# Patient Record
Sex: Female | Born: 1935 | Race: White | Marital: Married | Smoking: Never smoker
Health system: Southern US, Community
[De-identification: ages and names within clinical notes are randomized; demographics above are authoritative.]

## PROBLEM LIST (undated history)

## (undated) DIAGNOSIS — I509 Heart failure, unspecified: Secondary | ICD-10-CM

## (undated) DIAGNOSIS — F0390 Unspecified dementia without behavioral disturbance: Secondary | ICD-10-CM

## (undated) DIAGNOSIS — I4891 Unspecified atrial fibrillation: Secondary | ICD-10-CM

## (undated) HISTORY — PX: ABDOMINAL HYSTERECTOMY: SHX81

---

## 2004-07-10 ENCOUNTER — Emergency Department: Payer: Self-pay | Admitting: Emergency Medicine

## 2004-09-20 ENCOUNTER — Ambulatory Visit: Payer: Self-pay | Admitting: Internal Medicine

## 2005-10-12 ENCOUNTER — Ambulatory Visit: Payer: Self-pay | Admitting: Internal Medicine

## 2006-08-23 ENCOUNTER — Ambulatory Visit: Payer: Self-pay | Admitting: Ophthalmology

## 2006-10-16 ENCOUNTER — Ambulatory Visit: Payer: Self-pay | Admitting: Internal Medicine

## 2006-12-27 ENCOUNTER — Ambulatory Visit: Payer: Self-pay | Admitting: Ophthalmology

## 2011-08-05 ENCOUNTER — Emergency Department: Payer: Self-pay | Admitting: Emergency Medicine

## 2011-08-05 LAB — CBC
HCT: 39.3 % (ref 35.0–47.0)
MCV: 88 fL (ref 80–100)
Platelet: 140 10*3/uL — ABNORMAL LOW (ref 150–440)
RBC: 4.48 10*6/uL (ref 3.80–5.20)
RDW: 13.2 % (ref 11.5–14.5)

## 2011-08-05 LAB — URINALYSIS, COMPLETE
Bilirubin,UR: NEGATIVE
Nitrite: NEGATIVE
Ph: 5 (ref 4.5–8.0)
Protein: NEGATIVE
RBC,UR: 9 /HPF (ref 0–5)
Specific Gravity: 1.014 (ref 1.003–1.030)
WBC UR: 15 /HPF (ref 0–5)

## 2011-08-05 LAB — COMPREHENSIVE METABOLIC PANEL
Albumin: 3.7 g/dL (ref 3.4–5.0)
Alkaline Phosphatase: 79 U/L (ref 50–136)
Anion Gap: 12 (ref 7–16)
BUN: 13 mg/dL (ref 7–18)
Bilirubin,Total: 0.7 mg/dL (ref 0.2–1.0)
Calcium, Total: 8.5 mg/dL (ref 8.5–10.1)
Chloride: 96 mmol/L — ABNORMAL LOW (ref 98–107)
Co2: 28 mmol/L (ref 21–32)
Creatinine: 0.89 mg/dL (ref 0.60–1.30)
EGFR (African American): >60
EGFR (Non-African Amer.): >60
Osmolality: 278 (ref 275–301)
Potassium: 3.1 mmol/L — ABNORMAL LOW (ref 3.5–5.1)
SGPT (ALT): 35 U/L
Sodium: 136 mmol/L (ref 136–145)
Total Protein: 6.8 g/dL (ref 6.4–8.2)

## 2011-08-05 LAB — MAGNESIUM: Magnesium: 1.3 mg/dL — ABNORMAL LOW

## 2011-08-05 LAB — TSH: Thyroid Stimulating Horm: 1.08 u[IU]/mL

## 2011-08-07 LAB — URINE CULTURE

## 2011-09-28 ENCOUNTER — Ambulatory Visit: Payer: Self-pay | Admitting: Cardiology

## 2011-09-28 DIAGNOSIS — I4891 Unspecified atrial fibrillation: Secondary | ICD-10-CM

## 2011-09-28 LAB — BASIC METABOLIC PANEL
Chloride: 100 mmol/L (ref 98–107)
Creatinine: 0.75 mg/dL (ref 0.60–1.30)
EGFR (Non-African Amer.): >60
Glucose: 91 mg/dL (ref 65–99)
Potassium: 4.1 mmol/L (ref 3.5–5.1)
Sodium: 136 mmol/L (ref 136–145)

## 2011-09-28 LAB — CBC WITH DIFFERENTIAL/PLATELET
Basophil #: 0.1 10*3/uL (ref 0.0–0.1)
Basophil %: 1 %
Eosinophil #: 0.1 10*3/uL (ref 0.0–0.7)
HCT: 36.6 % (ref 35.0–47.0)
HGB: 12.2 g/dL (ref 12.0–16.0)
Lymphocyte #: 1.2 10*3/uL (ref 1.0–3.6)
Lymphocyte %: 22.9 %
MCH: 29 pg (ref 26.0–34.0)
Monocyte #: 0.6 x10 3/mm (ref 0.2–0.9)
Monocyte %: 12 %
WBC: 5.3 10*3/uL (ref 3.6–11.0)

## 2011-09-28 LAB — PROTIME-INR: Prothrombin Time: 15.7 secs — ABNORMAL HIGH (ref 11.5–14.7)

## 2011-10-05 ENCOUNTER — Ambulatory Visit: Payer: Self-pay | Admitting: Cardiology

## 2011-10-05 LAB — PROTIME-INR: INR: 1

## 2013-09-11 ENCOUNTER — Emergency Department: Payer: Self-pay | Admitting: Emergency Medicine

## 2014-03-20 DIAGNOSIS — I10 Essential (primary) hypertension: Secondary | ICD-10-CM | POA: Diagnosis present

## 2014-08-31 NOTE — Op Note (Signed)
PATIENT NAME:  Kristen MaclachlanCOOK, [name] MR#:  [id number] DATE OF BIRTH:  1926/09/05  DATE OF PROCEDURE:  10/05/2011  PRIMARY CARE PHYSICIAN: Dr. Bethann PunchesMark Miller   PREPROCEDURE DIAGNOSES: Sick sinus syndrome, atrial fibrillation.   PROCEDURE: Single-chamber pacemaker implantation.   POSTPROCEDURE DIAGNOSIS: Intermittent ventricular pacing.   SURGEON: Marcina MillardAlexander Janyla Biscoe, MD  INDICATION: Patient is an 81 year old female with chronic atrial fibrillation. Patient recently has been experiencing exertional shortness of breath, fatigue, and weakness. The patient was noted to be intermittently bradycardic with atrial fibrillation alternating with atrial fibrillation with rapid ventricular response. Holter monitor has shown bradycardia with rates in the 40s with long RR intervals and pulses up to 2.5 seconds alternating with atrial fibrillation with a rapid rate. Procedure, risks, benefits, and alternatives of permanent pacemaker implantation were explained to the patient and informed written consent was obtained.   DESCRIPTION OF PROCEDURE: She was brought to the Operating Room in a fasting state. The left pectoral region was prepped and draped in the usual sterile manner. Anesthesia was obtained with 1% Xylocaine locally. A 6 cm incision was performed over the left pectoral region. The pacemaker pocket was generated by electrocautery and blunt dissection. Access was obtained to the left subclavian vein by fine needle aspiration. Ventricular and atrial leads were positioned into the right ventricular apical septum and right atrial appendage. After proper thresholds were obtained, the leads were sutured in place. The pacemaker pocket was irrigated with gentamicin solution. The leads were connected to a dual chamber rate responsive pacemaker generator and positioned into the pocket. The pocket was closed with 2-0 and 4-0 Vicryl, respectively. Steri-Strips and pressure dressing were applied.    ____________________________ Marcina MillardAlexander Bevin Mayall, MD ap:cms D: 10/05/2011 13:13:49 ET T: 10/05/2011 13:26:16 ET  JOB#: [id number] cc: Marcina MillardAlexander Dorse Locy, MD, <Dictator> Marcina MillardALEXANDER Kylah Maresh MD ELECTRONICALLY SIGNED 10/20/2011 17:40

## 2018-04-17 DIAGNOSIS — I4891 Unspecified atrial fibrillation: Secondary | ICD-10-CM | POA: Diagnosis present

## 2018-04-17 DIAGNOSIS — F0280 Dementia in other diseases classified elsewhere without behavioral disturbance: Secondary | ICD-10-CM | POA: Diagnosis present

## 2018-04-17 DIAGNOSIS — G309 Alzheimer's disease, unspecified: Secondary | ICD-10-CM | POA: Diagnosis present

## 2018-04-17 DIAGNOSIS — I4892 Unspecified atrial flutter: Secondary | ICD-10-CM | POA: Diagnosis present

## 2018-04-17 DIAGNOSIS — I482 Chronic atrial fibrillation, unspecified: Secondary | ICD-10-CM | POA: Diagnosis present

## 2018-10-17 DIAGNOSIS — Z950 Presence of cardiac pacemaker: Secondary | ICD-10-CM | POA: Diagnosis present

## 2018-10-17 DIAGNOSIS — I739 Peripheral vascular disease, unspecified: Secondary | ICD-10-CM | POA: Diagnosis present

## 2019-11-01 ENCOUNTER — Emergency Department: Payer: Medicare Other

## 2019-11-01 ENCOUNTER — Other Ambulatory Visit: Payer: Self-pay

## 2019-11-01 ENCOUNTER — Emergency Department
Admission: EM | Admit: 2019-11-01 | Discharge: 2019-11-01 | Disposition: A | Discharge status: Home / Self Care | Payer: Medicare Other | Attending: Student in an Organized Health Care Education/Training Program | Admitting: Student in an Organized Health Care Education/Training Program

## 2019-11-01 DIAGNOSIS — Y999 Unspecified external cause status: Secondary | ICD-10-CM | POA: Insufficient documentation

## 2019-11-01 DIAGNOSIS — S0181XA Laceration without foreign body of other part of head, initial encounter: Secondary | ICD-10-CM | POA: Diagnosis not present

## 2019-11-01 DIAGNOSIS — W2209XA Striking against other stationary object, initial encounter: Secondary | ICD-10-CM | POA: Diagnosis not present

## 2019-11-01 DIAGNOSIS — S51012A Laceration without foreign body of left elbow, initial encounter: Secondary | ICD-10-CM | POA: Diagnosis not present

## 2019-11-01 DIAGNOSIS — Y92009 Unspecified place in unspecified non-institutional (private) residence as the place of occurrence of the external cause: Secondary | ICD-10-CM | POA: Diagnosis not present

## 2019-11-01 DIAGNOSIS — Z23 Encounter for immunization: Secondary | ICD-10-CM | POA: Insufficient documentation

## 2019-11-01 DIAGNOSIS — Y939 Activity, unspecified: Secondary | ICD-10-CM | POA: Diagnosis not present

## 2019-11-01 DIAGNOSIS — S0990XA Unspecified injury of head, initial encounter: Secondary | ICD-10-CM | POA: Diagnosis present

## 2019-11-01 MED ORDER — LIDOCAINE HCL (PF) 1 % IJ SOLN
5.0000 mL | Freq: Once | INTRAMUSCULAR | Status: AC
  Administered 2019-11-01: 5 mL via INTRADERMAL
  Filled 2019-11-01: qty 5

## 2019-11-01 MED ORDER — LIDOCAINE-EPINEPHRINE-TETRACAINE (LET) TOPICAL GEL
3.0000 mL | Freq: Once | TOPICAL | Status: AC
  Administered 2019-11-01: 3 mL via TOPICAL
  Filled 2019-11-01: qty 3

## 2019-11-01 MED ORDER — BACITRACIN-NEOMYCIN-POLYMYXIN 400-5-5000 EX OINT
TOPICAL_OINTMENT | Freq: Once | CUTANEOUS | Status: AC
  Administered 2019-11-01: 2 via TOPICAL
  Filled 2019-11-01: qty 2

## 2019-11-01 MED ORDER — TETANUS-DIPHTH-ACELL PERTUSSIS 5-2.5-18.5 LF-MCG/0.5 IM SUSP
0.5000 mL | Freq: Once | INTRAMUSCULAR | Status: AC
Start: 2019-11-01 — End: 2019-11-01
  Administered 2019-11-01: 0.5 mL via INTRAMUSCULAR
  Filled 2019-11-01: qty 0.5

## 2019-11-01 NOTE — ED Triage Notes (Signed)
Pt states that she was moving too fast going from one room to another and fell hitting her head on the floor, denies loc or blood thinner usage, pt has a lac to the left forehead area. Denies neck pain

## 2019-11-01 NOTE — ED Provider Notes (Signed)
[facility] Emergency Department Provider Note    First MD Initiated Contact with Patient 11/01/19 1918     (approximate)  I have reviewed the triage vital signs and the nursing notes.   HISTORY  Chief Complaint Fall, Head Injury, and Laceration    HPI Kristen Osborn is a 86 y.o. female significant past medical history presents to the ER after mechanical fall states that she was trying to move too fast and lost her footing causing her to fall.  She struck her forehead.  No LOC.  Denies any other pain or discomfort.  No neck pain, numbness or tingling.   Not on blood thinners.   No past medical history on file. No family history on file.  There are no problems to display for this patient.     Prior to Admission medications   Not on File    Allergies Patient has no known allergies.    Social History Social History   Tobacco Use  . Smoking status: Not on file  Substance Use Topics  . Alcohol use: Not on file  . Drug use: Not on file    Review of Systems Patient denies headaches, rhinorrhea, blurry vision, numbness, shortness of breath, chest pain, edema, cough, abdominal pain, nausea, vomiting, diarrhea, dysuria, fevers, rashes or hallucinations unless otherwise stated above in HPI. ____________________________________________   PHYSICAL EXAM:  VITAL SIGNS: Vitals:   11/01/19 1657  BP: (!) 197/82  Pulse: 72  Resp: 17  Temp: 97.9 F (36.6 C)  SpO2: 96%    Constitutional: Alert and oriented.  Eyes: Conjunctivae are normal.  Head: 2cm corner laceration above left eyebrow, no FB,  hemostatic Nose: No congestion/rhinnorhea. Mouth/Throat: Mucous membranes are moist.   Neck: No stridor. Painless ROM.  Cardiovascular: Normal rate, regular rhythm. Grossly normal heart sounds.  Good peripheral circulation. Respiratory: Normal respiratory effort.  No retractions. Lungs CTAB. Gastrointestinal: Soft and nontender. No distention. No  abdominal bruits. No CVA tenderness. Genitourinary:  Musculoskeletal: No lower extremity tenderness nor edema.  No joint effusions. Neurologic:  Normal speech and language. No gross focal neurologic deficits are appreciated. No facial droop Skin:  Skin is warm, dry.  0.5cm laceration of the olecranon of the right elbow it is full-thickness.  On careful inspection does not seem to involve joint capsule Psychiatric: Mood and affect are normal. Speech and behavior are normal.  ____________________________________________   LABS (all labs ordered are listed, but only abnormal results are displayed)  No results found for this or any previous visit (from the past 24 hour(s)). ____________________________________________ ____________________________________________  [email or web address]  I personally reviewed all radiographic images ordered to evaluate for the above acute complaints and reviewed radiology reports and findings.  These findings were personally discussed with the patient.  Please see medical record for radiology report.  ____________________________________________   PROCEDURES  Procedure(s) performed:  Marland KitchenMarland KitchenLaceration Repair  Date/Time: 11/01/2019 9:01 PM Performed by: Merlyn Lot, MD Authorized by: Merlyn Lot, MD   Consent:    Consent obtained:  Verbal   Consent given by:  Patient   Risks discussed:  Infection, pain, retained foreign body, poor cosmetic result and poor wound healing Anesthesia (see MAR for exact dosages):    Anesthesia method:  Local infiltration and topical application   Topical anesthetic:  LET   Local anesthetic:  Lidocaine 1% w/o epi Laceration details:    Location: 2cm corner laceration above left eyebrow, 0.5cm lac to right elbow. Repair type:    Repair type:  Simple Exploration:    Hemostasis achieved with:  Direct pressure   Wound exploration: entire depth of wound probed and visualized     Contaminated: no   Treatment:    Area cleansed  with:  Saline and Betadine   Amount of cleaning:  Extensive   Irrigation solution:  Sterile saline   Visualized foreign bodies/material removed: no   Skin repair:    Repair method:  Sutures   Suture size:  5-0   Number of sutures: right elbow: 1.  left forehead: 4. Approximation:    Approximation:  Close Post-procedure details:    Dressing:  Sterile dressing and antibiotic ointment   Patient tolerance of procedure:  Tolerated well, no immediate complications      Critical Care performed: no ____________________________________________   INITIAL IMPRESSION / ASSESSMENT AND PLAN / ED COURSE  Pertinent labs & imaging results that were available during my care of the patient were reviewed by me and considered in my medical decision making (see chart for details).   DDX: laceration, contusion, concussion, ich, fracture  Kristen Osborn is a 86 y.o. who presents to the ED with injuries as described above.  Extensive wound cleaning performed and laceration repaired as above.  Imaging fortunately is reassuring.  History consistent with mechanical fall.  This was witnessed by family member who is currently at bedside.  Tetanus is updated.  Patient well and nontoxic.  Requesting discharge home at that that is appropriate.     The patient was evaluated in Emergency Department today for the symptoms described in the history of present illness. He/she was evaluated in the context of the global COVID-19 pandemic, which necessitated consideration that the patient might be at risk for infection with the SARS-CoV-2 virus that causes COVID-19. Institutional protocols and algorithms that pertain to the evaluation of patients at risk for COVID-19 are in a state of rapid change based on information released by regulatory bodies including the CDC and federal and state organizations. These policies and algorithms were followed during the patient's care in the ED.  As part of my medical decision making, I  reviewed the following data within the electronic MEDICAL RECORD NUMBER Nursing notes reviewed and incorporated, Labs reviewed, notes from prior ED visits and [place] Controlled Substance Database   ____________________________________________   FINAL CLINICAL IMPRESSION(S) / ED DIAGNOSES  Final diagnoses:  Forehead laceration, initial encounter  Elbow laceration, left, initial encounter      NEW MEDICATIONS STARTED DURING THIS VISIT:  New Prescriptions   No medications on file     Note:  This document was prepared using Dragon voice recognition software and may include unintentional dictation errors.    Willy Eddy, MD 11/01/19 2103

## 2020-06-24 ENCOUNTER — Emergency Department: Payer: Medicare Other

## 2020-06-24 ENCOUNTER — Other Ambulatory Visit: Payer: Self-pay

## 2020-06-24 ENCOUNTER — Inpatient Hospital Stay
Admission: EM | Admit: 2020-06-24 | Discharge: 2020-07-06 | DRG: 521 | Disposition: A | Discharge status: Skilled Nursing Facility | Payer: Medicare Other | Attending: Internal Medicine | Admitting: Internal Medicine

## 2020-06-24 DIAGNOSIS — I4891 Unspecified atrial fibrillation: Secondary | ICD-10-CM | POA: Diagnosis present

## 2020-06-24 DIAGNOSIS — I110 Hypertensive heart disease with heart failure: Secondary | ICD-10-CM | POA: Diagnosis present

## 2020-06-24 DIAGNOSIS — Z79899 Other long term (current) drug therapy: Secondary | ICD-10-CM

## 2020-06-24 DIAGNOSIS — Z950 Presence of cardiac pacemaker: Secondary | ICD-10-CM

## 2020-06-24 DIAGNOSIS — Z7901 Long term (current) use of anticoagulants: Secondary | ICD-10-CM

## 2020-06-24 DIAGNOSIS — G309 Alzheimer's disease, unspecified: Secondary | ICD-10-CM | POA: Diagnosis present

## 2020-06-24 DIAGNOSIS — R339 Retention of urine, unspecified: Secondary | ICD-10-CM | POA: Diagnosis not present

## 2020-06-24 DIAGNOSIS — D638 Anemia in other chronic diseases classified elsewhere: Secondary | ICD-10-CM | POA: Diagnosis present

## 2020-06-24 DIAGNOSIS — T8189XA Other complications of procedures, not elsewhere classified, initial encounter: Secondary | ICD-10-CM | POA: Diagnosis not present

## 2020-06-24 DIAGNOSIS — E222 Syndrome of inappropriate secretion of antidiuretic hormone: Secondary | ICD-10-CM | POA: Diagnosis not present

## 2020-06-24 DIAGNOSIS — I480 Paroxysmal atrial fibrillation: Secondary | ICD-10-CM | POA: Diagnosis present

## 2020-06-24 DIAGNOSIS — S72001A Fracture of unspecified part of neck of right femur, initial encounter for closed fracture: Secondary | ICD-10-CM | POA: Diagnosis not present

## 2020-06-24 DIAGNOSIS — S52501A Unspecified fracture of the lower end of right radius, initial encounter for closed fracture: Secondary | ICD-10-CM | POA: Diagnosis present

## 2020-06-24 DIAGNOSIS — Z888 Allergy status to other drugs, medicaments and biological substances status: Secondary | ICD-10-CM

## 2020-06-24 DIAGNOSIS — F0280 Dementia in other diseases classified elsewhere without behavioral disturbance: Secondary | ICD-10-CM | POA: Diagnosis present

## 2020-06-24 DIAGNOSIS — I739 Peripheral vascular disease, unspecified: Secondary | ICD-10-CM | POA: Diagnosis present

## 2020-06-24 DIAGNOSIS — S72011A Unspecified intracapsular fracture of right femur, initial encounter for closed fracture: Principal | ICD-10-CM | POA: Diagnosis present

## 2020-06-24 DIAGNOSIS — Z681 Body mass index (BMI) 19 or less, adult: Secondary | ICD-10-CM

## 2020-06-24 DIAGNOSIS — W1830XA Fall on same level, unspecified, initial encounter: Secondary | ICD-10-CM | POA: Diagnosis present

## 2020-06-24 DIAGNOSIS — I4892 Unspecified atrial flutter: Secondary | ICD-10-CM | POA: Diagnosis present

## 2020-06-24 DIAGNOSIS — E43 Unspecified severe protein-calorie malnutrition: Secondary | ICD-10-CM | POA: Diagnosis present

## 2020-06-24 DIAGNOSIS — T148XXA Other injury of unspecified body region, initial encounter: Secondary | ICD-10-CM

## 2020-06-24 DIAGNOSIS — Y92009 Unspecified place in unspecified non-institutional (private) residence as the place of occurrence of the external cause: Secondary | ICD-10-CM

## 2020-06-24 DIAGNOSIS — Z20822 Contact with and (suspected) exposure to covid-19: Secondary | ICD-10-CM | POA: Diagnosis present

## 2020-06-24 DIAGNOSIS — R54 Age-related physical debility: Secondary | ICD-10-CM | POA: Diagnosis present

## 2020-06-24 DIAGNOSIS — W19XXXA Unspecified fall, initial encounter: Secondary | ICD-10-CM

## 2020-06-24 DIAGNOSIS — I5032 Chronic diastolic (congestive) heart failure: Secondary | ICD-10-CM | POA: Diagnosis present

## 2020-06-24 DIAGNOSIS — R451 Restlessness and agitation: Secondary | ICD-10-CM | POA: Diagnosis present

## 2020-06-24 DIAGNOSIS — D62 Acute posthemorrhagic anemia: Secondary | ICD-10-CM | POA: Diagnosis not present

## 2020-06-24 DIAGNOSIS — I10 Essential (primary) hypertension: Secondary | ICD-10-CM | POA: Diagnosis present

## 2020-06-24 DIAGNOSIS — Z66 Do not resuscitate: Secondary | ICD-10-CM | POA: Diagnosis present

## 2020-06-24 DIAGNOSIS — Z01818 Encounter for other preprocedural examination: Secondary | ICD-10-CM

## 2020-06-24 DIAGNOSIS — I482 Chronic atrial fibrillation, unspecified: Secondary | ICD-10-CM | POA: Diagnosis present

## 2020-06-24 HISTORY — DX: Heart failure, unspecified: I50.9

## 2020-06-24 HISTORY — DX: Unspecified dementia, unspecified severity, without behavioral disturbance, psychotic disturbance, mood disturbance, and anxiety: F03.90

## 2020-06-24 HISTORY — DX: Unspecified atrial fibrillation: I48.91

## 2020-06-24 MED ORDER — DILTIAZEM HCL 25 MG/5ML IV SOLN
10.0000 mg | Freq: Once | INTRAVENOUS | Status: AC
  Administered 2020-06-25: 10 mg via INTRAVENOUS
  Filled 2020-06-24: qty 5

## 2020-06-24 MED ORDER — FENTANYL CITRATE (PF) 100 MCG/2ML IJ SOLN
50.0000 ug | Freq: Once | INTRAMUSCULAR | Status: AC
  Administered 2020-06-25: 50 ug via INTRAVENOUS
  Filled 2020-06-24: qty 2

## 2020-06-24 NOTE — ED Notes (Signed)
Patient 88-92% on RA. Per family member at bedside, does not wear oxygen at home. Placed on 2L via [place] with improvement to 94%.

## 2020-06-24 NOTE — ED Triage Notes (Signed)
Pt is a 87 y/o female presenting via EMS from home with a hx of Dementia presenting after a fall tonight. Per MEDIC, R wrist and R hip deformity noted. administered and relief noted.

## 2020-06-25 ENCOUNTER — Encounter: Payer: Self-pay | Admitting: Emergency Medicine

## 2020-06-25 ENCOUNTER — Emergency Department: Payer: Medicare Other

## 2020-06-25 DIAGNOSIS — E222 Syndrome of inappropriate secretion of antidiuretic hormone: Secondary | ICD-10-CM | POA: Diagnosis not present

## 2020-06-25 DIAGNOSIS — D62 Acute posthemorrhagic anemia: Secondary | ICD-10-CM | POA: Diagnosis not present

## 2020-06-25 DIAGNOSIS — I110 Hypertensive heart disease with heart failure: Secondary | ICD-10-CM | POA: Diagnosis present

## 2020-06-25 DIAGNOSIS — Z01818 Encounter for other preprocedural examination: Secondary | ICD-10-CM

## 2020-06-25 DIAGNOSIS — I5032 Chronic diastolic (congestive) heart failure: Secondary | ICD-10-CM | POA: Diagnosis present

## 2020-06-25 DIAGNOSIS — D638 Anemia in other chronic diseases classified elsewhere: Secondary | ICD-10-CM | POA: Diagnosis present

## 2020-06-25 DIAGNOSIS — Z950 Presence of cardiac pacemaker: Secondary | ICD-10-CM | POA: Diagnosis not present

## 2020-06-25 DIAGNOSIS — F0280 Dementia in other diseases classified elsewhere without behavioral disturbance: Secondary | ICD-10-CM | POA: Diagnosis present

## 2020-06-25 DIAGNOSIS — Y92009 Unspecified place in unspecified non-institutional (private) residence as the place of occurrence of the external cause: Secondary | ICD-10-CM | POA: Diagnosis not present

## 2020-06-25 DIAGNOSIS — G309 Alzheimer's disease, unspecified: Secondary | ICD-10-CM | POA: Diagnosis present

## 2020-06-25 DIAGNOSIS — Z515 Encounter for palliative care: Secondary | ICD-10-CM | POA: Diagnosis not present

## 2020-06-25 DIAGNOSIS — Z681 Body mass index (BMI) 19 or less, adult: Secondary | ICD-10-CM | POA: Diagnosis not present

## 2020-06-25 DIAGNOSIS — Z20822 Contact with and (suspected) exposure to covid-19: Secondary | ICD-10-CM | POA: Diagnosis present

## 2020-06-25 DIAGNOSIS — S72001A Fracture of unspecified part of neck of right femur, initial encounter for closed fracture: Secondary | ICD-10-CM | POA: Insufficient documentation

## 2020-06-25 DIAGNOSIS — R339 Retention of urine, unspecified: Secondary | ICD-10-CM | POA: Diagnosis not present

## 2020-06-25 DIAGNOSIS — W1830XA Fall on same level, unspecified, initial encounter: Secondary | ICD-10-CM | POA: Diagnosis present

## 2020-06-25 DIAGNOSIS — Z7901 Long term (current) use of anticoagulants: Secondary | ICD-10-CM

## 2020-06-25 DIAGNOSIS — I4892 Unspecified atrial flutter: Secondary | ICD-10-CM | POA: Diagnosis present

## 2020-06-25 DIAGNOSIS — S52501A Unspecified fracture of the lower end of right radius, initial encounter for closed fracture: Secondary | ICD-10-CM

## 2020-06-25 DIAGNOSIS — Z79899 Other long term (current) drug therapy: Secondary | ICD-10-CM | POA: Diagnosis not present

## 2020-06-25 DIAGNOSIS — R451 Restlessness and agitation: Secondary | ICD-10-CM | POA: Diagnosis present

## 2020-06-25 DIAGNOSIS — Z66 Do not resuscitate: Secondary | ICD-10-CM | POA: Diagnosis present

## 2020-06-25 DIAGNOSIS — Z888 Allergy status to other drugs, medicaments and biological substances status: Secondary | ICD-10-CM | POA: Diagnosis not present

## 2020-06-25 DIAGNOSIS — I739 Peripheral vascular disease, unspecified: Secondary | ICD-10-CM | POA: Diagnosis present

## 2020-06-25 DIAGNOSIS — Z7189 Other specified counseling: Secondary | ICD-10-CM | POA: Diagnosis not present

## 2020-06-25 DIAGNOSIS — S72011A Unspecified intracapsular fracture of right femur, initial encounter for closed fracture: Secondary | ICD-10-CM | POA: Diagnosis present

## 2020-06-25 DIAGNOSIS — I480 Paroxysmal atrial fibrillation: Secondary | ICD-10-CM | POA: Diagnosis present

## 2020-06-25 DIAGNOSIS — T8189XA Other complications of procedures, not elsewhere classified, initial encounter: Secondary | ICD-10-CM | POA: Diagnosis not present

## 2020-06-25 DIAGNOSIS — R54 Age-related physical debility: Secondary | ICD-10-CM | POA: Diagnosis present

## 2020-06-25 DIAGNOSIS — E43 Unspecified severe protein-calorie malnutrition: Secondary | ICD-10-CM | POA: Diagnosis present

## 2020-06-25 LAB — CBC WITH DIFFERENTIAL/PLATELET
Abs Immature Granulocytes: 0.01 10*3/uL (ref 0.00–0.07)
Basophils Absolute: 0.1 10*3/uL (ref 0.0–0.1)
Basophils Relative: 1 %
Eosinophils Absolute: 0.2 10*3/uL (ref 0.0–0.5)
Eosinophils Relative: 3 %
HCT: 37.1 % (ref 36.0–46.0)
Hemoglobin: 12.3 g/dL (ref 12.0–15.0)
Immature Granulocytes: 0 %
Lymphocytes Relative: 26 %
Lymphs Abs: 1.3 10*3/uL (ref 0.7–4.0)
MCH: 29.4 pg (ref 26.0–34.0)
MCHC: 33.2 g/dL (ref 30.0–36.0)
MCV: 88.5 fL (ref 80.0–100.0)
Monocytes Absolute: 0.6 10*3/uL (ref 0.1–1.0)
Monocytes Relative: 11 %
Neutro Abs: 3 10*3/uL (ref 1.7–7.7)
Neutrophils Relative %: 59 %
Platelets: 140 10*3/uL — ABNORMAL LOW (ref 150–400)
RBC: 4.19 MIL/uL (ref 3.87–5.11)
RDW: 13.9 % (ref 11.5–15.5)
WBC: 5.1 10*3/uL (ref 4.0–10.5)
nRBC: 0 % (ref 0.0–0.2)

## 2020-06-25 LAB — BASIC METABOLIC PANEL
Anion gap: 10 (ref 5–15)
BUN: 21 mg/dL (ref 8–23)
CO2: 25 mmol/L (ref 22–32)
Calcium: 9.1 mg/dL (ref 8.9–10.3)
Chloride: 100 mmol/L (ref 98–111)
Creatinine, Ser: 0.91 mg/dL (ref 0.44–1.00)
GFR, Estimated: 60 mL/min — ABNORMAL LOW (ref >60)
Glucose, Bld: 129 mg/dL — ABNORMAL HIGH (ref 70–99)
Potassium: 3.7 mmol/L (ref 3.5–5.1)
Sodium: 135 mmol/L (ref 135–145)

## 2020-06-25 LAB — URINALYSIS, ROUTINE W REFLEX MICROSCOPIC
Bacteria, UA: NONE SEEN
Bilirubin Urine: NEGATIVE
Glucose, UA: NEGATIVE mg/dL
Ketones, ur: NEGATIVE mg/dL
Leukocytes,Ua: NEGATIVE
Nitrite: NEGATIVE
Protein, ur: 30 mg/dL — AB
Specific Gravity, Urine: 1.013 (ref 1.005–1.030)
Squamous Epithelial / HPF: NONE SEEN (ref 0–5)
pH: 8 (ref 5.0–8.0)

## 2020-06-25 LAB — RESP PANEL BY RT-PCR (FLU A&B, COVID) ARPGX2
Influenza A by PCR: NEGATIVE
Influenza B by PCR: NEGATIVE
SARS Coronavirus 2 by RT PCR: NEGATIVE

## 2020-06-25 LAB — PROTIME-INR
INR: 1.2 (ref 0.8–1.2)
Prothrombin Time: 14.8 seconds (ref 11.4–15.2)

## 2020-06-25 LAB — TYPE AND SCREEN
ABO/RH(D): O POS
Antibody Screen: NEGATIVE

## 2020-06-25 MED ORDER — BISACODYL 5 MG PO TBEC: 5.0000 mg | DELAYED_RELEASE_TABLET | Freq: Every day | ORAL | Status: DC | PRN

## 2020-06-25 MED ORDER — METOPROLOL TARTRATE 25 MG PO TABS
12.5000 mg | ORAL_TABLET | Freq: Two times a day (BID) | ORAL | Status: DC
  Administered 2020-06-25: 12.5 mg via ORAL
  Filled 2020-06-25: qty 1

## 2020-06-25 MED ORDER — HYDRALAZINE HCL 20 MG/ML IJ SOLN
10.0000 mg | Freq: Four times a day (QID) | INTRAMUSCULAR | Status: DC | PRN
  Administered 2020-06-25 – 2020-06-26 (×3): 10 mg via INTRAVENOUS
  Filled 2020-06-25 (×3): qty 1

## 2020-06-25 MED ORDER — HYDROCODONE-ACETAMINOPHEN 5-325 MG PO TABS
1.0000 | ORAL_TABLET | Freq: Four times a day (QID) | ORAL | Status: DC | PRN
  Administered 2020-06-25: 1 via ORAL
  Administered 2020-06-25: 2 via ORAL
  Administered 2020-06-27 – 2020-06-28 (×4): 1 via ORAL
  Administered 2020-07-01: 2 via ORAL
  Administered 2020-07-02 – 2020-07-06 (×4): 1 via ORAL
  Filled 2020-06-25 (×8): qty 1
  Filled 2020-06-25: qty 2
  Filled 2020-06-25: qty 1
  Filled 2020-06-25: qty 2
  Filled 2020-06-25: qty 1

## 2020-06-25 MED ORDER — SENNOSIDES-DOCUSATE SODIUM 8.6-50 MG PO TABS: 1.0000 | ORAL_TABLET | Freq: Every evening | ORAL | Status: DC | PRN

## 2020-06-25 MED ORDER — LOSARTAN POTASSIUM 25 MG PO TABS
25.0000 mg | ORAL_TABLET | Freq: Every day | ORAL | Status: DC
  Administered 2020-06-25 – 2020-07-06 (×11): 25 mg via ORAL
  Filled 2020-06-25 (×11): qty 1

## 2020-06-25 MED ORDER — QUETIAPINE FUMARATE 25 MG PO TABS
25.0000 mg | ORAL_TABLET | Freq: Every day | ORAL | Status: DC
  Administered 2020-06-25 – 2020-06-30 (×6): 25 mg via ORAL
  Filled 2020-06-25 (×6): qty 1

## 2020-06-25 MED ORDER — METOPROLOL TARTRATE 25 MG PO TABS
25.0000 mg | ORAL_TABLET | Freq: Two times a day (BID) | ORAL | Status: DC
  Administered 2020-06-25 – 2020-06-26 (×3): 25 mg via ORAL
  Filled 2020-06-25 (×3): qty 1

## 2020-06-25 MED ORDER — PANTOPRAZOLE SODIUM 40 MG PO TBEC
40.0000 mg | DELAYED_RELEASE_TABLET | Freq: Every day | ORAL | Status: DC
  Administered 2020-06-25 – 2020-07-06 (×11): 40 mg via ORAL
  Filled 2020-06-25 (×11): qty 1

## 2020-06-25 MED ORDER — ADULT MULTIVITAMIN W/MINERALS CH
1.0000 | ORAL_TABLET | Freq: Every day | ORAL | Status: DC
  Administered 2020-06-26 – 2020-07-06 (×9): 1 via ORAL
  Filled 2020-06-25 (×10): qty 1

## 2020-06-25 MED ORDER — ENSURE ENLIVE PO LIQD
237.0000 mL | Freq: Two times a day (BID) | ORAL | Status: DC
  Administered 2020-06-26 – 2020-07-03 (×15): 237 mL via ORAL

## 2020-06-25 MED ORDER — MORPHINE SULFATE (PF) 2 MG/ML IV SOLN
0.5000 mg | INTRAVENOUS | Status: DC | PRN
  Administered 2020-06-25 – 2020-07-02 (×7): 0.5 mg via INTRAVENOUS
  Filled 2020-06-25 (×7): qty 1

## 2020-06-25 NOTE — ED Notes (Signed)
Lab called at this time and states the covid test did not have enough medium to provide results, will re order and resend

## 2020-06-25 NOTE — ED Provider Notes (Addendum)
[facility] Emergency Department Provider Note ____________________________________________   Event Date/Time   First MD Initiated Contact with Patient 06/24/20 2313     (approximate)  I have reviewed the triage vital signs and the nursing notes.   HISTORY  Chief Complaint Fall    HPI Kristen Osborn is a 87 y.o. female with history of dementia, atrial fibrillation on Eliquis, CHF status post pacemaker who presents to the emergency department with EMS after she fell at home just prior to arrival.  Family reports that she becomes agitated and starts to sundown every night.  They state that she tried to go outside to see her husband who is deceased.  Daughter tried to stop her and in the process patient became agitated, turned around quickly and fell onto her right side.  Unclear if she hit her head.  No loss of consciousness.  Complaining of right wrist pain and right hip pain.  Daughter at bedside reports that patient lives at home that her 3 daughters rotate and someone is with her 24/7.  She normally is able to ambulate without assistive devices.  [facility]         Past Medical History:  Diagnosis Date  . Atrial fibrillation (HCC)   . CHF (congestive heart failure) (HCC)   . Dementia [facility](HCC)     Patient Active Problem List   Diagnosis Date Noted  . Chronic anticoagulation 06/25/2020  . Closed displaced fracture of right femoral neck (HCC) 06/25/2020  . Preoperative clearance 06/25/2020  . Distal radius fracture, right 06/25/2020  . PAD (peripheral artery disease) (HCC) 10/17/2018  . Status post placement of cardiac pacemaker 10/17/2018  . Alzheimer's dementia without behavioral disturbance (HCC) 04/17/2018  . Atrial fibrillation and flutter (HCC) 04/17/2018  . Benign essential HTN 03/20/2014    Past Surgical History:  Procedure Laterality Date  . ABDOMINAL HYSTERECTOMY      Prior to Admission medications   Medication Sig Start  Date End Date Taking? Authorizing Provider  Cyanocobalamin 1000 MCG LOZG Place under the tongue.    [provider]  ELIQUIS 5 MG TABS tablet Take 5 mg by mouth 2 (two) times daily. 05/22/20   [provider]  losartan (COZAAR) 25 MG tablet Take 25 mg by mouth daily. 05/05/20   [provider]  metoprolol tartrate (LOPRESSOR) 25 MG tablet Take 12.5 mg by mouth 2 (two) times daily. 04/29/20   [provider]  omeprazole (PRILOSEC) 20 MG capsule Take 20 mg by mouth daily. 04/17/20   [provider]  potassium chloride (KLOR-CON) 8 MEQ tablet Take 8 mEq by mouth daily. 06/12/20   [provider]  QUEtiapine (SEROQUEL) 25 MG tablet SMARTSIG:1 Tablet(s) By Mouth 6 Times a Week 06/16/20   [provider]    Allergies Lisinopril  History reviewed. No pertinent family history.  Social History    Review of Systems Level 5 caveat secondary to dementia   ____________________________________________   PHYSICAL EXAM:  VITAL SIGNS: ED Triage Vitals  Enc Vitals Group     BP 06/24/20 2316 (!) 197/107     Pulse Rate 06/24/20 2316 (!) 105     Resp 06/24/20 2316 16     Temp 06/24/20 2316 98 F (36.7 C)     Temp Source 06/24/20 2316 Oral     SpO2 06/24/20 2316 93 %     Weight 06/24/20 2319 100 lb (45.4 kg)     Height 06/24/20 2319 5\' 6"  (1.676  m)     Head Circumference --      Peak Flow --      Pain Score 06/24/20 2318 4     Pain Loc --      Pain Edu? --      Excl. in GC? --    CONSTITUTIONAL: Alert and oriented to person only.  Elderly.  Appears uncomfortable. HEAD: Normocephalic; atraumatic EYES: Conjunctivae clear, PERRL, EOMI ENT: normal nose; no rhinorrhea; moist mucous membranes; pharynx without lesions noted; no dental injury; no septal hematoma NECK: Supple, no meningismus, no LAD; no midline spinal tenderness, step-off or deformity; trachea midline CARD: Irregularly irregular and tachycardic; S1 and S2 appreciated; no  murmurs, no clicks, no rubs, no gallops RESP: Normal chest excursion without splinting or tachypnea; breath sounds clear and equal bilaterally; no wheezes, no rhonchi, no rales; intermittently hypoxic to the upper 80s CHEST:  chest wall stable, no crepitus or ecchymosis or deformity, nontender to palpation; no flail chest ABD/GI: Normal bowel sounds; non-distended; soft, non-tender, no rebound, no guarding; no ecchymosis or other lesions noted PELVIS:  stable, nontender to palpation BACK:  The back appears normal and is non-tender to palpation, there is no CVA tenderness; no midline spinal tenderness, step-off or deformity EXT: Tender to palpation over the right hip but no leg length discrepancy.  2+ radial and DP pulses bilaterally.  Tender over the right wrist and right dorsal hand with soft tissue swelling.  Compartments in the extremities are soft.  Extremities are warm and well-perfused. SKIN: Normal color for age and race; warm NEURO: Moves all extremities equally, normal speech, no facial asymmetry PSYCH: The patient's mood and manner are appropriate. Grooming and personal hygiene are appropriate.  ____________________________________________   LABS (all labs ordered are listed, but only abnormal results are displayed)  Labs Reviewed  CBC WITH DIFFERENTIAL/PLATELET - Abnormal; Notable for the following components:      Result Value   Platelets 140 (*)    All other components within normal limits  BASIC METABOLIC PANEL - Abnormal; Notable for the following components:   Glucose, Bld 129 (*)    GFR, Estimated 60 (*)    All other components within normal limits  RESP PANEL BY RT-PCR (FLU A&B, COVID) ARPGX2  PROTIME-INR  URINALYSIS, ROUTINE W REFLEX MICROSCOPIC  CBG MONITORING, ED  TYPE AND SCREEN   ____________________________________________  EKG   EKG Interpretation  Date/Time:  Wednesday June 24 2020 23:16:04 EST Ventricular Rate:  127 PR Interval:    QRS  Duration: 93 QT Interval:  331 QTC Calculation: 497 R Axis:   -43 Text Interpretation: Atrial fibrillation Left ventricular hypertrophy Probable anterior infarct, age indeterminate Lateral leads are also involved Confirmed by Rochele Raring [phone]) on 06/25/2020 12:08:30 AM       ____________________________________________  RADIOLOGY Normajean Baxter Ward, personally viewed and evaluated these images (plain radiographs) as part of my medical decision making, as well as reviewing the written report by the radiologist.  ED MD interpretation: Right distal radius fracture.  Right femoral neck fracture.  Official radiology report(s): DG Wrist Complete Right  Result Date: 06/25/2020 CLINICAL DATA:  Post fall with right wrist deformity. EXAM: RIGHT WRIST - COMPLETE 3+ VIEW COMPARISON:  None. FINDINGS: Impacted displaced distal radial metaphysis fracture. There is apex volar angulation. There is no definite radiocarpal joint involvement. There is no fracture involvement of the distal radioulnar joint, however the joint is offset. Degenerative cystic changes in the ulna styloid. No carpal bone fracture. IMPRESSION: Impacted and displaced  distal radial metaphysis fracture with apex volar angulation. Disruption of the distal radioulnar joint. Electronically Signed   By: Narda Rutherford M.D.   On: 06/25/2020 00:11   CT Head Wo Contrast  Result Date: 06/25/2020 CLINICAL DATA:  87 year old post fall tonight. EXAM: CT HEAD WITHOUT CONTRAST TECHNIQUE: Contiguous axial images were obtained from the base of the skull through the vertex without intravenous contrast. COMPARISON:  Head CT 11/01/2019 FINDINGS: Brain: Despite repeat acquisition, there is motion artifact limitations. Stable degree of generalized atrophy and chronic small vessel ischemia. No evidence of intracranial hemorrhage, mass effect, or midline shift. No hydrocephalus. The basilar cisterns are patent. No evidence of territorial infarct or acute  ischemia. No extra-axial or intracranial fluid collection. Vascular: Atherosclerosis of skullbase vasculature without hyperdense vessel or abnormal calcification. Skull: No fracture or focal lesion. Sinuses/Orbits: No acute findings.  Bilateral cataract resection. Other: None. IMPRESSION: 1. No acute intracranial abnormality. No skull fracture. 2. Stable atrophy and chronic small vessel ischemia. Electronically Signed   By: Narda Rutherford M.D.   On: 06/25/2020 01:10   CT Cervical Spine Wo Contrast  Result Date: 06/25/2020 CLINICAL DATA:  Neck trauma (Age >= 68y) 87 year old post fall. EXAM: CT CERVICAL SPINE WITHOUT CONTRAST TECHNIQUE: Multidetector CT imaging of the cervical spine was performed without intravenous contrast. Multiplanar CT image reconstructions were also generated. COMPARISON:  None. FINDINGS: Alignment: 4 mm anterolisthesis of C2 on C3, 3 mm anterolisthesis C3 on C4, 4 mm anterolisthesis C4 on C5, and 3 mm anterolisthesis C7 on T1. No traumatic subluxation, no jumped or perched facets. Skull base and vertebrae: No acute fracture. The dens and skull base are intact. Soft tissues and spinal canal: No prevertebral fluid or swelling. No visible canal hematoma. Disc levels: Diffuse degenerative disc disease, most prominent at C5-C6 and C4-C5. There is multilevel facet hypertrophy. Upper chest: Apical septal thickening consistent with pulmonary edema. Other: Carotid calcifications. IMPRESSION: 1. Multilevel degenerative disc disease and facet hypertrophy throughout the cervical spine without acute fracture or subluxation. 2. Multilevel anterolisthesis, typically degenerative. 3. Pulmonary edema at the lung apices. Electronically Signed   By: Narda Rutherford M.D.   On: 06/25/2020 01:14   DG Chest Portable 1 View  Result Date: 06/25/2020 CLINICAL DATA:  Fall tonight with right wrist and right hip deformity. EXAM: PORTABLE CHEST 1 VIEW COMPARISON:  Radiograph 10/05/2011 FINDINGS: Single lead  left-sided pacemaker in place. Cardiomegaly is grossly stable. Unchanged mediastinal contours. Dense aortic atherosclerosis. No focal airspace disease, pneumothorax or large pleural effusion. Soft tissue density projecting over the left lung base is unchanged from prior and likely soft tissue attenuation. Mild vascular congestion. No acute osseous abnormalities are seen. IMPRESSION: 1. No acute traumatic process. 2. Mild vascular congestion. Stable cardiomegaly. Aortic Atherosclerosis (ICD10-I70.0). Electronically Signed   By: Narda Rutherford M.D.   On: 06/25/2020 00:14   DG Hand Complete Right  Result Date: 06/25/2020 CLINICAL DATA:  Fall tonight.  Right wrist deformity. EXAM: RIGHT HAND - COMPLETE 3+ VIEW COMPARISON:  None. FINDINGS: Distal radius fracture is better assessed on concurrent wrist exam. There is no additional fracture of the hand. There is ulnar subluxation of the fifth digit at the proximal interphalangeal joint that appears chronic. Multifocal osteoarthritis, primarily throughout the digits. Soft tissue prominence at the second through fourth proximal interphalangeal joints, likely Bouchard's nodes. IMPRESSION: 1. Distal radius fracture better assessed on concurrent wrist exam. No additional fracture of the hand. 2. Multifocal osteoarthritis, primarily throughout the digits. Electronically Signed   By: Shawna Orleans  Sanford M.D.   On: 06/25/2020 00:10   DG Hip Unilat W or Wo Pelvis 2-3 Views Right  Result Date: 06/25/2020 CLINICAL DATA:  Post fall with right hip deformity. EXAM: DG HIP (WITH OR WITHOUT PELVIS) 2-3V RIGHT COMPARISON:  None. FINDINGS: There is a displaced right femoral neck fracture. Proximal migration of the femoral shaft. The femoral head remains seated. Pubic rami are intact. Bones are under mineralized. IMPRESSION: Displaced right femoral neck fracture. Electronically Signed   By: Narda Rutherford M.D.   On: 06/25/2020 00:12     ____________________________________________   PROCEDURES  Procedure(s) performed (including Critical Care):  Procedures   SPLINT APPLICATION Authorized by: Baxter Hire Ward Consent: Verbal consent obtained. Risks and benefits: risks, benefits and alternatives were discussed Consent given by: patient Splint applied by:  technician Location details: right arm Splint type: sugar tong Supplies used: orthoglass Post-procedure: The splinted body part was neurovascularly unchanged following the procedure. Patient tolerance: Patient tolerated the procedure well with no immediate complications.    ____________________________________________   INITIAL IMPRESSION / ASSESSMENT AND PLAN / ED COURSE  As part of my medical decision making, I reviewed the following data within the electronic MEDICAL RECORD NUMBER History obtained from family, Nursing notes reviewed and incorporated, Labs reviewed, EKG interpreted atrial fibrillation, Old EKG reviewed, Old chart reviewed, Discussed with admitting physician Dr. Para March, A consult was requested and obtained from this/these consultant(s) Orthopedics and Notes from prior ED visits         Patient here with what sounds like a mechanical fall.  Complaining of right hip pain and has soft tissue swelling noted to the right wrist and hand.  Unclear if she hit her head and she is on Eliquis.  Will obtain CT of the head and cervical spine, x-rays of the right hip, hand and wrist.  Will also obtain a chest x-ray given she is intermittently hypoxic but this may be secondary to receiving 100 mcg of fentanyl with EMS.  Will obtain labs, urine.  EKG shows that she is in atrial fibrillation with RVR.  May be secondary to pain.  Will give diltiazem bolus and monitor closely.  Daughter at bedside has been updated with plan.  ED PROGRESS  1:20 AM  Pt's CT head and cervical spine showed no acute injury.  X-ray of the right wrist shows displaced, impacted distal right  radius fracture.  X-ray of the hip shows displaced right femoral neck fracture.  Chest x-ray shows vascular congestion without overt edema.  Heart rate currently in the 70s still in A. fib but rate controlled after diltiazem bolus.  Labs reviewed and unremarkable.  Will discuss with orthopedics for further recommendations.  1:25 AM  Spoke with Dr. Odis Luster with orthopedic surgery who will see patient in consultation.  Patient would not be going to surgery today given last dose of Eliquis was morning of 2/16.  Agrees with sugar tong splint for distal radius fracture.  Will discuss with medicine for admission.   1:36 AM Discussed patient's case with hospitalist, Dr. Para March.  I have recommended admission and patient (and family if present) agree with this plan. Admitting physician will place admission orders.   I reviewed all nursing notes, vitals, pertinent previous records and reviewed/interpreted all EKGs, lab and urine results, imaging (as available).     ____________________________________________   FINAL CLINICAL IMPRESSION(S) / ED DIAGNOSES  Final diagnoses:  Fall, initial encounter  Closed fracture of distal end of right radius, unspecified fracture morphology, initial encounter  Closed  displaced fracture of right femoral neck [facility])     ED Discharge Orders    None      *Please note:  Kristen Osborn was evaluated in Emergency Department on 06/25/2020 for the symptoms described in the history of present illness. She was evaluated in the context of the global COVID-19 pandemic, which necessitated consideration that the patient might be at risk for infection with the SARS-CoV-2 virus that causes COVID-19. Institutional protocols and algorithms that pertain to the evaluation of patients at risk for COVID-19 are in a state of rapid change based on information released by regulatory bodies including the CDC and federal and state organizations. These policies and algorithms were followed  during the patient's care in the ED.  Some ED evaluations and interventions may be delayed as a result of limited staffing during and the pandemic.*   Note:  This document was prepared using Dragon voice recognition software and may include unintentional dictation errors.   Ward, Layla Maw, DO 06/25/20 0138    Ward, Layla Maw, DO 06/25/20 [phone]

## 2020-06-25 NOTE — Consult Note (Addendum)
ORTHOPAEDIC CONSULTATION  REQUESTING PHYSICIAN: Lynn Ito, MD  Chief Complaint: right hip and wrist pain  HPI: Kristen Osborn is a 87 y.o. female who complains of hip and wrist pain after fall. Please see H&P and ED notes for details. Denies any numbness, tingling or constitutional symptoms. A full history was unable to be obtained due to patients dementia.  Past Medical History:  Diagnosis Date   Atrial fibrillation (HCC)    CHF (congestive heart failure) (HCC)    Dementia (HCC)    Past Surgical History:  Procedure Laterality Date   ABDOMINAL HYSTERECTOMY     Social History   Socioeconomic History   Marital status: Married    Spouse name: Not on file   Number of children: Not on file   Years of education: Not on file   Highest education level: Not on file  Occupational History   Not on file  Tobacco Use   Smoking status: Not on file   Smokeless tobacco: Not on file  Substance and Sexual Activity   Alcohol use: Not on file   Drug use: Not on file   Sexual activity: Not on file  Other Topics Concern   Not on file  Social History Narrative   Not on file   Social Determinants of Health   Financial Resource Strain: Not on file  Food Insecurity: Not on file  Transportation Needs: Not on file  Physical Activity: Not on file  Stress: Not on file  Social Connections: Not on file   History reviewed. No pertinent family history. Allergies  Allergen Reactions   Lisinopril Other (See Comments)   Prior to Admission medications   Medication Sig Start Date End Date Taking? Authorizing Provider  Cyanocobalamin 1000 MCG LOZG Place 1 lozenge under the tongue daily.   Yes [provider]  ELIQUIS 5 MG TABS tablet Take 5 mg by mouth 2 (two) times daily. 05/22/20  Yes [provider]  losartan (COZAAR) 25 MG tablet Take 25 mg by mouth daily. 05/05/20  Yes [provider]  metoprolol tartrate (LOPRESSOR) 25 MG tablet Take 12.5 mg by mouth 2 (two)  times daily. 04/29/20  Yes [provider]  omeprazole (PRILOSEC) 20 MG capsule Take 20 mg by mouth daily. 04/17/20  Yes [provider]  potassium chloride (KLOR-CON) 8 MEQ tablet Take 8 mEq by mouth daily. 06/12/20  Yes [provider]  QUEtiapine (SEROQUEL) 25 MG tablet Take 25 mg by mouth daily. Takes at 5 pm 06/16/20  Yes [provider]   DG Wrist Complete Right  Result Date: 06/25/2020 CLINICAL DATA:  Post fall with right wrist deformity. EXAM: RIGHT WRIST - COMPLETE 3+ VIEW COMPARISON:  None. FINDINGS: Impacted displaced distal radial metaphysis fracture. There is apex volar angulation. There is no definite radiocarpal joint involvement. There is no fracture involvement of the distal radioulnar joint, however the joint is offset. Degenerative cystic changes in the ulna styloid. No carpal bone fracture. IMPRESSION: Impacted and displaced distal radial metaphysis fracture with apex volar angulation. Disruption of the distal radioulnar joint. Electronically Signed   By: Narda Rutherford M.D.   On: 06/25/2020 00:11   CT Head Wo Contrast  Result Date: 06/25/2020 CLINICAL DATA:  87 year old post fall tonight. EXAM: CT HEAD WITHOUT CONTRAST TECHNIQUE: Contiguous axial images were obtained from the base of the skull through the vertex without intravenous contrast. COMPARISON:  Head CT 11/01/2019 FINDINGS: Brain: Despite repeat acquisition, there is motion artifact limitations. Stable degree of generalized atrophy  and chronic small vessel ischemia. No evidence of intracranial hemorrhage, mass effect, or midline shift. No hydrocephalus. The basilar cisterns are patent. No evidence of territorial infarct or acute ischemia. No extra-axial or intracranial fluid collection. Vascular: Atherosclerosis of skullbase vasculature without hyperdense vessel or abnormal calcification. Skull: No fracture or focal lesion. Sinuses/Orbits: No acute findings.  Bilateral cataract resection.  Other: None. IMPRESSION: 1. No acute intracranial abnormality. No skull fracture. 2. Stable atrophy and chronic small vessel ischemia. Electronically Signed   By: Narda Rutherford M.D.   On: 06/25/2020 01:10   CT Cervical Spine Wo Contrast  Result Date: 06/25/2020 CLINICAL DATA:  Neck trauma (Age >= 36y) 87 year old post fall. EXAM: CT CERVICAL SPINE WITHOUT CONTRAST TECHNIQUE: Multidetector CT imaging of the cervical spine was performed without intravenous contrast. Multiplanar CT image reconstructions were also generated. COMPARISON:  None. FINDINGS: Alignment: 4 mm anterolisthesis of C2 on C3, 3 mm anterolisthesis C3 on C4, 4 mm anterolisthesis C4 on C5, and 3 mm anterolisthesis C7 on T1. No traumatic subluxation, no jumped or perched facets. Skull base and vertebrae: No acute fracture. The dens and skull base are intact. Soft tissues and spinal canal: No prevertebral fluid or swelling. No visible canal hematoma. Disc levels: Diffuse degenerative disc disease, most prominent at C5-C6 and C4-C5. There is multilevel facet hypertrophy. Upper chest: Apical septal thickening consistent with pulmonary edema. Other: Carotid calcifications. IMPRESSION: 1. Multilevel degenerative disc disease and facet hypertrophy throughout the cervical spine without acute fracture or subluxation. 2. Multilevel anterolisthesis, typically degenerative. 3. Pulmonary edema at the lung apices. Electronically Signed   By: Narda Rutherford M.D.   On: 06/25/2020 01:14   DG Chest Portable 1 View  Result Date: 06/25/2020 CLINICAL DATA:  Fall tonight with right wrist and right hip deformity. EXAM: PORTABLE CHEST 1 VIEW COMPARISON:  Radiograph 10/05/2011 FINDINGS: Single lead left-sided pacemaker in place. Cardiomegaly is grossly stable. Unchanged mediastinal contours. Dense aortic atherosclerosis. No focal airspace disease, pneumothorax or large pleural effusion. Soft tissue density projecting over the left lung base is unchanged from  prior and likely soft tissue attenuation. Mild vascular congestion. No acute osseous abnormalities are seen. IMPRESSION: 1. No acute traumatic process. 2. Mild vascular congestion. Stable cardiomegaly. Aortic Atherosclerosis (ICD10-I70.0). Electronically Signed   By: Narda Rutherford M.D.   On: 06/25/2020 00:14   DG Hand Complete Right  Result Date: 06/25/2020 CLINICAL DATA:  Fall tonight.  Right wrist deformity. EXAM: RIGHT HAND - COMPLETE 3+ VIEW COMPARISON:  None. FINDINGS: Distal radius fracture is better assessed on concurrent wrist exam. There is no additional fracture of the hand. There is ulnar subluxation of the fifth digit at the proximal interphalangeal joint that appears chronic. Multifocal osteoarthritis, primarily throughout the digits. Soft tissue prominence at the second through fourth proximal interphalangeal joints, likely Bouchard's nodes. IMPRESSION: 1. Distal radius fracture better assessed on concurrent wrist exam. No additional fracture of the hand. 2. Multifocal osteoarthritis, primarily throughout the digits. Electronically Signed   By: Narda Rutherford M.D.   On: 06/25/2020 00:10   DG Hip Unilat W or Wo Pelvis 2-3 Views Right  Result Date: 06/25/2020 CLINICAL DATA:  Post fall with right hip deformity. EXAM: DG HIP (WITH OR WITHOUT PELVIS) 2-3V RIGHT COMPARISON:  None. FINDINGS: There is a displaced right femoral neck fracture. Proximal migration of the femoral shaft. The femoral head remains seated. Pubic rami are intact. Bones are under mineralized. IMPRESSION: Displaced right femoral neck fracture. Electronically Signed   By: Ivette Loyal.D.  On: 06/25/2020 00:12    Positive ROS: All other systems have been reviewed and were otherwise negative with the exception of those mentioned in the HPI and as above.  Physical Exam: General: Alert, no acute distress Cardiovascular: No pedal edema Respiratory: No cyanosis, no use of accessory musculature GI: No organomegaly,  abdomen is soft and non-tender Skin: No lesions in the area of chief complaint Neurologic: Sensation intact distally Psychiatric: Patient is competent for consent with normal mood and affect Lymphatic: No axillary or cervical lymphadenopathy  MUSCULOSKELETAL: RLE is short, externally rotated RUE with obvious deformity and dorsal swelling. Compartments soft. Good cap refill. Motor and sensory intact distally.  Assessment: Right femoral neck fracture, closed, displaced Right distal radius fracture  Plan: Plan a right hip hemiarthroplasty when cleared for spinal anesthesia, likely on Sunday. Will also perform closed reduction of her right wrist. Apply ice and elevate the hand. Plan of care is discussed with her daughter.  The diagnosis, risks, benefits and alternatives to treatment are all discussed in detail with the patient and family. Risks include but are not limited to bleeding, infection, deep vein thrombosis, pulmonary embolism, nerve or vascular injury, non-union, repeat operation, persistent pain, weakness, stiffness and death. Her daughter understands and is eager to proceed.     Lyndle Herrlich, MD    06/25/2020 6:11 PM

## 2020-06-25 NOTE — H&P (Addendum)
History and Physical    CAIDENCE Osborn LXB:[id number] DOB: [birth year] DOA: 06/24/2020  PCP: Danella Penton, MD   Patient coming from: home  I have personally briefly reviewed patient's old medical records in [facility]  Chief Complaint: Fall  HPI: Kristen Osborn is a 87 y.o. female with medical history significant for atrial fibrillation on Eliquis, diastolic heart failure, dementia, HTN, PAD pacemaker placement, was brought to the emergency room following an accidental fall.  Most of the history is given by her daughter at the bedside due to history of dementia.  Patient lives alone and is cared for by her 3 daughters who rotate staying with her to provide 24/7 care.  According to daughter at bedside, another daughter who was caring for her at the time was attempting to leave her out of the room when her mother pulled away saying she was going to see her husband-she pulled away she fell onto her right side.  She did not hit her head and did not lose consciousness.  Patient was previously in her usual state of health with no recent illnesses.  She had no recent fever or chills, cough, chest pain, shortness of breath, abdominal pain, nausea or vomiting.  No constipation or dysuria.  Appetite and activity was at baseline.  She did complain of pain in her right arm and leg following the fall. ED Course: On arrival, BP 197/107, pulse 105, temp 98, O2 sats 93% on room air.  Blood work unremarkable.  UA pending EKG as reviewed by me :.  Atrial fibrillation at 127 Imaging: X-ray right hip: Displaced right femoral neck fracture X-ray right wrist: Distal radius fracture CT head and C-spine: No acute fracture, or acute abnormality Chest x-ray mild vascular congestion  Patient was treated with a single diltiazem bolus 10 mg in the ER with rate control to A. fib at 70 The ED provider spoke with orthopedist, Dr. Odis Luster who advised to place patient in splint right wrist with plans to do hip repair on  2/18 given last Eliquis dose in the a.m. of 2/16 Hospitalist consulted for admission.  Review of Systems: Unable to obtain due to history of dementia  Past Medical History:  Diagnosis Date  . Atrial fibrillation (HCC)   . CHF (congestive heart failure) (HCC)   . Dementia [facility])     Past Surgical History:  Procedure Laterality Date  . ABDOMINAL HYSTERECTOMY       has no history on file for tobacco use, alcohol use, and drug use.  Allergies  Allergen Reactions  . Lisinopril Other (See Comments)    History reviewed. No pertinent family history.    Prior to Admission medications   Medication Sig Start Date End Date Taking? Authorizing Provider  Cyanocobalamin 1000 MCG LOZG Place 1 lozenge under the tongue daily.   Yes [provider]  ELIQUIS 5 MG TABS tablet Take 5 mg by mouth 2 (two) times daily. 05/22/20  Yes [provider]  losartan (COZAAR) 25 MG tablet Take 25 mg by mouth daily. 05/05/20  Yes [provider]  metoprolol tartrate (LOPRESSOR) 25 MG tablet Take 12.5 mg by mouth 2 (two) times daily. 04/29/20  Yes [provider]  omeprazole (PRILOSEC) 20 MG capsule Take 20 mg by mouth daily. 04/17/20  Yes [provider]  potassium chloride (KLOR-CON) 8 MEQ tablet Take 8 mEq by mouth daily. 06/12/20  Yes [provider]  QUEtiapine (SEROQUEL) 25 MG tablet Take 25 mg by mouth daily.  Takes at 5 pm 06/16/20  Yes [provider]    Physical Exam: Vitals:   06/24/20 2316 06/24/20 2319 06/24/20 2330 06/25/20 0127  BP: (!) 197/107  (!) 179/128 (!) 164/88  Pulse: (!) 105  100 88  Resp: 16  11 18   Temp: 98 F (36.7 C)     TempSrc: Oral     SpO2: 93%  97% 96%  Weight:  45.4 kg    Height:  5\' 6"  (1.676 m)       Vitals:   06/24/20 2316 06/24/20 2319 06/24/20 2330 06/25/20 0127  BP: (!) 197/107  (!) 179/128 (!) 164/88  Pulse: (!) 105  100 88  Resp: 16  11 18   Temp: 98 F (36.7 C)     TempSrc: Oral     SpO2: 93%   97% 96%  Weight:  45.4 kg    Height:  5\' 6"  (1.676 m)        Constitutional:  Drowsy but easily arousable and oriented x 1 . Not in any apparent distress HEENT:      Head: Normocephalic and atraumatic.         Eyes: PERLA, EOMI, Conjunctivae are normal. Sclera is non-icteric.       Mouth/Throat: Mucous membranes are moist.       Neck: Supple with no signs of meningismus. Cardiovascular:  Irregularly irregular. No murmurs, gallops, or rubs. 2+ symmetrical distal pulses are present . No JVD. No  LE edema Respiratory: Respiratory effort normal .Lungs sounds clear  bilaterally. No  wheezes, crackles, or rhonchi.  Gastrointestinal: Soft, non tender, and non distended with positive bowel sounds.  Genitourinary: No CVA tenderness. Musculoskeletal:  Right forearm in splint.  Pain on palpation right hip and right leg shortened and and external rotation.. No cyanosis, or erythema of extremities. Neurologic:  Face is symmetric. Moving all extremities. No gross focal neurologic deficits . Skin: Skin is warm, dry.  No rash or ulcers Psychiatric: Mood and affect are normal    Labs on Admission: I have personally reviewed following labs and imaging studies  CBC: Recent Labs  Lab 06/25/20 0022  WBC 5.1  NEUTROABS 3.0  HGB 12.3  HCT 37.1  MCV 88.5  PLT 140*   Basic Metabolic Panel: Recent Labs  Lab 06/25/20 0022  NA 135  K 3.7  CL 100  CO2 25  GLUCOSE 129*  BUN 21  CREATININE 0.91  CALCIUM 9.1   GFR: Estimated Creatinine Clearance: 28.9 mL/min (by C-G formula based on SCr of 0.91 mg/dL). Liver Function Tests: No results for input(s): AST, ALT, ALKPHOS, BILITOT, PROT, ALBUMIN in the last 168 hours. No results for input(s): LIPASE, AMYLASE in the last 168 hours. No results for input(s): AMMONIA in the last 168 hours. Coagulation Profile: Recent Labs  Lab 06/25/20 0022  INR 1.2   Cardiac Enzymes: No results for input(s): CKTOTAL, CKMB, CKMBINDEX, TROPONINI in the last  168 hours. BNP (last 3 results) No results for input(s): PROBNP in the last 8760 hours. HbA1C: No results for input(s): HGBA1C in the last 72 hours. CBG: No results for input(s): GLUCAP in the last 168 hours. Lipid Profile: No results for input(s): CHOL, HDL, LDLCALC, TRIG, CHOLHDL, LDLDIRECT in the last 72 hours. Thyroid Function Tests: No results for input(s): TSH, T4TOTAL, FREET4, T3FREE, THYROIDAB in the last 72 hours. Anemia Panel: No results for input(s): VITAMINB12, FOLATE, FERRITIN, TIBC, IRON, RETICCTPCT in the last 72 hours. Urine analysis:    Component Value Date/Time  COLORURINE Yellow 08/05/2011 1551   APPEARANCEUR Hazy 08/05/2011 1551   LABSPEC 1.014 08/05/2011 1551   PHURINE 5.0 08/05/2011 1551   GLUCOSEU Negative 08/05/2011 1551   HGBUR 2+ 08/05/2011 1551   BILIRUBINUR Negative 08/05/2011 1551   KETONESUR Negative 08/05/2011 1551   PROTEINUR Negative 08/05/2011 1551   NITRITE Negative 08/05/2011 1551   LEUKOCYTESUR 3+ 08/05/2011 1551    Radiological Exams on Admission: DG Wrist Complete Right  Result Date: 06/25/2020 CLINICAL DATA:  Post fall with right wrist deformity. EXAM: RIGHT WRIST - COMPLETE 3+ VIEW COMPARISON:  None. FINDINGS: Impacted displaced distal radial metaphysis fracture. There is apex volar angulation. There is no definite radiocarpal joint involvement. There is no fracture involvement of the distal radioulnar joint, however the joint is offset. Degenerative cystic changes in the ulna styloid. No carpal bone fracture. IMPRESSION: Impacted and displaced distal radial metaphysis fracture with apex volar angulation. Disruption of the distal radioulnar joint. Electronically Signed   By: Narda Rutherford M.D.   On: 06/25/2020 00:11   CT Head Wo Contrast  Result Date: 06/25/2020 CLINICAL DATA:  87 year old post fall tonight. EXAM: CT HEAD WITHOUT CONTRAST TECHNIQUE: Contiguous axial images were obtained from the base of the skull through the vertex  without intravenous contrast. COMPARISON:  Head CT 11/01/2019 FINDINGS: Brain: Despite repeat acquisition, there is motion artifact limitations. Stable degree of generalized atrophy and chronic small vessel ischemia. No evidence of intracranial hemorrhage, mass effect, or midline shift. No hydrocephalus. The basilar cisterns are patent. No evidence of territorial infarct or acute ischemia. No extra-axial or intracranial fluid collection. Vascular: Atherosclerosis of skullbase vasculature without hyperdense vessel or abnormal calcification. Skull: No fracture or focal lesion. Sinuses/Orbits: No acute findings.  Bilateral cataract resection. Other: None. IMPRESSION: 1. No acute intracranial abnormality. No skull fracture. 2. Stable atrophy and chronic small vessel ischemia. Electronically Signed   By: Narda Rutherford M.D.   On: 06/25/2020 01:10   CT Cervical Spine Wo Contrast  Result Date: 06/25/2020 CLINICAL DATA:  Neck trauma (Age >= 92y) 87 year old post fall. EXAM: CT CERVICAL SPINE WITHOUT CONTRAST TECHNIQUE: Multidetector CT imaging of the cervical spine was performed without intravenous contrast. Multiplanar CT image reconstructions were also generated. COMPARISON:  None. FINDINGS: Alignment: 4 mm anterolisthesis of C2 on C3, 3 mm anterolisthesis C3 on C4, 4 mm anterolisthesis C4 on C5, and 3 mm anterolisthesis C7 on T1. No traumatic subluxation, no jumped or perched facets. Skull base and vertebrae: No acute fracture. The dens and skull base are intact. Soft tissues and spinal canal: No prevertebral fluid or swelling. No visible canal hematoma. Disc levels: Diffuse degenerative disc disease, most prominent at C5-C6 and C4-C5. There is multilevel facet hypertrophy. Upper chest: Apical septal thickening consistent with pulmonary edema. Other: Carotid calcifications. IMPRESSION: 1. Multilevel degenerative disc disease and facet hypertrophy throughout the cervical spine without acute fracture or subluxation.  2. Multilevel anterolisthesis, typically degenerative. 3. Pulmonary edema at the lung apices. Electronically Signed   By: Narda Rutherford M.D.   On: 06/25/2020 01:14   DG Chest Portable 1 View  Result Date: 06/25/2020 CLINICAL DATA:  Fall tonight with right wrist and right hip deformity. EXAM: PORTABLE CHEST 1 VIEW COMPARISON:  Radiograph 10/05/2011 FINDINGS: Single lead left-sided pacemaker in place. Cardiomegaly is grossly stable. Unchanged mediastinal contours. Dense aortic atherosclerosis. No focal airspace disease, pneumothorax or large pleural effusion. Soft tissue density projecting over the left lung base is unchanged from prior and likely soft tissue attenuation. Mild vascular congestion. No acute  osseous abnormalities are seen. IMPRESSION: 1. No acute traumatic process. 2. Mild vascular congestion. Stable cardiomegaly. Aortic Atherosclerosis (ICD10-I70.0). Electronically Signed   By: Narda RutherfordMelanie  Sanford M.D.   On: 06/25/2020 00:14   DG Hand Complete Right  Result Date: 06/25/2020 CLINICAL DATA:  Fall tonight.  Right wrist deformity. EXAM: RIGHT HAND - COMPLETE 3+ VIEW COMPARISON:  None. FINDINGS: Distal radius fracture is better assessed on concurrent wrist exam. There is no additional fracture of the hand. There is ulnar subluxation of the fifth digit at the proximal interphalangeal joint that appears chronic. Multifocal osteoarthritis, primarily throughout the digits. Soft tissue prominence at the second through fourth proximal interphalangeal joints, likely Bouchard's nodes. IMPRESSION: 1. Distal radius fracture better assessed on concurrent wrist exam. No additional fracture of the hand. 2. Multifocal osteoarthritis, primarily throughout the digits. Electronically Signed   By: Narda RutherfordMelanie  Sanford M.D.   On: 06/25/2020 00:10   DG Hip Unilat W or Wo Pelvis 2-3 Views Right  Result Date: 06/25/2020 CLINICAL DATA:  Post fall with right hip deformity. EXAM: DG HIP (WITH OR WITHOUT PELVIS) 2-3V RIGHT  COMPARISON:  None. FINDINGS: There is a displaced right femoral neck fracture. Proximal migration of the femoral shaft. The femoral head remains seated. Pubic rami are intact. Bones are under mineralized. IMPRESSION: Displaced right femoral neck fracture. Electronically Signed   By: Narda RutherfordMelanie  Sanford M.D.   On: 06/25/2020 00:12     Assessment/Plan 87 year old female with history of atrial fibrillation on Eliquis, diastolic heart failure, dementia, HTN, PAD pacemaker placement, presenting following a witnessed accidental fall sustaining right hip fracture and right distal radius fracture.     Closed displaced fracture of right femoral neck (HCC)   Witnessed accidental fall in patient on chronic anticoagulation   Preoperative clearance -Accidental fall witnessed by daughter and caregiver -Patient low to moderate risk of perioperative cardiopulmonary events -Orthopedic repair in 2 to 3 days by Dr. Odis LusterBowers on secure chat -Hold Eliquis  -Pain control -Consult orthopedics for further orders    Distal radius fracture, right -Placed in sugar tong splint in ER -Further recommendations per Ortho    Atrial fibrillation and flutter (with RVR in ER, resolved prior to admission)   Chronic anticoagulation -Patient was found to be in rapid A. fib in the ER achieving rate control with single dose of IV diltiazem 10 mg -Continuous cardiac monitoring for now -Resume home metoprolol -Holding Eliquis in anticipation of surgery on 2/18    Alzheimer's dementia without behavioral disturbance (HCC) -Haldol as needed behavioral disturbance -Continue quetiapine.    Essential HTN -BP was elevated on arrival to 179/128, likely related to pain -Continue home antihypertensives, losartan and metoprolol    Status post placement of cardiac pacemaker -No acute disease suspected    DVT prophylaxis:SCD Code Status: full code.  Discussed with sister Family Communication: Sister at bedside Disposition Plan: Back  to previous home environment Consults called: Orthopedics Status:At the time of admission, it appears that the appropriate admission status for this patient is INPATIENT. This is judged to be reasonable and necessary in order to provide the required intensity of service to ensure the patient's safety given the presenting symptoms, physical exam findings, and initial radiographic and laboratory data in the context of their  Comorbid conditions.   Patient requires inpatient status due to high intensity of service, high risk for further deterioration and high frequency of surveillance required.   I certify that at the point of admission it is my clinical judgment that the patient  will require inpatient hospital care spanning beyond 2 midnights     Andris Baumann MD [facility]     06/25/2020, 2:46 AM

## 2020-06-25 NOTE — Progress Notes (Addendum)
Foley cath inserted, urinalysis collected and sent to lab per MD order  [phone]- Foley cath removed due to patient's agitation and attempt at pulling it out. Patient has removed right arm splint. Refuses to allow nurse to place back on. Patient is moving wrist around. Bruising and swelling noted to her wrist. She doesn't c/o pain to her wrist but says that its a little stiff.   Bed linen changed due to incontinence.

## 2020-06-25 NOTE — Progress Notes (Signed)
   06/25/20 0756  Assess: MEWS Score  Temp 97.9 F (36.6 C)  BP (!) 206/107  Pulse Rate 89  Resp 16  SpO2 95 %  O2 Device Room Air  Assess: MEWS Score  MEWS Temp 0  MEWS Systolic 2  MEWS Pulse 0  MEWS RR 0  MEWS LOC 0  MEWS Score 2  MEWS Score Color Yellow  Assess: if the MEWS score is Yellow or Red  Were vital signs taken at a resting state? Yes  Focused Assessment Change from prior assessment (see assessment flowsheet)  Early Detection of Sepsis Score *See Row Information* Low  MEWS guidelines implemented *See Row Information* Yes  Treat  MEWS Interventions Administered scheduled meds/treatments  Pain Scale Faces  Faces Pain Scale 4  Pain Type Acute pain  Pain Location Hip  Pain Orientation Right  Pain Descriptors / Indicators Grimacing  Take Vital Signs  Increase Vital Sign Frequency  Yellow: Q 2hr X 2 then Q 4hr X 2, if remains yellow, continue Q 4hrs  Escalate  MEWS: Escalate Yellow: discuss with charge nurse/RN and consider discussing with provider and RRT  Notify: Charge Nurse/RN  Name of Charge Nurse/RN Notified Charlann Boxer, RN  Date Charge Nurse/RN Notified 06/25/20  Time Charge Nurse/RN Notified 0809  Notify: Provider  Provider Name/Title Dr. Marylu Lund  Date Provider Notified 06/25/20  Time Provider Notified [phone]  Notification Type Page (secure chaty)  Notification Reason Change in status  Response See new orders  Date of Provider Response 06/25/20  Time of Provider Response [phone]

## 2020-06-25 NOTE — Progress Notes (Signed)
Initial Nutrition Assessment  DOCUMENTATION CODES:   Severe malnutrition in context of chronic illness,Underweight  INTERVENTION:  Recommend liberalizing diet to regular.  Provide Ensure Enlive po BID, each supplement provides 350 kcal and 20 grams of protein. Patient prefers vanilla or chocolate.  Provide Magic cup TID with meals, each supplement provides 290 kcal and 9 grams of protein.  Provide MVI po daily.  NUTRITION DIAGNOSIS:   Severe Malnutrition related to chronic illness (dementia, CHF) as evidenced by severe fat depletion,severe muscle depletion.  GOAL:   Patient will meet greater than or equal to 90% of their needs  MONITOR:   PO intake,Supplement acceptance,Labs,Weight trends,I & O's  REASON FOR ASSESSMENT:   Consult Assessment of nutrition requirement/status  ASSESSMENT:   87 year old female with PMHx of CHF, dementia, A-fib, HTN, pacemaker placement who is admitted after a fall with displaced right femoral neck fracture.   2/17 SLP evaluation recommended continuing regular texture diet and thin liquids  Met with patient and her daughter at bedside. Daughter provided most of the history. She reports patient lives alone but all 3 daughters are nearby and take turns staying with her. She typically eats 3 meals per day but she eats very small amounts at meals now. For breakfast today she had bites of eggs. At lunch she had bites of mashed potatoes. Discussed increased nutrient needs for post-operative healing. Patient would benefit from liberalized diet. Daughter reports patient would probably try Ensure Enlive and [employer].   Patient's UBW at most was only 120 lbs and that was when she was much younger. Patient currently 44.6 kg (98.33 lbs). There is a weight documented of 72.6 kg on 11/01/2019 but suspect this is not accurate.  Medications reviewed and include: Protonix.  Labs reviewed.  NUTRITION - FOCUSED PHYSICAL EXAM:  Flowsheet Row Most Recent Value   Orbital Region Severe depletion  Upper Arm Region Severe depletion  Thoracic and Lumbar Region Severe depletion  Buccal Region Severe depletion  Temple Region Severe depletion  Clavicle Bone Region Severe depletion  Clavicle and Acromion Bone Region Severe depletion  Scapular Bone Region Severe depletion  Dorsal Hand Severe depletion  Patellar Region Severe depletion  Anterior Thigh Region Severe depletion  Posterior Calf Region Severe depletion  Edema (RD Assessment) None  Hair Reviewed  Eyes Reviewed  Mouth Reviewed  Skin Reviewed  Nails Reviewed     Diet Order:   Diet Order            Diet Heart Room service appropriate? Yes; Fluid consistency: Thin  Diet effective now                EDUCATION NEEDS:   No education needs have been identified at this time  Skin:  Skin Assessment: Reviewed RN Assessment  Last BM:  Unknown  Height:   Ht Readings from Last 1 Encounters:  06/24/20 '5\' 6"'  (1.676 m)   Weight:   Wt Readings from Last 1 Encounters:  06/25/20 44.6 kg   BMI:  Body mass index is 15.87 kg/m.  Estimated Nutritional Needs:   Kcal:  1300-1500  Protein:  65-75 grams  Fluid:  1.2-1.5 L/day  Jacklynn Barnacle, MS, RD, LDN Pager number available on Amion

## 2020-06-25 NOTE — ED Notes (Signed)
Dr Para March now at bedside to eval patient - daughter at bedside

## 2020-06-25 NOTE — Progress Notes (Signed)
Patient ID: Kristen Osborn, female   DOB: 08-12-1926, 87 y.o.   MRN: [id number] No charge note as patient was admitted this AM.  Seen and examined H&P reviewed. Kristen Osborn is a 87 y.o. female with medical history significant for atrial fibrillation on Eliquis, diastolic heart failure, dementia, HTN, PAD pacemaker placement, was brought to the emergency room following an accidental fall.: X-ray right hip: Displaced right femoral neck fracture.  Patient is confused as she has dementia.  Laying in bed.  No complaints.  bp elevated,hr tachy cta no w/r/r Irreg, s1/s2 Soft benign +bs No edema  A/P Rt hip fx-  eliquis on hold Plan for surgery this week-Sunday  afib rvr- increase beta blk to 25mg  bid Holding a/c

## 2020-06-25 NOTE — Progress Notes (Signed)
Clinical/Bedside Swallow Evaluation Patient Details  Name: Kristen Osborn MRN: [id number] Date of Birth: 1926/08/05  Today's Date: 06/25/2020 Time: SLP Start Time (ACUTE ONLY): (P) 1315 SLP Stop Time (ACUTE ONLY): (P) 1340 SLP Time Calculation (min) (ACUTE ONLY): (P) 25 min  Past Medical History:  Past Medical History:  Diagnosis Date   Atrial fibrillation (HCC)    CHF (congestive heart failure) (HCC)    Dementia (HCC)    Past Surgical History:  Past Surgical History:  Procedure Laterality Date   ABDOMINAL HYSTERECTOMY     HPI:  Kristen Osborn is a 87 y.o. female with medical history significant for atrial fibrillation, diastolic heart failure, dementia, HTN, PAD pacemaker placement, was brought to the emergency room following an accidental fall, found to have right femoral neck fx.   Assessment / Plan / Recommendation Clinical Impression  Patient presents with overall mild aspiration risk due to cognitive deficits, dementia. Patient is alert and pleasantly confused. Oriented to self only. Daughter present at bedside and reports no history of swallowing difficulties. Patient able to follow simple commands, but has difficulty with following commands for more complex movements such as tongue lateralization, volitional swallow. She is easily distracted. Patient was able to self-feed sips of thin liquid via straw, puree by teaspoon, and graham cracker. Oral containment, manipulation, transfer, and timing of swallow initiation appear functional. There are no overt signs of aspiration. Patient required cues due to losing focus between bites/sips, however once taking boluses swallowing process remains automatic. I recommend she continue regular solids, thin liquids, give meds whole as tolerated (if pt has difficulty with liquid, attempt whole or crushed in puree). As mental status may fluctuate, advise supervision and assistance with meals if pt is not self-feeding. SLP to follow up briefly for  diet tolerance after surgery to ensure no functional decline in swallowing abilities should mental status change post-procedure.    SLP Visit Diagnosis: Dysphagia, oropharyngeal phase (R13.12)    Aspiration Risk  Mild aspiration risk    Diet Recommendation Thin liquid   Liquid Administration via: Cup;Straw Medication Administration: Whole meds with liquid (can give in puree if pt has difficulty) Supervision: Patient able to self feed;Comment (Assist as needed; may need assistance if mental status fluctuates) Compensations: Slow rate;Small sips/bites;Minimize environmental distractions Postural Changes: Seated upright at 90 degrees    Other  Recommendations Oral Care Recommendations: Oral care BID   Follow up Recommendations  (tbd)      Frequency and Duration min 1 x/week  1 week       Prognosis Prognosis for Safe Diet Advancement: Good Barriers to Reach Goals: Cognitive deficits      Swallow Study   General Date of Onset: 06/24/20 HPI: Kristen Osborn is a 87 y.o. female with medical history significant for atrial fibrillation, diastolic heart failure, dementia, HTN, PAD pacemaker placement, was brought to the emergency room following an accidental fall, found to have right femoral neck fx. Type of Study: Bedside Swallow Evaluation Previous Swallow Assessment: none on file Diet Prior to this Study: Regular;Thin liquids Temperature Spikes Noted: No Respiratory Status: Room air History of Recent Intubation: No Behavior/Cognition: Alert;Confused;Pleasant mood Oral Cavity Assessment: Within Functional Limits Oral Care Completed by SLP: No Oral Cavity - Dentition: Other (Comment) (partial) Vision: Functional for self-feeding Self-Feeding Abilities: Able to feed self;Other (Comment) (needs some cues due to distraction but is capable) Patient Positioning: Upright in bed Baseline Vocal Quality: Normal Volitional Cough: Cognitively unable to elicit Volitional Swallow: Unable  to elicit  Oral/Motor/Sensory Function Overall Oral Motor/Sensory Function: Within functional limits   Ice Chips Ice chips: Within functional limits Presentation: Spoon   Thin Liquid Thin Liquid: Within functional limits Presentation: Straw    Nectar Thick Nectar Thick Liquid: Not tested   Honey Thick Honey Thick Liquid: Not tested   Puree Puree: Within functional limits Presentation: Self Fed;Spoon Other Comments: spilled during retrival due to distraction   Solid    Rondel Baton, MS, CCC-SLP Speech-Language Pathologist  Solid: Within functional limits Presentation: Self Fed      Arlana Lindau 06/25/2020,2:55 PM

## 2020-06-26 DIAGNOSIS — E43 Unspecified severe protein-calorie malnutrition: Secondary | ICD-10-CM | POA: Insufficient documentation

## 2020-06-26 DIAGNOSIS — S72001A Fracture of unspecified part of neck of right femur, initial encounter for closed fracture: Secondary | ICD-10-CM | POA: Diagnosis not present

## 2020-06-26 DIAGNOSIS — R339 Retention of urine, unspecified: Secondary | ICD-10-CM

## 2020-06-26 LAB — GLUCOSE, CAPILLARY: Glucose-Capillary: 144 mg/dL — ABNORMAL HIGH (ref 70–99)

## 2020-06-26 MED ORDER — ENOXAPARIN SODIUM 40 MG/0.4ML ~~LOC~~ SOLN: 40.0000 mg | SUBCUTANEOUS | Status: DC

## 2020-06-26 MED ORDER — ENOXAPARIN SODIUM 30 MG/0.3ML ~~LOC~~ SOLN
30.0000 mg | SUBCUTANEOUS | Status: DC
  Administered 2020-06-26: 30 mg via SUBCUTANEOUS
  Filled 2020-06-26: qty 0.3

## 2020-06-26 MED ORDER — METOPROLOL TARTRATE 25 MG PO TABS
25.0000 mg | ORAL_TABLET | Freq: Four times a day (QID) | ORAL | Status: DC
  Administered 2020-06-26 – 2020-07-06 (×37): 25 mg via ORAL
  Filled 2020-06-26 (×39): qty 1

## 2020-06-26 MED ORDER — CHLORHEXIDINE GLUCONATE CLOTH 2 % EX PADS
6.0000 | MEDICATED_PAD | Freq: Every day | CUTANEOUS | Status: DC
  Administered 2020-06-26 – 2020-07-06 (×10): 6 via TOPICAL

## 2020-06-26 MED ORDER — LIDOCAINE 5 % EX PTCH
1.0000 | MEDICATED_PATCH | CUTANEOUS | Status: DC
  Administered 2020-06-26 – 2020-07-05 (×7): 1 via TRANSDERMAL
  Filled 2020-06-26 (×11): qty 1

## 2020-06-26 NOTE — Progress Notes (Signed)
Subjective:  Patient reports pain as mild.  Daughter at bediside.  Objective:   VITALS:   Vitals:   06/26/20 0511 06/26/20 0603 06/26/20 0816 06/26/20 1211  BP: (!) 189/95 (!) 156/68 (!) 169/86 136/71  Pulse: (!) 103 (!) 103 (!) 110 99  Resp: 18  17 16   Temp: 99.3 F (37.4 C)  (!) 97.5 F (36.4 C) 99.1 F (37.3 C)  TempSrc:   Oral Oral  SpO2: 94%  93% 94%  Weight:      Height:        PHYSICAL EXAM:  Neurologically intact ABD soft Neurovascular intact Sensation intact distally Intact pulses distally Dorsiflexion/Plantar flexion intact No cellulitis present Compartment soft  LABS  Results for orders placed or performed during the hospital encounter of 06/24/20 (from the past 24 hour(s))  Glucose, capillary     Status: Abnormal   Collection Time: 06/26/20  8:15 AM  Result Value Ref Range   Glucose-Capillary 144 (H) 70 - 99 mg/dL   Comment 1 Notify RN     DG Wrist Complete Right  Result Date: 06/25/2020 CLINICAL DATA:  Post fall with right wrist deformity. EXAM: RIGHT WRIST - COMPLETE 3+ VIEW COMPARISON:  None. FINDINGS: Impacted displaced distal radial metaphysis fracture. There is apex volar angulation. There is no definite radiocarpal joint involvement. There is no fracture involvement of the distal radioulnar joint, however the joint is offset. Degenerative cystic changes in the ulna styloid. No carpal bone fracture. IMPRESSION: Impacted and displaced distal radial metaphysis fracture with apex volar angulation. Disruption of the distal radioulnar joint. Electronically Signed   By: 06/27/2020 M.D.   On: 06/25/2020 00:11   CT Head Wo Contrast  Result Date: 06/25/2020 CLINICAL DATA:  87 year old post fall tonight. EXAM: CT HEAD WITHOUT CONTRAST TECHNIQUE: Contiguous axial images were obtained from the base of the skull through the vertex without intravenous contrast. COMPARISON:  Head CT 11/01/2019 FINDINGS: Brain: Despite repeat acquisition, there is motion  artifact limitations. Stable degree of generalized atrophy and chronic small vessel ischemia. No evidence of intracranial hemorrhage, mass effect, or midline shift. No hydrocephalus. The basilar cisterns are patent. No evidence of territorial infarct or acute ischemia. No extra-axial or intracranial fluid collection. Vascular: Atherosclerosis of skullbase vasculature without hyperdense vessel or abnormal calcification. Skull: No fracture or focal lesion. Sinuses/Orbits: No acute findings.  Bilateral cataract resection. Other: None. IMPRESSION: 1. No acute intracranial abnormality. No skull fracture. 2. Stable atrophy and chronic small vessel ischemia. Electronically Signed   By: 11/03/2019 M.D.   On: 06/25/2020 01:10   CT Cervical Spine Wo Contrast  Result Date: 06/25/2020 CLINICAL DATA:  Neck trauma (Age >= 38y) 87 year old post fall. EXAM: CT CERVICAL SPINE WITHOUT CONTRAST TECHNIQUE: Multidetector CT imaging of the cervical spine was performed without intravenous contrast. Multiplanar CT image reconstructions were also generated. COMPARISON:  None. FINDINGS: Alignment: 4 mm anterolisthesis of C2 on C3, 3 mm anterolisthesis C3 on C4, 4 mm anterolisthesis C4 on C5, and 3 mm anterolisthesis C7 on T1. No traumatic subluxation, no jumped or perched facets. Skull base and vertebrae: No acute fracture. The dens and skull base are intact. Soft tissues and spinal canal: No prevertebral fluid or swelling. No visible canal hematoma. Disc levels: Diffuse degenerative disc disease, most prominent at C5-C6 and C4-C5. There is multilevel facet hypertrophy. Upper chest: Apical septal thickening consistent with pulmonary edema. Other: Carotid calcifications. IMPRESSION: 1. Multilevel degenerative disc disease and facet hypertrophy throughout the cervical spine without acute fracture or  subluxation. 2. Multilevel anterolisthesis, typically degenerative. 3. Pulmonary edema at the lung apices. Electronically Signed   By:  Narda Rutherford M.D.   On: 06/25/2020 01:14   DG Chest Portable 1 View  Result Date: 06/25/2020 CLINICAL DATA:  Fall tonight with right wrist and right hip deformity. EXAM: PORTABLE CHEST 1 VIEW COMPARISON:  Radiograph 10/05/2011 FINDINGS: Single lead left-sided pacemaker in place. Cardiomegaly is grossly stable. Unchanged mediastinal contours. Dense aortic atherosclerosis. No focal airspace disease, pneumothorax or large pleural effusion. Soft tissue density projecting over the left lung base is unchanged from prior and likely soft tissue attenuation. Mild vascular congestion. No acute osseous abnormalities are seen. IMPRESSION: 1. No acute traumatic process. 2. Mild vascular congestion. Stable cardiomegaly. Aortic Atherosclerosis (ICD10-I70.0). Electronically Signed   By: Narda Rutherford M.D.   On: 06/25/2020 00:14   DG Hand Complete Right  Result Date: 06/25/2020 CLINICAL DATA:  Fall tonight.  Right wrist deformity. EXAM: RIGHT HAND - COMPLETE 3+ VIEW COMPARISON:  None. FINDINGS: Distal radius fracture is better assessed on concurrent wrist exam. There is no additional fracture of the hand. There is ulnar subluxation of the fifth digit at the proximal interphalangeal joint that appears chronic. Multifocal osteoarthritis, primarily throughout the digits. Soft tissue prominence at the second through fourth proximal interphalangeal joints, likely Bouchard's nodes. IMPRESSION: 1. Distal radius fracture better assessed on concurrent wrist exam. No additional fracture of the hand. 2. Multifocal osteoarthritis, primarily throughout the digits. Electronically Signed   By: Narda Rutherford M.D.   On: 06/25/2020 00:10   DG Hip Unilat W or Wo Pelvis 2-3 Views Right  Result Date: 06/25/2020 CLINICAL DATA:  Post fall with right hip deformity. EXAM: DG HIP (WITH OR WITHOUT PELVIS) 2-3V RIGHT COMPARISON:  None. FINDINGS: There is a displaced right femoral neck fracture. Proximal migration of the femoral shaft. The  femoral head remains seated. Pubic rami are intact. Bones are under mineralized. IMPRESSION: Displaced right femoral neck fracture. Electronically Signed   By: Narda Rutherford M.D.   On: 06/25/2020 00:12    Assessment/Plan:     Principal Problem:   Closed displaced fracture of right femoral neck (HCC) Active Problems:   Alzheimer's dementia without behavioral disturbance (HCC)   Atrial fibrillation and flutter (HCC)   Benign essential HTN   PAD (peripheral artery disease) (HCC)   Status post placement of cardiac pacemaker   Chronic anticoagulation   Preoperative clearance   Distal radius fracture, right   Protein-calorie malnutrition, severe   Plan a right hip hemiarthroplasty when cleared for spinal anesthesia, likely on Sunday. Will also perform closed reduction of her right wrist. Apply ice and elevate the hand. Plan of care is discussed with her daughter.  The diagnosis, risks, benefits and alternatives to treatment are all discussed in detail with the patient and family. Risks include but are not limited to bleeding, infection, deep vein thrombosis, pulmonary embolism, nerve or vascular injury, non-union, repeat operation, persistent pain, weakness, stiffness and death. Her daughter understands and is eager to proceed.   Altamese Cabal , PA-C 06/26/2020, 2:23 PM

## 2020-06-26 NOTE — Progress Notes (Signed)
Attempted to place indwelling foley 3x with charge nurse. Catheter would go into urethra give a small amount of urine return, and with advancement would emerge out of the vagina appearing to be a possible fistula. After several attempts with foley catheter kits we tried an in and out catheter (more rigid) and after some manipulation it was successful and 1100cc of urine was drained. House supervisor called and got [place] a coude and coude nurse from [facility] was called to assist with placement. It appears to be in the bladder and a small amount of urine is draining into the tubing. Will continue monitoring for drainage and bladder scanning for any signs of retention.

## 2020-06-26 NOTE — Consult Note (Signed)
Urology Consult  I have been asked to see the patient by Dr. Marylu Lund, for evaluation and management of urinary retention/difficult Foley catheter.  Chief Complaint: retention  History of Present Illness: Kristen Osborn is a 87 y.o. year old with advanced dementia sustained a fall resulting in a right distal radial fracture along with right femoral neck fracture awaiting surgical intervention.  During his hospital admission, she developed urinary retention and inability to void.  Overnight, there was significant difficulty as outlined by nursing notes placing a Foley catheter.  There were multiple attempts made unsuccessfully.  On one occasion, is felt that possibly the catheter was coming back out of the vagina and possible fistulous tract was brought into question.  Ultimately, they were able to place the catheter with a coud and has been draining since.  Urine output was 1.1 L at the time of placement.  Patient is nonverbal today and I was unable to glean any history from her.  I did speak all 3 of her daughters today including Kristen Osborn and Kristen Osborn by telephone and the third sister who is at her bedside.  Each of them recall that in the very remote past she was admitted to [facility] here in [place] when they had a difficult time placing a catheter.  She had to have her urethra stretched to the best recollection of one of the sisters.  She cannot remember the details of this but does not think that it was any sort of major surgery.  Another sister also recalls something about her urethral opening being located more inside of her vagina than on the outside.  Per Kristen Osborn's report, her mother is mostly continent.  She had occasional what sounds like urge incontinence with large volume incontinence but had no history of constant dribbling.  She had no issues with recurrent infections.  She did get up multiple times in the night and void often.   Past Medical History:  Diagnosis Date  . Atrial  fibrillation (HCC)   . CHF (congestive heart failure) (HCC)   . Dementia [facility])     Past Surgical History:  Procedure Laterality Date  . ABDOMINAL HYSTERECTOMY      Home Medications:  Current Meds  Medication Sig  . Cyanocobalamin 1000 MCG LOZG Place 1 lozenge under the tongue daily.  Marland Kitchen ELIQUIS 5 MG TABS tablet Take 5 mg by mouth 2 (two) times daily.  Marland Kitchen losartan (COZAAR) 25 MG tablet Take 25 mg by mouth daily.  . metoprolol tartrate (LOPRESSOR) 25 MG tablet Take 12.5 mg by mouth 2 (two) times daily.  Marland Kitchen omeprazole (PRILOSEC) 20 MG capsule Take 20 mg by mouth daily.  . potassium chloride (KLOR-CON) 8 MEQ tablet Take 8 mEq by mouth daily.  . QUEtiapine (SEROQUEL) 25 MG tablet Take 25 mg by mouth daily. Takes at 5 pm    Allergies:  Allergies  Allergen Reactions  . Lisinopril Other (See Comments)    History reviewed. No pertinent family history.  Social History:  has no history on file for tobacco use, alcohol use, and drug use.  ROS: Unable to obtain due to mental status  Physical Exam:  Vital signs in last 24 hours: Temp:  [97.5 F (36.4 C)-99.3 F (37.4 C)] 99.1 F (37.3 C) (02/18 1211) Pulse Rate:  [67-110] 99 (02/18 1211) Resp:  [14-18] 16 (02/18 1211) BP: (136-189)/(68-112) 136/71 (02/18 1211) SpO2:  [87 %-94 %] 94 % (02/18 1211) Constitutional:  Alert , nonverbal HEENT: [place] AT, moist  mucus membranes.  Trachea midline, no masses Cardiovascular: Regular rate and rhythm, no clubbing, cyanosis, or edema. Respiratory: Normal respiratory effort, lungs clear bilaterally GI: Abdomen is soft, nontender, nondistended, no abdominal masses GU: Draining clear yellow urine   Laboratory Data:  Recent Labs    06/25/20 0022  WBC 5.1  HGB 12.3  HCT 37.1   Recent Labs    06/25/20 0022  NA 135  K 3.7  CL 100  CO2 25  GLUCOSE 129*  BUN 21  CREATININE 0.91  CALCIUM 9.1   Recent Labs    06/25/20 0022  INR 1.2    Impression/plan:  1.  Difficult Foley/urinary  retention-given the difficulty with catheter insertion requiring multiple attempts and what sounds like difficult urethral anatomy, I would hesitate to remove this catheter until the patient has recovered from surgery and is more ambulatory as the probability of her voiding spontaneously is fairly low at this current time in the setting of medical illness, immobility and pharmacotherapy.  I recommended leaving the Foley catheter for at least a week with consideration of voiding trial at that point in time.  If the catheter needs to be replaced, please use a coud catheter again.  We also discussed the risk and benefits of maintaining a Foley catheter for an entire week.  We discussed the risk of her going back into retention and needing multiple attempts at replacement and the risk of infection inherent to that as well as risk of maintaining a Foley catheter for more prolonged period of time.  Kristen Osborn and her daughters agree that keeping the catheter in place makes the most sense in terms of patient comfort as well as risk of infection.  2.  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  I elected not to do an exam today which all the daughters agreed with as understanding her anatomy at this point is unlikely to change any sort of management.  She is not a surgical candidate for fistula repair and she currently has a Foley catheter in place which is draining well and functioning.  Her daughters all agree that putting her through a vaginal exam would be of no benefit at this time.  In addition to the above, it sounds based on her history, that she may have some urethral atrophy versus kinking versus stenosis or perhaps that urethra is somewhat ectopic as a result in that the catheter may have just simply coiled in the vagina rather than the possibility of a  vesicovaginal fistula.  Either way, we will not plan for any further work-up of this at this time.  No need for further imaging.  06/26/2020, 1:07 PM  Vanna Scotland,  MD    I spent 80 total minutes on the day of the encounter including pre-visit review of the medical record, face-to-face time with the patient, and post visit ordering of labs/imaging/tests.  Much of this time was spent on the phone discussing her GU history with her sisters as well as goals of care and various treatment options with her healthcare proxy Kristen Osborn.  Please call urology if she fails a voiding trial and there is difficulty replacing her coud catheter.

## 2020-06-26 NOTE — Progress Notes (Signed)
PHARMACIST - PHYSICIAN COMMUNICATION  CONCERNING:  Enoxaparin (Lovenox) for DVT Prophylaxis   RECOMMENDATION: Patient was prescribed enoxaprin 40mg  q24 hours for VTE prophylaxis.   Filed Weights   06/24/20 2319 06/25/20 0411  Weight: 45.4 kg (100 lb) 44.6 kg (98 lb 5.2 oz)    Body mass index is 15.87 kg/m.  Estimated Creatinine Clearance: 28.4 mL/min (by C-G formula based on SCr of 0.91 mg/dL).   Patient is candidate for enoxaparin 30mg  every 24 hours based on CrCl <95ml/min or Weight <45kg  DESCRIPTION: Pharmacy has adjusted enoxaparin dose per [facility] policy.  Patient is now receiving enoxaparin 30 mg every 24 hours   31m, PharmD, BCPS Clinical Pharmacist 06/26/2020 12:38 PM

## 2020-06-26 NOTE — Progress Notes (Addendum)
PROGRESS NOTE    Kristen Osborn  ZOX:[id number] DOB: 1927-04-20 DOA: 06/24/2020 PCP: Kristen Osborn, Kristen F, MD    Brief Narrative:  Kristen HaverVirginia W Cookis a 87 y.o.femalewith medical history significant foratrial fibrillation on Eliquis, diastolic heart failure, dementia, HTN, PAD pacemaker placement, was brought to the emergency room following an accidental fall.:X-ray right hip: Displaced right femoral neck fracture.  Patient is confused as she has dementia.  Laying in bed.  No complaints  2/18-indwelling Foley was attempted earlier this morning and catheter went into urethra and then during advancement it emerged out of vagina and there was concern for fistula.  Foley in place now.  After manipulation 1100 cc was drained.  Per daughter at bedside patient had issues with Foley placement in the past.  Consultants:   Orthopedics  Procedures:   Antimicrobials:       Subjective: Patient is calm confused at baseline sitting cath daughter at bedside  Objective: Vitals:   06/26/20 0001 06/26/20 0511 06/26/20 0603 06/26/20 0816  BP: (!) 177/76 (!) 189/95 (!) 156/68 (!) 169/86  Pulse: 67 (!) 103 (!) 103 (!) 110  Resp: 18 18  17   Temp: 98.4 F (36.9 C) 99.3 F (37.4 C)  (!) 97.5 F (36.4 C)  TempSrc: Oral   Oral  SpO2: 93% 94%  93%  Weight:      Height:        Intake/Output Summary (Last 24 hours) at 06/26/2020 0825 Last data filed at 06/26/2020 0300 Gross per 24 hour  Intake 480 ml  Output 350 ml  Net 130 ml   Filed Weights   06/24/20 2319 06/25/20 0411  Weight: 45.4 kg 44.6 kg    Examination:  Calm, comfortable CTA, no wheeze rales rhonchi's Irregular S1-S2 Soft benign positive bowel sounds No edema Awake confused at baseline Mood and affect appropriate in current setting    Data Reviewed: I have personally reviewed following labs and imaging studies  CBC: Recent Labs  Lab 06/25/20 0022  WBC 5.1  NEUTROABS 3.0  HGB 12.3  HCT 37.1  MCV 88.5  PLT 140*    Basic Metabolic Panel: Recent Labs  Lab 06/25/20 0022  NA 135  K 3.7  CL 100  CO2 25  GLUCOSE 129*  BUN 21  CREATININE 0.91  CALCIUM 9.1   GFR: Estimated Creatinine Clearance: 28.4 mL/min (by C-G formula based on SCr of 0.91 mg/dL). Liver Function Tests: No results for input(s): AST, ALT, ALKPHOS, BILITOT, PROT, ALBUMIN in the last 168 hours. No results for input(s): LIPASE, AMYLASE in the last 168 hours. No results for input(s): AMMONIA in the last 168 hours. Coagulation Profile: Recent Labs  Lab 06/25/20 0022  INR 1.2   Cardiac Enzymes: No results for input(s): CKTOTAL, CKMB, CKMBINDEX, TROPONINI in the last 168 hours. BNP (last 3 results) No results for input(s): PROBNP in the last 8760 hours. HbA1C: No results for input(s): HGBA1C in the last 72 hours. CBG: Recent Labs  Lab 06/26/20 0815  GLUCAP 144*   Lipid Profile: No results for input(s): CHOL, HDL, LDLCALC, TRIG, CHOLHDL, LDLDIRECT in the last 72 hours. Thyroid Function Tests: No results for input(s): TSH, T4TOTAL, FREET4, T3FREE, THYROIDAB in the last 72 hours. Anemia Panel: No results for input(s): VITAMINB12, FOLATE, FERRITIN, TIBC, IRON, RETICCTPCT in the last 72 hours. Sepsis Labs: No results for input(s): PROCALCITON, LATICACIDVEN in the last 168 hours.  Recent Results (from the past 240 hour(s))  Resp Panel by RT-PCR (Flu A&B, Covid)  Status: None   Collection Time: 06/25/20  2:28 AM  Result Value Ref Range Status   SARS Coronavirus 2 by RT PCR NEGATIVE NEGATIVE Final    Comment: (NOTE) SARS-CoV-2 target nucleic acids are NOT DETECTED.  The SARS-CoV-2 RNA is generally detectable in upper respiratory specimens during the acute phase of infection. The lowest concentration of SARS-CoV-2 viral copies this assay can detect is 138 copies/mL. A negative result does not preclude SARS-Cov-2 infection and should not be used as the sole basis for treatment or other patient management decisions. A  negative result may occur with  improper specimen collection/handling, submission of specimen other than nasopharyngeal swab, presence of viral mutation(s) within the areas targeted by this assay, and inadequate number of viral copies(<138 copies/mL). A negative result must be combined with clinical observations, patient history, and epidemiological information. The expected result is Negative.  Fact Sheet for Patients:  BloggerCourse.com  Fact Sheet for Healthcare Providers:  SeriousBroker.it  This test is no t yet approved or cleared by the [place] FDA and  has been authorized for detection and/or diagnosis of SARS-CoV-2 by FDA under an Emergency Use Authorization (EUA). This EUA will remain  in effect (meaning this test can be used) for the duration of the COVID-19 declaration under Section 564(b)(1) of the Act, 21 U.S.C.section 360bbb-3(b)(1), unless the authorization is terminated  or revoked sooner.       Influenza A by PCR NEGATIVE NEGATIVE Final   Influenza B by PCR NEGATIVE NEGATIVE Final    Comment: (NOTE) The Xpert Xpress SARS-CoV-2/FLU/RSV plus assay is intended as an aid in the diagnosis of influenza from Nasopharyngeal swab specimens and should not be used as a sole basis for treatment. Nasal washings and aspirates are unacceptable for Xpert Xpress SARS-CoV-2/FLU/RSV testing.  Fact Sheet for Patients: BloggerCourse.com  Fact Sheet for Healthcare Providers: SeriousBroker.it  This test is not yet approved or cleared by the [place] FDA and has been authorized for detection and/or diagnosis of SARS-CoV-2 by FDA under an Emergency Use Authorization (EUA). This EUA will remain in effect (meaning this test can be used) for the duration of the COVID-19 declaration under Section 564(b)(1) of the Act, 21 U.S.C. section 360bbb-3(b)(1), unless the authorization  is terminated or revoked.  Performed at [facility], [address], [place], [place] [address]          Radiology Studies: DG Wrist Complete Right  Result Date: 06/25/2020 CLINICAL DATA:  Post fall with right wrist deformity. EXAM: RIGHT WRIST - COMPLETE 3+ VIEW COMPARISON:  None. FINDINGS: Impacted displaced distal radial metaphysis fracture. There is apex volar angulation. There is no definite radiocarpal joint involvement. There is no fracture involvement of the distal radioulnar joint, however the joint is offset. Degenerative cystic changes in the ulna styloid. No carpal bone fracture. IMPRESSION: Impacted and displaced distal radial metaphysis fracture with apex volar angulation. Disruption of the distal radioulnar joint. Electronically Signed   By: Narda Rutherford M.D.   On: 06/25/2020 00:11   CT Head Wo Contrast  Result Date: 06/25/2020 CLINICAL DATA:  87 year old post fall tonight. EXAM: CT HEAD WITHOUT CONTRAST TECHNIQUE: Contiguous axial images were obtained from the base of the skull through the vertex without intravenous contrast. COMPARISON:  Head CT 11/01/2019 FINDINGS: Brain: Despite repeat acquisition, there is motion artifact limitations. Stable degree of generalized atrophy and chronic small vessel ischemia. No evidence of intracranial hemorrhage, mass effect, or midline shift. No hydrocephalus. The basilar cisterns are patent. No evidence of  territorial infarct or acute ischemia. No extra-axial or intracranial fluid collection. Vascular: Atherosclerosis of skullbase vasculature without hyperdense vessel or abnormal calcification. Skull: No fracture or focal lesion. Sinuses/Orbits: No acute findings.  Bilateral cataract resection. Other: None. IMPRESSION: 1. No acute intracranial abnormality. No skull fracture. 2. Stable atrophy and chronic small vessel ischemia. Electronically Signed   By: Narda Rutherford M.D.   On: 06/25/2020 01:10   CT Cervical Spine Wo  Contrast  Result Date: 06/25/2020 CLINICAL DATA:  Neck trauma (Age >= 40y) 87 year old post fall. EXAM: CT CERVICAL SPINE WITHOUT CONTRAST TECHNIQUE: Multidetector CT imaging of the cervical spine was performed without intravenous contrast. Multiplanar CT image reconstructions were also generated. COMPARISON:  None. FINDINGS: Alignment: 4 mm anterolisthesis of C2 on C3, 3 mm anterolisthesis C3 on C4, 4 mm anterolisthesis C4 on C5, and 3 mm anterolisthesis C7 on T1. No traumatic subluxation, no jumped or perched facets. Skull base and vertebrae: No acute fracture. The dens and skull base are intact. Soft tissues and spinal canal: No prevertebral fluid or swelling. No visible canal hematoma. Disc levels: Diffuse degenerative disc disease, most prominent at C5-C6 and C4-C5. There is multilevel facet hypertrophy. Upper chest: Apical septal thickening consistent with pulmonary edema. Other: Carotid calcifications. IMPRESSION: 1. Multilevel degenerative disc disease and facet hypertrophy throughout the cervical spine without acute fracture or subluxation. 2. Multilevel anterolisthesis, typically degenerative. 3. Pulmonary edema at the lung apices. Electronically Signed   By: Narda Rutherford M.D.   On: 06/25/2020 01:14   DG Chest Portable 1 View  Result Date: 06/25/2020 CLINICAL DATA:  Fall tonight with right wrist and right hip deformity. EXAM: PORTABLE CHEST 1 VIEW COMPARISON:  Radiograph 10/05/2011 FINDINGS: Single lead left-sided pacemaker in place. Cardiomegaly is grossly stable. Unchanged mediastinal contours. Dense aortic atherosclerosis. No focal airspace disease, pneumothorax or large pleural effusion. Soft tissue density projecting over the left lung base is unchanged from prior and likely soft tissue attenuation. Mild vascular congestion. No acute osseous abnormalities are seen. IMPRESSION: 1. No acute traumatic process. 2. Mild vascular congestion. Stable cardiomegaly. Aortic Atherosclerosis  (ICD10-I70.0). Electronically Signed   By: Narda Rutherford M.D.   On: 06/25/2020 00:14   DG Hand Complete Right  Result Date: 06/25/2020 CLINICAL DATA:  Fall tonight.  Right wrist deformity. EXAM: RIGHT HAND - COMPLETE 3+ VIEW COMPARISON:  None. FINDINGS: Distal radius fracture is better assessed on concurrent wrist exam. There is no additional fracture of the hand. There is ulnar subluxation of the fifth digit at the proximal interphalangeal joint that appears chronic. Multifocal osteoarthritis, primarily throughout the digits. Soft tissue prominence at the second through fourth proximal interphalangeal joints, likely Bouchard's nodes. IMPRESSION: 1. Distal radius fracture better assessed on concurrent wrist exam. No additional fracture of the hand. 2. Multifocal osteoarthritis, primarily throughout the digits. Electronically Signed   By: Narda Rutherford M.D.   On: 06/25/2020 00:10   DG Hip Unilat W or Wo Pelvis 2-3 Views Right  Result Date: 06/25/2020 CLINICAL DATA:  Post fall with right hip deformity. EXAM: DG HIP (WITH OR WITHOUT PELVIS) 2-3V RIGHT COMPARISON:  None. FINDINGS: There is a displaced right femoral neck fracture. Proximal migration of the femoral shaft. The femoral head remains seated. Pubic rami are intact. Bones are under mineralized. IMPRESSION: Displaced right femoral neck fracture. Electronically Signed   By: Narda Rutherford M.D.   On: 06/25/2020 00:12        Scheduled Meds: . Chlorhexidine Gluconate Cloth  6 each Topical Daily  .  feeding supplement  237 mL Oral BID BM  . losartan  25 mg Oral Daily  . metoprolol tartrate  25 mg Oral BID  . multivitamin with minerals  1 tablet Oral Daily  . pantoprazole  40 mg Oral Daily  . QUEtiapine  25 mg Oral Daily   Continuous Infusions:  Assessment & Plan:   Principal Problem:   Closed displaced fracture of right femoral neck (HCC) Active Problems:   Alzheimer's dementia without behavioral disturbance (HCC)   Atrial  fibrillation and flutter (HCC)   Benign essential HTN   PAD (peripheral artery disease) (HCC)   Status post placement of cardiac pacemaker   Chronic anticoagulation   Preoperative clearance   Distal radius fracture, right   87 year old female with history of atrial fibrillation on Eliquis, diastolic heart failure, dementia, HTN, PAD pacemaker placement, presenting following a witnessed accidental fall sustaining right hip fracture and right distal radius fracture.     Closed displaced fracture of right femoral neck (HCC) Rt distal radius fx   Witnessed accidental fall in patient on chronic anticoagulation   Preoperative clearance -Accidental fall witnessed by daughter and caregiver -Patient low to moderate risk of perioperative cardiopulmonary events -Orthopedics input was appreciated.  Plan for right hip hemiarthroplasty when cleared for spinal anesthesia likely on Sunday.  We will also perform closed reduction of her right wrist.   Apply ice and elevate the hand  Pain control  Add lidocaine patch Continue to hold Eliquis  Was placed in sugar tong splint in ER      Atrial fibrillation and flutter (with RVR in ER, resolved prior to admission)   Chronic anticoagulation -Patient was found to be in rapid A. fib in the ER achieving rate control with single dose of IV diltiazem 10 mg Heart rate more controlled Continue metoprolol, will increase for better bp controll.  Hold Eliquis anticipation of surgery over the weekend   Urinary Retention: Concern for fisutula pre nsg as they advanced foley cath in , emerged out of the vagina. Urology Dr. Apolinar Junes consulted and will see    Alzheimer's dementia without behavioral disturbance (HCC) -Haldol as needed behavioral disturbance Continue quetiapine    Essential HTN Elevated at times Pain control Increase beta blk -Continue home antihypertensives, losartan and metoprolol    Status post placement of cardiac pacemaker -No acute  disease suspected      DVT prophylaxis: Lovenox Code Status: Full Family Communication: Daughter at bedside  Status is: Inpatient  Remains inpatient appropriate because:Inpatient level of care appropriate due to severity of illness   Dispo: The patient is from: Home              Anticipated d/c is to: TBD              Anticipated d/c date is: 3 days              Patient currently is not medically stable to d/c.  Awaiting surgery on Sunday   Difficult to place patient No            LOS: 1 day   Time spent: 35 minutes with more than 50% on COC    Lynn Ito, MD [facility] Pager [phone]  If 7PM-7AM, please contact night-coverage 06/26/2020, 8:25 AM

## 2020-06-26 NOTE — Progress Notes (Signed)
Night coverage  Bladder scan with over . Foley ordered in view of hip fracture. Consider urology input in the am.

## 2020-06-26 NOTE — Progress Notes (Signed)
Called to assist with foley catheter placement, after several failed attempts; Coude catheter used with sterile technique, assisted by P.Arrington, RN; Coude successfully inserted with small amount of urine in tubing; secured to LLE, below bladder level; Patient tolerated well. Lariah Fleer K, RN;06/26/2020

## 2020-06-27 ENCOUNTER — Encounter: Payer: Self-pay | Admitting: Internal Medicine

## 2020-06-27 DIAGNOSIS — S72001A Fracture of unspecified part of neck of right femur, initial encounter for closed fracture: Secondary | ICD-10-CM | POA: Diagnosis not present

## 2020-06-27 MED ORDER — CEFAZOLIN SODIUM-DEXTROSE 2-4 GM/100ML-% IV SOLN
2.0000 g | INTRAVENOUS | Status: DC
  Filled 2020-06-27: qty 100

## 2020-06-27 NOTE — Progress Notes (Signed)
  Subjective:  Patient awaiting right hip hemiarthroplasty closed reduction of the right wrist.  Patient arrived to the ED on Eliquis for atrial fibrillation.  Patient's delayed for 72 hours so she could receive a spinal.  Patient awake but confused.  An adult female is at the bedside.  Objective:   VITALS:   Vitals:   06/27/20 0455 06/27/20 0604 06/27/20 0807 06/27/20 1142  BP: (!) 172/106 (!) 181/103 (!) 156/87 (!) 149/88  Pulse: 80 97 98 96  Resp: 18 19 17 17   Temp: 98 F (36.7 C) 98.1 F (36.7 C) 98.4 F (36.9 C) 98 F (36.7 C)  TempSrc:   Oral Oral  SpO2: 95% 96% 93% 95%  Weight:      Height:        PHYSICAL EXAM: Right lower extremity: Patient demonstrates difficulty following commands today. Neurovascular intact Intact pulses distally No cellulitis present Compartment soft  LABS  No results found for this or any previous visit (from the past 24 hour(s)).  No results found.  Assessment/Plan:     Principal Problem:   Closed displaced fracture of right femoral neck (HCC) Active Problems:   Alzheimer's dementia without behavioral disturbance (HCC)   Atrial fibrillation and flutter (HCC)   Benign essential HTN   PAD (peripheral artery disease) (HCC)   Status post placement of cardiac pacemaker   Chronic anticoagulation   Preoperative clearance   Distal radius fracture, right   Protein-calorie malnutrition, severe  Patient is due to undergo a right hip hemiarthroplasty and reduction of her right wrist tomorrow in the OR by Dr. .  Patient is n.p.o. after midnight.  I have discontinued Lovenox in preparation for surgery tomorrow.  Continue current pain management.    Cassell Smiles , MD 06/27/2020, 12:21 PM

## 2020-06-27 NOTE — Progress Notes (Signed)
PROGRESS NOTE    Kristen Osborn  GEX:[id number] DOB: 1926/12/14 DOA: 06/24/2020 PCP: Danella Penton, MD    Brief Narrative:  Kristen Osborn a 87 y.o.femalewith medical history significant foratrial fibrillation on Eliquis, diastolic heart failure, dementia, HTN, PAD pacemaker placement, was brought to the emergency room following an accidental fall.:X-ray right hip: Displaced right femoral neck fracture.  Patient is confused as she has dementia.  Laying in bed.  No complaints  2/18-indwelling Foley was attempted earlier this morning and catheter went into urethra and then during advancement it emerged out of vagina and there was concern for fistula.  Foley in place now.  After manipulation 1100 cc was drained.  Per daughter at bedside patient had issues with Foley placement in the past.  2/19-daughter at bedside. No overnight issues. Plan for surgery tomorrow.  Consultants:   Orthopedics  Procedures:   Antimicrobials:       Subjective: Calm, in bed, no complaints. sleepy  Objective: Vitals:   06/27/20 0302 06/27/20 0455 06/27/20 0604 06/27/20 0807  BP:  (!) 172/106 (!) 181/103 (!) 156/87  Pulse: 60 80 97 98  Resp:  18 19 17   Temp:  98 F (36.7 C) 98.1 F (36.7 C) 98.4 F (36.9 C)  TempSrc:    Oral  SpO2:  95% 96% 93%  Weight:      Height:        Intake/Output Summary (Last 24 hours) at 06/27/2020 0840 Last data filed at 06/27/2020 0630 Gross per 24 hour  Intake 480 ml  Output 500 ml  Net -20 ml   Filed Weights   06/24/20 2319 06/25/20 0411  Weight: 45.4 kg 44.6 kg    Examination: Calm, NAD CTA: No wheezing rales Irregular, S1-S2 Soft benign positive bowel sounds No edema At baseline confusion Mood and affect appropriate in current setting    Data Reviewed: I have personally reviewed following labs and imaging studies  CBC: Recent Labs  Lab 06/25/20 0022  WBC 5.1  NEUTROABS 3.0  HGB 12.3  HCT 37.1  MCV 88.5  PLT 140*   Basic  Metabolic Panel: Recent Labs  Lab 06/25/20 0022  NA 135  K 3.7  CL 100  CO2 25  GLUCOSE 129*  BUN 21  CREATININE 0.91  CALCIUM 9.1   GFR: Estimated Creatinine Clearance: 28.4 mL/min (by C-G formula based on SCr of 0.91 mg/dL). Liver Function Tests: No results for input(s): AST, ALT, ALKPHOS, BILITOT, PROT, ALBUMIN in the last 168 hours. No results for input(s): LIPASE, AMYLASE in the last 168 hours. No results for input(s): AMMONIA in the last 168 hours. Coagulation Profile: Recent Labs  Lab 06/25/20 0022  INR 1.2   Cardiac Enzymes: No results for input(s): CKTOTAL, CKMB, CKMBINDEX, TROPONINI in the last 168 hours. BNP (last 3 results) No results for input(s): PROBNP in the last 8760 hours. HbA1C: No results for input(s): HGBA1C in the last 72 hours. CBG: Recent Labs  Lab 06/26/20 0815  GLUCAP 144*   Lipid Profile: No results for input(s): CHOL, HDL, LDLCALC, TRIG, CHOLHDL, LDLDIRECT in the last 72 hours. Thyroid Function Tests: No results for input(s): TSH, T4TOTAL, FREET4, T3FREE, THYROIDAB in the last 72 hours. Anemia Panel: No results for input(s): VITAMINB12, FOLATE, FERRITIN, TIBC, IRON, RETICCTPCT in the last 72 hours. Sepsis Labs: No results for input(s): PROCALCITON, LATICACIDVEN in the last 168 hours.  Recent Results (from the past 240 hour(s))  Resp Panel by RT-PCR (Flu A&B, Covid)     Status: None  Collection Time: 06/25/20  2:28 AM  Result Value Ref Range Status   SARS Coronavirus 2 by RT PCR NEGATIVE NEGATIVE Final    Comment: (NOTE) SARS-CoV-2 target nucleic acids are NOT DETECTED.  The SARS-CoV-2 RNA is generally detectable in upper respiratory specimens during the acute phase of infection. The lowest concentration of SARS-CoV-2 viral copies this assay can detect is 138 copies/mL. A negative result does not preclude SARS-Cov-2 infection and should not be used as the sole basis for treatment or other patient management decisions. A negative  result may occur with  improper specimen collection/handling, submission of specimen other than nasopharyngeal swab, presence of viral mutation(s) within the areas targeted by this assay, and inadequate number of viral copies(<138 copies/mL). A negative result must be combined with clinical observations, patient history, and epidemiological information. The expected result is Negative.  Fact Sheet for Patients:  BloggerCourse.com  Fact Sheet for Healthcare Providers:  SeriousBroker.it  This test is no t yet approved or cleared by the [place] FDA and  has been authorized for detection and/or diagnosis of SARS-CoV-2 by FDA under an Emergency Use Authorization (EUA). This EUA will remain  in effect (meaning this test can be used) for the duration of the COVID-19 declaration under Section 564(b)(1) of the Act, 21 U.S.C.section 360bbb-3(b)(1), unless the authorization is terminated  or revoked sooner.       Influenza A by PCR NEGATIVE NEGATIVE Final   Influenza B by PCR NEGATIVE NEGATIVE Final    Comment: (NOTE) The Xpert Xpress SARS-CoV-2/FLU/RSV plus assay is intended as an aid in the diagnosis of influenza from Nasopharyngeal swab specimens and should not be used as a sole basis for treatment. Nasal washings and aspirates are unacceptable for Xpert Xpress SARS-CoV-2/FLU/RSV testing.  Fact Sheet for Patients: BloggerCourse.com  Fact Sheet for Healthcare Providers: SeriousBroker.it  This test is not yet approved or cleared by the [place] FDA and has been authorized for detection and/or diagnosis of SARS-CoV-2 by FDA under an Emergency Use Authorization (EUA). This EUA will remain in effect (meaning this test can be used) for the duration of the COVID-19 declaration under Section 564(b)(1) of the Act, 21 U.S.C. section 360bbb-3(b)(1), unless the authorization is  terminated or revoked.  Performed at [facility], [address], [place], [place] [address]          Radiology Studies: No results found.      Scheduled Meds: . Chlorhexidine Gluconate Cloth  6 each Topical Daily  . enoxaparin (LOVENOX) injection  30 mg Subcutaneous Q24H  . feeding supplement  237 mL Oral BID BM  . lidocaine  1 patch Transdermal Q24H  . losartan  25 mg Oral Daily  . metoprolol tartrate  25 mg Oral Q6H  . multivitamin with minerals  1 tablet Oral Daily  . pantoprazole  40 mg Oral Daily  . QUEtiapine  25 mg Oral Daily   Continuous Infusions:  Assessment & Plan:   Principal Problem:   Closed displaced fracture of right femoral neck (HCC) Active Problems:   Alzheimer's dementia without behavioral disturbance (HCC)   Atrial fibrillation and flutter (HCC)   Benign essential HTN   PAD (peripheral artery disease) (HCC)   Status post placement of cardiac pacemaker   Chronic anticoagulation   Preoperative clearance   Distal radius fracture, right   Protein-calorie malnutrition, severe   87 year old female with history of atrial fibrillation on Eliquis, diastolic heart failure, dementia, HTN, PAD pacemaker placement, presenting following a witnessed accidental fall  sustaining right hip fracture and right distal radius fracture.     Closed displaced fracture of right femoral neck (HCC) Rt distal radius fx   Witnessed accidental fall in patient on chronic anticoagulation   Preoperative clearance -Accidental fall witnessed by daughter and caregiver -Patient low to moderate risk of perioperative cardiopulmonary events Apply ice and elevate the hand  Pain control   lidocaine patch Continue to hold Eliquis  Was placed in sugar tong splint in ER 2/19-plan for right hip hemiarthroplasty and reduction of her right wrist tomorrow in OR by Dr. Odis Luster.  N.p.o. at midnight tonight. Lovenox discontinued in preparation for surgery tomorrow          Atrial fibrillation and flutter (with RVR in ER, resolved prior to admission)   Chronic anticoagulation -Patient was found to be in rapid A. 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 2/19-Urology Dr. Delana Meyer outpatient input was appreciated -Recommended keeping Foley in for at least a week with consideration of voiding trial at that point in time.  Patient will need to be more ambulatory as the probability of her voiding spontaneously is fairly low at this current time in the setting of medical illnesses, immobility and pharmacologic therapy Per urology if there was a fistula patient is not a surgical candidate for fistula repair and she currently has a Foley catheter in place which is draining well and functioning  In addition to the above, it sounds based on her history, that she may have some urethral atrophy versus kinking versus stenosis or perhaps that urethra is somewhat ectopic as a result in that the catheter may have just simply coiled in the vagina rather than the possibility of a vesicovaginal fistula.  Either way, we will not plan for any further work-up of this at this time.  No need for further imaging.     Alzheimer's dementia without behavioral disturbance (HCC) -Haldol as needed behavioral disturbance 2/19 continue quetiapine    Essential HTN Elevated at times Continue pain control Continue losartan Continue beta-blockers Continue IV as needed meds     Status post placement of cardiac pacemaker -No acute disease suspected      DVT prophylaxis: Lovenox Code Status: Full Family Communication: Daughter at bedside  Status is: Inpatient  Remains inpatient  appropriate because:Inpatient level of care appropriate due to severity of illness   Dispo: The patient is from: Home              Anticipated d/c is to: TBD              Anticipated d/c date is: 3 days              Patient currently is not medically stable to d/c.  Awaiting surgery on Sunday   Difficult to place patient No            LOS: 2 days   Time spent: 35 minutes with more than 50% on COC    Lynn Ito, MD [facility] Pager [phone]  If 7PM-7AM, please contact night-coverage 06/27/2020, 8:40 AM

## 2020-06-27 NOTE — Anesthesia Preprocedure Evaluation (Addendum)
Anesthesia Evaluation  Patient identified by MRN, date of birth, ID band Patient awake    Reviewed: Allergy & Precautions, H&P , NPO status , Patient's Chart, lab work & pertinent test results, reviewed documented beta blocker date and time   Airway Mallampati: II   Neck ROM: full    Dental  (+) Poor Dentition   Pulmonary neg pulmonary ROS,    Pulmonary exam normal        Cardiovascular Exercise Tolerance: Poor hypertension, On Medications + Peripheral Vascular Disease and +CHF  negative cardio ROS Normal cardiovascular exam+ dysrhythmias Atrial Fibrillation + pacemaker  Rhythm:regular Rate:Normal     Neuro/Psych PSYCHIATRIC DISORDERS Dementia negative neurological ROS  negative psych ROS   GI/Hepatic negative GI ROS, Neg liver ROS,   Endo/Other  negative endocrine ROS  Renal/GU negative Renal ROS  negative genitourinary   Musculoskeletal   Abdominal   Peds  Hematology negative hematology ROS (+)   Anesthesia Other Findings Past Medical History: No date: Atrial fibrillation (HCC) No date: CHF (congestive heart failure) (HCC) No date: Dementia (HCC)  Past Surgical History: No date: ABDOMINAL HYSTERECTOMY  BMI    Body Mass Index: 15.87 kg/m      Reproductive/Obstetrics negative OB ROS                            Anesthesia Physical Anesthesia Plan  ASA: III and emergent  Anesthesia Plan: Spinal   Post-op Pain Management:    Induction: Intravenous  PONV Risk Score and Plan: Treatment may vary due to age or medical condition and TIVA  Airway Management Planned: Nasal Cannula and Natural Airway  Additional Equipment:   Intra-op Plan:   Post-operative Plan:   Informed Consent: I have reviewed the patients History and Physical, chart, labs and discussed the procedure including the risks, benefits and alternatives for the proposed anesthesia with the patient or authorized  representative who has indicated his/her understanding and acceptance.     Dental Advisory Given  Plan Discussed with: CRNA  Anesthesia Plan Comments: (Off Eliquis x 3 days)       Anesthesia Quick Evaluation

## 2020-06-27 NOTE — Progress Notes (Signed)
SLP Cancellation Note  Patient Details Name: Kristen Osborn MRN: [id number] DOB: [birth year]   Cancelled treatment:       Reason Eval/Treat Not Completed: RN reports pt consumed 50% of lunch, SLP stopped by and spoke with daughter after lunch who reports pt did not want harder food (fries), requested soft foods. Downgrade to Dysphagia 3 (mechanical soft), thin liquids per family request, SLP to follow up early next week for tolerance.  Rondel Baton, [place], CCC-SLP Speech-Language Pathologist   Arlana Lindau 06/27/2020, 1:52 PM

## 2020-06-27 NOTE — Plan of Care (Signed)

## 2020-06-28 ENCOUNTER — Inpatient Hospital Stay: Payer: Medicare Other | Admitting: Anesthesiology

## 2020-06-28 ENCOUNTER — Inpatient Hospital Stay: Payer: Medicare Other

## 2020-06-28 ENCOUNTER — Encounter
Admission: EM | Disposition: A | Discharge status: Skilled Nursing Facility | Payer: Self-pay | Source: Home / Self Care | Attending: Internal Medicine

## 2020-06-28 DIAGNOSIS — S72001A Fracture of unspecified part of neck of right femur, initial encounter for closed fracture: Secondary | ICD-10-CM | POA: Diagnosis not present

## 2020-06-28 HISTORY — PX: CLOSED REDUCTION WRIST FRACTURE: SHX1091

## 2020-06-28 HISTORY — PX: HIP ARTHROPLASTY: SHX981

## 2020-06-28 LAB — BASIC METABOLIC PANEL
Anion gap: 9 (ref 5–15)
BUN: 30 mg/dL — ABNORMAL HIGH (ref 8–23)
CO2: 27 mmol/L (ref 22–32)
Calcium: 8.8 mg/dL — ABNORMAL LOW (ref 8.9–10.3)
Chloride: 89 mmol/L — ABNORMAL LOW (ref 98–111)
Creatinine, Ser: 0.66 mg/dL (ref 0.44–1.00)
GFR, Estimated: >60 mL/min (ref >60)
Glucose, Bld: 130 mg/dL — ABNORMAL HIGH (ref 70–99)
Potassium: 3.5 mmol/L (ref 3.5–5.1)
Sodium: 125 mmol/L — ABNORMAL LOW (ref 135–145)

## 2020-06-28 LAB — CBC
HCT: 34.6 % — ABNORMAL LOW (ref 36.0–46.0)
Hemoglobin: 11.8 g/dL — ABNORMAL LOW (ref 12.0–15.0)
MCH: 29.4 pg (ref 26.0–34.0)
MCHC: 34.1 g/dL (ref 30.0–36.0)
MCV: 86.1 fL (ref 80.0–100.0)
Platelets: 164 K/uL (ref 150–400)
RBC: 4.02 MIL/uL (ref 3.87–5.11)
RDW: 13.8 % (ref 11.5–15.5)
WBC: 11.1 K/uL — ABNORMAL HIGH (ref 4.0–10.5)
nRBC: 0 % (ref 0.0–0.2)

## 2020-06-28 SURGERY — HEMIARTHROPLASTY, HIP, DIRECT ANTERIOR APPROACH, FOR FRACTURE
Anesthesia: Spinal | Site: Wrist | Laterality: Right

## 2020-06-28 MED ORDER — ONDANSETRON HCL 4 MG/2ML IJ SOLN: 4.0000 mg | Freq: Once | INTRAMUSCULAR | Status: DC | PRN

## 2020-06-28 MED ORDER — BUPIVACAINE-EPINEPHRINE (PF) 0.25% -1:200000 IJ SOLN
INTRAMUSCULAR | Status: DC | PRN
  Administered 2020-06-28: 20 mL via PERINEURAL

## 2020-06-28 MED ORDER — METOPROLOL TARTRATE 5 MG/5ML IV SOLN: 2.0000 mg | INTRAVENOUS | Status: DC | PRN

## 2020-06-28 MED ORDER — FENTANYL CITRATE (PF) 100 MCG/2ML IJ SOLN: 25.0000 ug | INTRAMUSCULAR | Status: DC | PRN

## 2020-06-28 MED ORDER — ONDANSETRON HCL 4 MG PO TABS: 4.0000 mg | ORAL_TABLET | Freq: Four times a day (QID) | ORAL | Status: DC | PRN

## 2020-06-28 MED ORDER — METOCLOPRAMIDE HCL 10 MG PO TABS
5.0000 mg | ORAL_TABLET | Freq: Three times a day (TID) | ORAL | Status: DC | PRN
  Filled 2020-06-28: qty 1

## 2020-06-28 MED ORDER — CEFAZOLIN SODIUM-DEXTROSE 1-4 GM/50ML-% IV SOLN
INTRAVENOUS | Status: DC | PRN
  Administered 2020-06-28: 1 g via INTRAVENOUS

## 2020-06-28 MED ORDER — LACTATED RINGERS IV SOLN: INTRAVENOUS | Status: DC

## 2020-06-28 MED ORDER — LACTATED RINGERS IV SOLN: INTRAVENOUS | Status: DC | PRN

## 2020-06-28 MED ORDER — METOPROLOL TARTRATE 5 MG/5ML IV SOLN
INTRAVENOUS | Status: AC
  Administered 2020-06-28: 2 mg via INTRAVENOUS
  Filled 2020-06-28: qty 5

## 2020-06-28 MED ORDER — PROPOFOL 500 MG/50ML IV EMUL
INTRAVENOUS | Status: DC | PRN
  Administered 2020-06-28: 30 ug/kg/min via INTRAVENOUS

## 2020-06-28 MED ORDER — BUPIVACAINE HCL (PF) 0.5 % IJ SOLN
INTRAMUSCULAR | Status: DC | PRN
  Administered 2020-06-28: 2.5 mL

## 2020-06-28 MED ORDER — PROPOFOL 10 MG/ML IV BOLUS
INTRAVENOUS | Status: AC
  Filled 2020-06-28: qty 20

## 2020-06-28 MED ORDER — METOCLOPRAMIDE HCL 5 MG/ML IJ SOLN: 5.0000 mg | Freq: Three times a day (TID) | INTRAMUSCULAR | Status: DC | PRN

## 2020-06-28 MED ORDER — PHENYLEPHRINE HCL (PRESSORS) 10 MG/ML IV SOLN
INTRAVENOUS | Status: DC | PRN
  Administered 2020-06-28: 100 ug via INTRAVENOUS

## 2020-06-28 MED ORDER — DOCUSATE SODIUM 100 MG PO CAPS
100.0000 mg | ORAL_CAPSULE | Freq: Two times a day (BID) | ORAL | Status: DC
  Administered 2020-06-28 – 2020-07-03 (×9): 100 mg via ORAL
  Filled 2020-06-28 (×13): qty 1

## 2020-06-28 MED ORDER — ONDANSETRON HCL 4 MG/2ML IJ SOLN: 4.0000 mg | Freq: Four times a day (QID) | INTRAMUSCULAR | Status: DC | PRN

## 2020-06-28 SURGICAL SUPPLY — 53 items
BLADE SAGITTAL WIDE XTHICK NO (BLADE) ×3 IMPLANT
BRUSH SCRUB EZ  4% CHG (MISCELLANEOUS) ×2
BRUSH SCRUB EZ 4% CHG (MISCELLANEOUS) ×4 IMPLANT
CHLORAPREP W/TINT 26 (MISCELLANEOUS) ×3 IMPLANT
COVER WAND RF STERILE (DRAPES) ×3 IMPLANT
DRAPE 3/4 80X56 (DRAPES) ×3 IMPLANT
DRAPE C-ARM 42X72 X-RAY (DRAPES) ×3 IMPLANT
DRAPE C-ARM XRAY 36X54 (DRAPES) ×1 IMPLANT
DRAPE STERI IOBAN 125X83 (DRAPES) ×1 IMPLANT
DRSG AQUACEL AG ADV 3.5X10 (GAUZE/BANDAGES/DRESSINGS) IMPLANT
DRSG AQUACEL AG ADV 3.5X14 (GAUZE/BANDAGES/DRESSINGS) ×1 IMPLANT
ELECT BLADE 6.5 EXT (BLADE) ×3 IMPLANT
ELECT REM PT RETURN 9FT ADLT (ELECTROSURGICAL) ×3
ELECTRODE REM PT RTRN 9FT ADLT (ELECTROSURGICAL) ×2 IMPLANT
GAUZE SPONGE 4X4 12PLY STRL (GAUZE/BANDAGES/DRESSINGS) ×1 IMPLANT
GAUZE XEROFORM 1X8 LF (GAUZE/BANDAGES/DRESSINGS) ×2 IMPLANT
GLOVE INDICATOR 8.0 STRL GRN (GLOVE) ×3 IMPLANT
GLOVE SURG ORTHO LTX SZ8 (GLOVE) ×6 IMPLANT
GOWN STRL REUS W/ TWL LRG LVL3 (GOWN DISPOSABLE) ×2 IMPLANT
GOWN STRL REUS W/ TWL XL LVL3 (GOWN DISPOSABLE) ×2 IMPLANT
GOWN STRL REUS W/TWL LRG LVL3 (GOWN DISPOSABLE) ×1
GOWN STRL REUS W/TWL XL LVL3 (GOWN DISPOSABLE) ×1
HEAD 28MM 0 (Hips) ×1 IMPLANT
HOOD PEEL AWAY FLYTE STAYCOOL (MISCELLANEOUS) ×9 IMPLANT
IRRIGATION SURGIPHOR STRL (IV SOLUTION) IMPLANT
IV NS 1000ML (IV SOLUTION) ×1
IV NS 1000ML BAXH (IV SOLUTION) ×2 IMPLANT
KIT PATIENT CARE HANA TABLE (KITS) ×3 IMPLANT
KIT TURNOVER CYSTO (KITS) ×3 IMPLANT
LINER BIPOLAR 28X47MM (Liner) ×1 IMPLANT
MANIFOLD NEPTUNE II (INSTRUMENTS) ×3 IMPLANT
MAT ABSORB  FLUID 56X50 GRAY (MISCELLANEOUS) ×1
MAT ABSORB FLUID 56X50 GRAY (MISCELLANEOUS) ×2 IMPLANT
NDL SAFETY ECLIPSE 18X1.5 (NEEDLE) ×4 IMPLANT
NDL SPNL 20GX3.5 QUINCKE YW (NEEDLE) ×2 IMPLANT
NEEDLE HYPO 18GX1.5 SHARP (NEEDLE) ×2
NEEDLE HYPO 22GX1.5 SAFETY (NEEDLE) ×3 IMPLANT
NEEDLE SPNL 20GX3.5 QUINCKE YW (NEEDLE) ×3 IMPLANT
PACK HIP PROSTHESIS (MISCELLANEOUS) ×3 IMPLANT
PAD CAST CTTN 4X4 STRL (SOFTGOODS) IMPLANT
PADDING CAST BLEND 4X4 NS (MISCELLANEOUS) ×6 IMPLANT
PADDING CAST COTTON 4X4 STRL (SOFTGOODS) ×1
PILLOW ABDUCTION MEDIUM (MISCELLANEOUS) ×3 IMPLANT
PULSAVAC PLUS IRRIG FAN TIP (DISPOSABLE) ×3
SPLINT CAST 1 STEP 4X30 (MISCELLANEOUS) ×1 IMPLANT
STAPLER SKIN PROX 35W (STAPLE) ×3 IMPLANT
STEM STD COLLAR SZ7 POLARSTEM (Stem) ×1 IMPLANT
SUT BONE WAX W31G (SUTURE) ×3 IMPLANT
SUT DVC 2 QUILL PDO  T11 36X36 (SUTURE) ×1
SUT DVC 2 QUILL PDO T11 36X36 (SUTURE) ×2 IMPLANT
SUT VIC AB 2-0 CT1 18 (SUTURE) ×3 IMPLANT
SYR 20ML LL LF (SYRINGE) ×3 IMPLANT
TIP FAN IRRIG PULSAVAC PLUS (DISPOSABLE) ×2 IMPLANT

## 2020-06-28 NOTE — Anesthesia Procedure Notes (Signed)
Spinal  Patient location during procedure: OR Start time: 06/28/2020 10:08 AM Staffing Performed: resident/CRNA  Resident/CRNA: Shay Jhaveri, Anne Ng, CRNA Preanesthetic Checklist Completed: patient identified, IV checked, site marked, risks and benefits discussed, surgical consent, monitors and equipment checked, pre-op evaluation and timeout performed Spinal Block Patient position: left lateral decubitus Prep: DuraPrep Patient monitoring: heart rate, cardiac monitor, continuous pulse ox and blood pressure Approach: midline Location: L3-4 Injection technique: single-shot Needle Needle type: Sprotte  Needle gauge: 24 G Needle length: 9 cm Assessment Sensory level: T4 Additional Notes + csf, good flow, clear, - heme, - parathesia, tolerated well

## 2020-06-28 NOTE — Progress Notes (Addendum)
PROGRESS NOTE    VINEY ACOCELLA  RUE:[id number] DOB: [birth year] DOA: 06/24/2020 PCP: Danella Penton, MD    Brief Narrative:  Kristen Haver Cookis a 87 y.o.femalewith medical history significant foratrial fibrillation on Eliquis, diastolic heart failure, dementia, HTN, PAD pacemaker placement, was brought to the emergency room following an accidental fall.:X-ray right hip: Displaced right femoral neck fracture.  Patient is confused as she has dementia.  Laying in bed.  No complaints  2/18-indwelling Foley was attempted earlier this morning and catheter went into urethra and then during advancement it emerged out of vagina and there was concern for fistula.  Foley in place now.  After manipulation 1100 cc was drained.  Per daughter at bedside patient had issues with Foley placement in the past.  2/19-daughter at bedside. No overnight issues. Plan for surgery tomorrow. 2/20-pt went to surgery today with dr. Odis Luster.   Consultants:   Orthopedics  Procedures:   Antimicrobials:       Subjective: Smiling  interactive denies pain.   Objective: Vitals:   06/27/20 2023 06/27/20 2336 06/28/20 0401 06/28/20 0757  BP: (!) 145/89 (!) 161/93 (!) 160/77 (!) 153/106  Pulse: 88 92 94 86  Resp: 17 17 17 16   Temp: 99.7 F (37.6 C) 98.7 F (37.1 C) 98.7 F (37.1 C) 98.6 F (37 C)  TempSrc:      SpO2: 93% 92% 93% 91%  Weight:      Height:        Intake/Output Summary (Last 24 hours) at 06/28/2020 06/30/2020 Last data filed at 06/28/2020 0500 Gross per 24 hour  Intake --  Output 500 ml  Net -500 ml   Filed Weights   06/24/20 2319 06/25/20 0411  Weight: 45.4 kg 44.6 kg    Examination: Calm, nad cta no w/r/r Regular s1/s2, no gallop Soft benign, +bs No edema Mood and affect appropriate in current setting awake, at baseline confusion   Data Reviewed: I have personally reviewed following labs and imaging studies  CBC: Recent Labs  Lab 06/25/20 0022  WBC 5.1  NEUTROABS 3.0   HGB 12.3  HCT 37.1  MCV 88.5  PLT 140*   Basic Metabolic Panel: Recent Labs  Lab 06/25/20 0022 06/28/20 0431  NA 135 125*  K 3.7 3.5  CL 100 89*  CO2 25 27  GLUCOSE 129* 130*  BUN 21 30*  CREATININE 0.91 0.66  CALCIUM 9.1 8.8*   GFR: Estimated Creatinine Clearance: 32.2 mL/min (by C-G formula based on SCr of 0.66 mg/dL). Liver Function Tests: No results for input(s): AST, ALT, ALKPHOS, BILITOT, PROT, ALBUMIN in the last 168 hours. No results for input(s): LIPASE, AMYLASE in the last 168 hours. No results for input(s): AMMONIA in the last 168 hours. Coagulation Profile: Recent Labs  Lab 06/25/20 0022  INR 1.2   Cardiac Enzymes: No results for input(s): CKTOTAL, CKMB, CKMBINDEX, TROPONINI in the last 168 hours. BNP (last 3 results) No results for input(s): PROBNP in the last 8760 hours. HbA1C: No results for input(s): HGBA1C in the last 72 hours. CBG: Recent Labs  Lab 06/26/20 0815  GLUCAP 144*   Lipid Profile: No results for input(s): CHOL, HDL, LDLCALC, TRIG, CHOLHDL, LDLDIRECT in the last 72 hours. Thyroid Function Tests: No results for input(s): TSH, T4TOTAL, FREET4, T3FREE, THYROIDAB in the last 72 hours. Anemia Panel: No results for input(s): VITAMINB12, FOLATE, FERRITIN, TIBC, IRON, RETICCTPCT in the last 72 hours. Sepsis Labs: No results for input(s): PROCALCITON, LATICACIDVEN in the last 168 hours.  Recent Results (from the past 240 hour(s))  Resp Panel by RT-PCR (Flu A&B, Covid)     Status: None   Collection Time: 06/25/20  2:28 AM  Result Value Ref Range Status   SARS Coronavirus 2 by RT PCR NEGATIVE NEGATIVE Final    Comment: (NOTE) SARS-CoV-2 target nucleic acids are NOT DETECTED.  The SARS-CoV-2 RNA is generally detectable in upper respiratory specimens during the acute phase of infection. The lowest concentration of SARS-CoV-2 viral copies this assay can detect is 138 copies/mL. A negative result does not preclude SARS-Cov-2 infection and  should not be used as the sole basis for treatment or other patient management decisions. A negative result may occur with  improper specimen collection/handling, submission of specimen other than nasopharyngeal swab, presence of viral mutation(s) within the areas targeted by this assay, and inadequate number of viral copies(<138 copies/mL). A negative result must be combined with clinical observations, patient history, and epidemiological information. The expected result is Negative.  Fact Sheet for Patients:  BloggerCourse.com  Fact Sheet for Healthcare Providers:  SeriousBroker.it  This test is no t yet approved or cleared by the [place] FDA and  has been authorized for detection and/or diagnosis of SARS-CoV-2 by FDA under an Emergency Use Authorization (EUA). This EUA will remain  in effect (meaning this test can be used) for the duration of the COVID-19 declaration under Section 564(b)(1) of the Act, 21 U.S.C.section 360bbb-3(b)(1), unless the authorization is terminated  or revoked sooner.       Influenza A by PCR NEGATIVE NEGATIVE Final   Influenza B by PCR NEGATIVE NEGATIVE Final    Comment: (NOTE) The Xpert Xpress SARS-CoV-2/FLU/RSV plus assay is intended as an aid in the diagnosis of influenza from Nasopharyngeal swab specimens and should not be used as a sole basis for treatment. Nasal washings and aspirates are unacceptable for Xpert Xpress SARS-CoV-2/FLU/RSV testing.  Fact Sheet for Patients: BloggerCourse.com  Fact Sheet for Healthcare Providers: SeriousBroker.it  This test is not yet approved or cleared by the [place] FDA and has been authorized for detection and/or diagnosis of SARS-CoV-2 by FDA under an Emergency Use Authorization (EUA). This EUA will remain in effect (meaning this test can be used) for the duration of the COVID-19 declaration  under Section 564(b)(1) of the Act, 21 U.S.C. section 360bbb-3(b)(1), unless the authorization is terminated or revoked.  Performed at [facility], [address] [place], [place] [address]          Radiology Studies: No results found.      Scheduled Meds: . Chlorhexidine Gluconate Cloth  6 each Topical Daily  . feeding supplement  237 mL Oral BID BM  . lidocaine  1 patch Transdermal Q24H  . losartan  25 mg Oral Daily  . metoprolol tartrate  25 mg Oral Q6H  . multivitamin with minerals  1 tablet Oral Daily  . pantoprazole  40 mg Oral Daily  . QUEtiapine  25 mg Oral Daily   Continuous Infusions: .  ceFAZolin (ANCEF) IV      Assessment & Plan:   Principal Problem:   Closed displaced fracture of right femoral neck (HCC) Active Problems:   Alzheimer's dementia without behavioral disturbance (HCC)   Atrial fibrillation and flutter (HCC)   Benign essential HTN   PAD (peripheral artery disease) (HCC)   Status post placement of cardiac pacemaker   Chronic anticoagulation   Preoperative clearance   Distal radius fracture, right   Protein-calorie malnutrition, severe   87 year old  female with history of atrial fibrillation on Eliquis, diastolic heart failure, dementia, HTN, PAD pacemaker placement, presenting following a witnessed accidental fall sustaining right hip fracture and right distal radius fracture.     Closed displaced fracture of right femoral neck (HCC) Rt distal radius fx   Witnessed accidental fall in patient on chronic anticoagulation   Preoperative clearance -Accidental fall witnessed by daughter and caregiver -Patient low to moderate risk of perioperative cardiopulmonary events Apply ice and elevate the hand  Pain control   lidocaine patch Continue to hold Eliquis  Was placed in sugar tong splint in ER 2/20--patient is status right hip hemiarthroplasty and reduction of her right wrist today by Dr. Odis LusterBowers  Pain management Bowel  regimen         Atrial fibrillation and flutter (with RVR in ER, resolved prior to admission)   Chronic anticoagulation -Patient was found to be in rapid A. 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 2/19-Urology Dr. Delana MeyerBrandon's outpatient input was appreciated -Recommended keeping Foley in for at least a week with consideration of voiding trial at that point in time.  Patient will need to be more ambulatory as the probability of her voiding spontaneously is fairly low at this current time in the setting of medical illnesses, immobility and pharmacologic therapy Per urology if there was a fistula patient is not a surgical candidate for fistula repair and she currently has a Foley catheter in place which is draining well and functioning  In addition to the above, it sounds based on her history, that she may have some urethral atrophy versus kinking versus stenosis or perhaps that urethra is somewhat ectopic as a result in that the catheter may have just simply coiled in the vagina rather than the possibility of a vesicovaginal fistula.  Either way, we will not plan for any further work-up of this at this time.  No need for further imaging. 2/20-continue foley until pt more out of acute phase of surgery and able to ambulate.      Alzheimer's dementia without behavioral disturbance (HCC) -Haldol as needed behavioral disturbance 2/20-continue quetiapine    Essential HTN Elevated at times Continue pain control Continue losartan Continue beta-blockers Continue IV as needed meds     Status post placement of cardiac pacemaker -No acute disease  suspected      DVT prophylaxis: Lovenox Code Status: Full Family Communication: Daughters at bedside  Status is: Inpatient  Remains inpatient appropriate because:Inpatient level of care appropriate due to severity of illness   Dispo: The patient is from: Home              Anticipated d/c is to: TBD              Anticipated d/c date is: 3 days              Patient currently is not medically stable to d/c.  Needs PT evaluation   difficult to place patient No            LOS: 3 days   Time spent: 35 minutes with more than 50% on COC    Lynn ItoSahar Biance Moncrief, MD [facility] Pager [phone]  If 7PM-7AM, please contact night-coverage 06/28/2020, 8:38 AM

## 2020-06-28 NOTE — Anesthesia Postprocedure Evaluation (Deleted)
Anesthesia Post Note  Patient: Zya W Tarnow  Procedure(s) Performed: ARTHROPLASTY BIPOLAR HIP (HEMIARTHROPLASTY) (Right Hip) CLOSED REDUCTION WRIST (Right Wrist)  Patient location during evaluation: PACU Anesthesia Type: Spinal Level of consciousness: awake and alert Pain management: pain level controlled Vital Signs Assessment: post-procedure vital signs reviewed and stable Respiratory status: spontaneous breathing, nonlabored ventilation, respiratory function stable and patient connected to nasal cannula oxygen Cardiovascular status: blood pressure returned to baseline and stable Postop Assessment: no apparent nausea or vomiting Anesthetic complications: no   No complications documented.   Last Vitals:  Vitals:   06/28/20 1330 06/28/20 1352  BP: (!) 164/90 138/88  Pulse: 96 86  Resp: 16 15  Temp: 36.7 C 36.8 C  SpO2: 94% 93%    Last Pain:  Vitals:   06/28/20 1330  TempSrc: Temporal  PainSc: 0-No pain                 Yevette Edwards

## 2020-06-28 NOTE — Op Note (Signed)
06/28/2020  11:40 AM  PATIENT:  Kristen Osborn   MRN: [id number]  PRE-OPERATIVE DIAGNOSIS:  Displaced Subcapital fracture right hip   POST-OPERATIVE DIAGNOSIS: Same  Procedure: Right Hip Anterior Hip Hemiarthroplasty   Surgeon: Dola Argyle. Odis Luster, MD   Assist: None  Anesthesia: Spinal   EBL: 50 mL   Specimens: None   Drains: None   Components used: A size 7 Polarstem [employer], a 47 mm bipolar head    Description of the procedure in detail: After informed consent was obtained and the appropriate extremity marked in the pre-operative holding area, the patient was taken to the operating room and placed in the supine position on the fracture table. All pressure points were well padded and bilateral lower extremities were place in traction spars. The hip was prepped and draped in standard sterile fashion. A spinal anesthetic had been delivered by the anesthesia team. The skin and subcutaneous tissues were injected with a mixture of Marcaine with epinephrine for post-operative pain. A longitudinal incision approximately 10 cm in length was carried out from the anterior superior iliac spine to the greater trochanter. The tensor fascia was divided and blunt dissection was taken down to the level of the joint capsule. The lateral circumflex vessels were cauterized. Deep retractors were placed and a portion of the anterior capsule was excised. Using fluoroscopy the neck cut was planned and carried out with a sagittal saw. The head was passed from the field with use of a corkscrew and hip skid. Deep retractors were placed along the acetabulum and bony and soft tissue debris was removed.   Attention was then turned to the proximal femur. The leg was placed in extension and external rotation. The canal was opened and sequentially broached to a size 7. The trial components were placed and the hip relocated. The components were found to be in good position using fluoroscopy. The hip was dislocated  and the trial components removed. The final components were impacted in to position and the hip relocated. The final components were again check with fluoroscopy and found to be in good position. Hemostasis was achieved with electrocautery. The deep capsule was injected with Marcaine and epinephrine. The wound was irrigated with bacitracin laced normal saline and the tensor fascia closed with #2 Quill suture. The subcutaneous tissues were closed with 2-0 vicryl and staples for the skin. A sterile dressing was applied and an abduction pillow. Patient tolerated the procedure well and there were no apparent complication. Patient was taken to the recovery room in good condition.    Dola Argyle. Odis Luster, MD  06/28/2020 11:40 AM     06/28/2020  11:40 AM  PATIENT:  [name]    PRE-OPERATIVE DIAGNOSIS:  Right wrist fracture  POST-OPERATIVE DIAGNOSIS:  Same  PROCEDURE:  Closed reduction and splinting of right distal radius  SURGEON:  Lyndle Herrlich, MD  ANESTHESIA:   Sedation  PREOPERATIVE INDICATIONS:  Kristen Osborn is a  87 y.o. female with a diagnosis of Right wrist Fracture and hip fracture who presents for conservative treatment.  The risks benefits and alternatives were discussed with the family preoperatively including but not limited to the risks of infection, bleeding, nerve injury, cardiopulmonary complications, the need for revision surgery, among others, and the patient was willing to proceed.   OPERATIVE FINDINGS: closed, displaced distal radius fracture, right  OPERATIVE PROCEDURE: The right wrist was gently manipulated volar and ulnar to improve the shortening and deformity. A well padded sugar tong splint  was applied. She tolerated the procedure well.  Dola Argyle. Odis Luster, MD

## 2020-06-28 NOTE — Progress Notes (Incomplete)
Pt arrived to PACU  via stretcher from OR  Sleepy responds to verbal stimuli, assessing spinal. Pt became agitated and aggressive, trying to hit staff members.After becoming aggressive B/P elevated and HR elevated. Pt told staff to get out of her house.

## 2020-06-28 NOTE — H&P (Signed)
The patient has been re-examined, and the chart reviewed, and there have been no interval changes to the documented history and physical.  Plan a right hip hemiarthroplasty and right wrist closed reduction today.  Anesthesia is consulted regarding a peripheral nerve block for post-operative pain.  The risks, benefits, and alternatives have been discussed at length, and the patient is willing to proceed.

## 2020-06-28 NOTE — Transfer of Care (Signed)
Immediate Anesthesia Transfer of Care Note  Patient: Kristen Osborn  Procedure(s) Performed: ARTHROPLASTY BIPOLAR HIP (HEMIARTHROPLASTY) (Right Hip) CLOSED REDUCTION WRIST (Right Wrist)  Patient Location: PACU  Anesthesia Type:Spinal  Level of Consciousness: awake  Airway & Oxygen Therapy: Patient Spontanous Breathing and Patient connected to face mask oxygen  Post-op Assessment: Report given to RN and Post -op Vital signs reviewed and stable  Post vital signs: Reviewed and stable  Last Vitals:  Vitals Value Taken Time  BP    Temp    Pulse    Resp    SpO2      Last Pain:  Vitals:   06/28/20 0800  TempSrc:   PainSc: 0-No pain         Complications: No complications documented.

## 2020-06-29 DIAGNOSIS — S72001A Fracture of unspecified part of neck of right femur, initial encounter for closed fracture: Secondary | ICD-10-CM | POA: Diagnosis not present

## 2020-06-29 LAB — BASIC METABOLIC PANEL
Anion gap: 8 (ref 5–15)
BUN: 32 mg/dL — ABNORMAL HIGH (ref 8–23)
CO2: 26 mmol/L (ref 22–32)
Calcium: 8.4 mg/dL — ABNORMAL LOW (ref 8.9–10.3)
Chloride: 90 mmol/L — ABNORMAL LOW (ref 98–111)
Creatinine, Ser: 0.63 mg/dL (ref 0.44–1.00)
GFR, Estimated: >60 mL/min (ref >60)
Glucose, Bld: 155 mg/dL — ABNORMAL HIGH (ref 70–99)
Potassium: 3.9 mmol/L (ref 3.5–5.1)
Sodium: 124 mmol/L — ABNORMAL LOW (ref 135–145)

## 2020-06-29 LAB — CBC
HCT: 31.7 % — ABNORMAL LOW (ref 36.0–46.0)
Hemoglobin: 11 g/dL — ABNORMAL LOW (ref 12.0–15.0)
MCH: 29.5 pg (ref 26.0–34.0)
MCHC: 34.7 g/dL (ref 30.0–36.0)
MCV: 85 fL (ref 80.0–100.0)
Platelets: 177 10*3/uL (ref 150–400)
RBC: 3.73 MIL/uL — ABNORMAL LOW (ref 3.87–5.11)
RDW: 13.9 % (ref 11.5–15.5)
WBC: 12.6 10*3/uL — ABNORMAL HIGH (ref 4.0–10.5)
nRBC: 0 % (ref 0.0–0.2)

## 2020-06-29 MED ORDER — FUROSEMIDE 20 MG PO TABS
10.0000 mg | ORAL_TABLET | Freq: Every day | ORAL | Status: DC
  Administered 2020-06-29 – 2020-06-30 (×2): 10 mg via ORAL
  Filled 2020-06-29 (×2): qty 1

## 2020-06-29 MED ORDER — POLYETHYLENE GLYCOL 3350 17 G PO PACK
17.0000 g | PACK | Freq: Every day | ORAL | Status: DC
  Administered 2020-06-29 – 2020-06-30 (×2): 17 g via ORAL
  Filled 2020-06-29 (×2): qty 1

## 2020-06-29 MED ORDER — SENNOSIDES-DOCUSATE SODIUM 8.6-50 MG PO TABS
2.0000 | ORAL_TABLET | Freq: Two times a day (BID) | ORAL | Status: DC
  Administered 2020-06-29 – 2020-06-30 (×3): 2 via ORAL
  Administered 2020-06-30: 1 via ORAL
  Administered 2020-07-01 – 2020-07-03 (×4): 2 via ORAL
  Filled 2020-06-29 (×8): qty 2

## 2020-06-29 NOTE — Progress Notes (Signed)
Please be advised that the patient [name] DOB- Jan 11, 1927 will require a short-term nursing home stay - anticipated 30 days or less for rehabilitation and strengthening.  The plan is for return home.

## 2020-06-29 NOTE — [place] FL2 (Signed)
[place] MEDICAID FL2 LEVEL OF CARE SCREENING TOOL     IDENTIFICATION  Patient Name: Kristen Osborn Birthdate: May 29, 1926 Sex: female Admission Date (Current Location): 06/24/2020  [place] and [place] Number:  Chiropodist and Address:  [facility], [address], [place], [place] [address]      Provider Number: [id number]  Attending Physician Name and Address:  Lynn Ito, MD  Relative Name and Phone Number:  [name] [phone]    Current Level of Care: Hospital Recommended Level of Care: Skilled Nursing Facility Prior Approval Number:    Date Approved/Denied:   PASRR Number:    Discharge Plan: SNF    Current Diagnoses: Patient Active Problem List   Diagnosis Date Noted  . Protein-calorie malnutrition, severe 06/26/2020  . Chronic anticoagulation 06/25/2020  . Closed displaced fracture of right femoral neck (HCC) 06/25/2020  . Preoperative clearance 06/25/2020  . Distal radius fracture, right 06/25/2020  . Closed right hip fracture (HCC) 06/25/2020  . PAD (peripheral artery disease) (HCC) 10/17/2018  . Status post placement of cardiac pacemaker 10/17/2018  . Alzheimer's dementia without behavioral disturbance (HCC) 04/17/2018  . Atrial fibrillation and flutter (HCC) 04/17/2018  . Benign essential HTN 03/20/2014    Orientation RESPIRATION BLADDER Height & Weight     Self,Place  Normal External catheter Weight: 44.6 kg Height:  5\' 6"  (167.6 cm)  BEHAVIORAL SYMPTOMS/MOOD NEUROLOGICAL BOWEL NUTRITION STATUS      Continent Diet (Regular)  AMBULATORY STATUS COMMUNICATION OF NEEDS Skin   Extensive Assist Verbally Surgical wounds                       Personal Care Assistance Level of Assistance  Bathing,Feeding,Dressing Bathing Assistance: Maximum assistance Feeding assistance: Limited assistance Dressing Assistance: Maximum assistance     Functional Limitations Info  Sight,Hearing,Speech Sight Info:  Adequate Hearing Info: Adequate Speech Info: Adequate    SPECIAL CARE FACTORS FREQUENCY  PT (By licensed PT),OT (By licensed OT)                    Contractures Contractures Info: Not present    Additional Factors Info  Code Status,Allergies Code Status Info: Full Allergies Info: Lisinopril           Current Medications (06/29/2020):  This is the current hospital active medication list Current Facility-Administered Medications  Medication Dose Route Frequency Provider Last Rate Last Admin  . bisacodyl (DULCOLAX) EC tablet 5 mg  5 mg Oral Daily PRN 07/01/2020, MD      . ceFAZolin (ANCEF) IVPB 2g/100 mL premix  2 g Intravenous 30 min Pre-Op Lyndle Herrlich, MD      . Chlorhexidine Gluconate Cloth 2 % PADS 6 each  6 each Topical Daily Lyndle Herrlich, MD   6 each at 06/29/20 [phone]  . docusate sodium (COLACE) capsule 100 mg  100 mg Oral BID 1829, MD   100 mg at 06/29/20 07/01/20  . feeding supplement (ENSURE ENLIVE / ENSURE PLUS) liquid 237 mL  237 mL Oral BID BM 9371, MD   237 mL at 06/29/20 0926  . hydrALAZINE (APRESOLINE) injection 10 mg  10 mg Intravenous Q6H PRN 07/01/20, MD   10 mg at 06/26/20 2052  . HYDROcodone-acetaminophen (NORCO/VICODIN) 5-325 MG per tablet 1-2 tablet  1-2 tablet Oral Q6H PRN 2053, MD   1 tablet at 06/28/20 1358  . lidocaine (LIDODERM) 5 % 1 patch  1 patch Transdermal Q24H Lyndle Herrlich, MD   1 patch at 06/27/20 1436  . losartan (COZAAR) tablet 25 mg  25 mg Oral Daily Lyndle Herrlich, MD   25 mg at 06/29/20 9622  . metoCLOPramide (REGLAN) tablet 5-10 mg  5-10 mg Oral Q8H PRN Lyndle Herrlich, MD       Or  . metoCLOPramide (REGLAN) injection 5-10 mg  5-10 mg Intravenous Q8H PRN Lyndle Herrlich, MD      . metoprolol tartrate (LOPRESSOR) tablet 25 mg  25 mg Oral Q6H Lyndle Herrlich, MD   25 mg at 06/29/20 2979  . morphine 2 MG/ML injection 0.5 mg  0.5 mg Intravenous Q2H PRN Lyndle Herrlich, MD   0.5 mg at 06/28/20  0422  . multivitamin with minerals tablet 1 tablet  1 tablet Oral Daily Lyndle Herrlich, MD   1 tablet at 06/29/20 [phone]  . ondansetron (ZOFRAN) tablet 4 mg  4 mg Oral Q6H PRN Lyndle Herrlich, MD       Or  . ondansetron [facility]) injection 4 mg  4 mg Intravenous Q6H PRN Lyndle Herrlich, MD      . pantoprazole (PROTONIX) EC tablet 40 mg  40 mg Oral Daily Lyndle Herrlich, MD   40 mg at 06/29/20 1941  . polyethylene glycol (MIRALAX / GLYCOLAX) packet 17 g  17 g Oral Daily Lynn Ito, MD      . QUEtiapine (SEROQUEL) tablet 25 mg  25 mg Oral Daily Lyndle Herrlich, MD   25 mg at 06/29/20 7408  . senna-docusate (Senokot-S) tablet 2 tablet  2 tablet Oral BID Lynn Ito, MD         Discharge Medications: Please see discharge summary for a list of discharge medications.  Relevant Imaging Results:  Relevant Lab Results:   Additional Information SS# [id number]  Trenton Founds, RN

## 2020-06-29 NOTE — Anesthesia Postprocedure Evaluation (Signed)
Anesthesia Post Note  Patient: Kristen Osborn  Procedure(s) Performed: ARTHROPLASTY BIPOLAR HIP (HEMIARTHROPLASTY) (Right Hip) CLOSED REDUCTION WRIST (Right Wrist)  Patient location during evaluation: Nursing Unit Anesthesia Type: Spinal Level of consciousness: awake and alert Pain management: pain level controlled Vital Signs Assessment: post-procedure vital signs reviewed and stable Respiratory status: spontaneous breathing, respiratory function stable and patient connected to nasal cannula oxygen Cardiovascular status: blood pressure returned to baseline and stable Postop Assessment: no headache, no backache and no apparent nausea or vomiting Anesthetic complications: no   No complications documented.   Last Vitals:  Vitals:   06/29/20 0410 06/29/20 0755  BP: (!) 166/82 (!) 145/94  Pulse: 84 100  Resp: 17 18  Temp: 37.2 C 37.9 C  SpO2: 92% 91%    Last Pain:  Vitals:   06/29/20 0755  TempSrc: Oral  PainSc:                  Rica Mast

## 2020-06-29 NOTE — TOC Initial Note (Signed)
[facility]) - Initial/Assessment Note    Patient Details  Name: Kristen Osborn MRN: [id number] Date of Birth: 09-21-1926  Transition of Care [facility]) CM/SW Contact:    Shelbie Ammons, RN Phone Number: 06/29/2020, 2:34 PM  Clinical Narrative:   RNCM met with patient and daughter Stanton Kidney "Kristen Osborn" Jerline Pain in the room. Patient is resting quietly with eyes closed and in no acute distress. Daughter Jan reports that patient lives in her own home and and she and her 3 sisters stay with her 24 hours a day. She reports that they are open to the idea of looking into skilled nursing but may ultimately decide to take her home and they report they will plan to take her home after rehab.  RNCM completed PASSR and will fax in additional information when available. FL2 is completed and bed search was initiated.      Expected Discharge Plan: Skilled Nursing Facility Barriers to Discharge: No Barriers Identified   Patient Goals and CMS Choice        Expected Discharge Plan and Services Expected Discharge Plan: [place] arrangements for the past 2 months: [place]                                      Prior Living Arrangements/Services Living arrangements for the past 2 months: [place] Lives with:: Self Patient language and need for interpreter reviewed:: Yes Do you feel safe going back to the place where you live?: Yes      Need for Family Participation in Patient Care: Yes (Comment) Care giver support system in place?: Yes (comment)   Criminal Activity/Legal Involvement Pertinent to Current Situation/Hospitalization: No - Comment as needed  Activities of Daily Living      Permission Sought/Granted                  Emotional Assessment         Alcohol / Substance Use: Not Applicable Psych Involvement: No (comment)  Admission diagnosis:  Closed right hip fracture ([place]) [S72.001A] Fall, initial  encounter [W19.XXXA] Closed displaced fracture of right femoral neck (HCC) [S72.001A] Closed fracture of distal end of right radius, unspecified fracture morphology, initial encounter [S52.501A] Patient Active Problem List   Diagnosis Date Noted  . Protein-calorie malnutrition, severe 06/26/2020  . Chronic anticoagulation 06/25/2020  . Closed displaced fracture of right femoral neck ([place]) 06/25/2020  . Preoperative clearance 06/25/2020  . Distal radius fracture, right 06/25/2020  . Closed right hip fracture ([place]) 06/25/2020  . PAD (peripheral artery disease) ([place]) 10/17/2018  . Status post placement of cardiac pacemaker 10/17/2018  . Alzheimer's dementia without behavioral disturbance ([place]) 04/17/2018  . Atrial fibrillation and flutter ([place]) 04/17/2018  . Benign essential HTN 03/20/2014   PCP:  Rusty Aus, MD Pharmacy:  No Pharmacies Listed    Social Determinants of Health (SDOH) Interventions    Readmission Risk Interventions No flowsheet data found.

## 2020-06-29 NOTE — Evaluation (Signed)
Physical Therapy Evaluation Patient Details Name: Kristen Osborn MRN: [id number] DOB: [birth year] Today's Date: 06/29/2020   History of Present Illness  Pt is a 87 y.o. female presenting to hospital 2/16 s/p fall at home c/o R hip and R hand pain.  Pt admitted with closed displaced fx of R femoral neck, distal radius fx R, a-fib and a-flutter with RVR in ER; pt also noted with urinary retention.  Pt s/p 2/16 R hip anterior hip hemiarthroplasty and closed reduction and splinting of R distal radius.  PMH includes dementia, a-fib on Eliquis, CHF s/p pacemaker, PAD.  Clinical Impression  Prior to hospital admission, pt was independent with ambulation; lives in 1 level home with 2 STE with R railing (can stay main level); currently has 24/7 assist from 3 daughters that alternate assisting pt.  Pt's R thumb/fingers appearing swollen and R thumb bruised--nurse notified and came to assess (nurse reports looks better than yesterday); therapist elevated pt's R UE above heart end of session to promote improvement in swelling.  Pt very drowsy beginning of session and had a lot of difficulty keeping her eyes open but pt appearing alert once engaged with bed mobility (max assist) and sitting edge of bed.  Min assist for sitting balance d/t posterior lean.  Pt reporting feeling like she was going to "pass out" sitting edge of bed so therapist assisted pt to laying back down in bed (BP 147/83) and symptoms resolved with resting in bed (and then pt fell back asleep).  Pt would benefit from skilled PT to address noted impairments and functional limitations (see below for any additional details).  Upon hospital discharge, pt would benefit from SNF (pt's daughter voicing concerns regarding being able to see pt at SNF d/t COVID and discussing possible option of discharge home instead with 24/7 assist from daughters who are all retired): TOC notified.  Currently frequency set at QD but will monitor pt's status and adjust  frequency as deemed appropriate.    Follow Up Recommendations SNF    Equipment Recommendations  Wheelchair (measurements PT);Wheelchair cushion (measurements PT)    Recommendations for Other Services OT consult     Precautions / Restrictions Precautions Precautions: Fall;Anterior Hip Precaution Booklet Issued: Yes (comment) Restrictions Weight Bearing Restrictions: Yes RUE Weight Bearing: Non weight bearing RLE Weight Bearing: Weight bearing as tolerated      Mobility  Bed Mobility Overal bed mobility: Needs Assistance Bed Mobility: Supine to Sit;Sit to Supine     Supine to sit: Max assist;HOB elevated Sit to supine: Max assist;HOB elevated   General bed mobility comments: assist for trunk and B LE's; vc's to assist    Transfers                 General transfer comment: unable to assess d/t pt c/o feeling like she was going to "pass out" sitting edge of bed  Ambulation/Gait             General Gait Details: unable to assess d/t pt c/o feeling like she was going to "pass out" sitting edge of bed  Stairs            Wheelchair Mobility    Modified Rankin (Stroke Patients Only)       Balance Overall balance assessment: Needs assistance Sitting-balance support: Single extremity supported;Feet supported Sitting balance-Leahy Scale: Poor Sitting balance - Comments: min assist to steady d/t posterior lean in sitting Postural control: Posterior lean  Pertinent Vitals/Pain Pain Assessment: Faces Faces Pain Scale: No hurt (6/10 with mobility) Pain Location: R hip/thigh Pain Descriptors / Indicators: Grimacing;Operative site guarding Pain Intervention(s): Limited activity within patient's tolerance;Monitored during session;Repositioned  Vitals (HR and O2 on room air) stable and WFL throughout treatment session.    Home Living Family/patient expects to be discharged to:: Private residence Living  Arrangements: Alone Available Help at Discharge: Family;Available 24 hours/day Type of Home: House Home Access: Stairs to enter Entrance Stairs-Rails: Right Entrance Stairs-Number of Steps: 2 steps with R railing into home (then has 3 steps with R railing to kitchen but can stay on one level) Home Layout: One level Home Equipment: Walker - 2 wheels;Walker - standard;Bedside commode;Shower seat - built in;Grab bars - tub/shower      Prior Function Level of Independence: Needs assistance   Gait / Transfers Assistance Needed: Independent with ambulation; no other recent falls     Comments: 3 daughters take turns providing 24/7 assist     Hand Dominance        Extremity/Trunk Assessment   Upper Extremity Assessment Upper Extremity Assessment: RUE deficits/detail;LUE deficits/detail RUE Deficits / Details: in splint with ace wrap bandage; fingers appearing swollen and thumb bruised (nurse notified and came to assess) RUE: Unable to fully assess due to immobilization LUE Deficits / Details: generalized weakness    Lower Extremity Assessment Lower Extremity Assessment: Difficult to assess due to impaired cognition (pt did not follow commands to assess; appearing at least 3/5 AROM L LE)    Cervical / Trunk Assessment Cervical / Trunk Assessment: Normal  Communication   Communication: No difficulties  Cognition Arousal/Alertness: Lethargic (pt very drowsy during session with difficulty staying awake) Behavior During Therapy: WFL for tasks assessed/performed Overall Cognitive Status: History of cognitive impairments - at baseline                                 General Comments: Oriented to at least person (pt drowsy and did not answer any other question)      General Comments   Nursing cleared pt for participation in physical therapy.  Pt agreeable to PT session.  Pt's daughters present during session (daughter's switched staying with pt in middle of  session).    Exercises  Unable to keep pt awake to attempt ex's   Assessment/Plan    PT Assessment Patient needs continued PT services  PT Problem List Decreased strength;Decreased activity tolerance;Decreased balance;Decreased mobility;Decreased knowledge of use of DME;Decreased knowledge of precautions;Pain;Decreased skin integrity       PT Treatment Interventions DME instruction;Gait training;Stair training;Functional mobility training;Therapeutic activities;Therapeutic exercise;Balance training;Patient/family education    PT Goals (Current goals can be found in the Care Plan section)  Acute Rehab PT Goals Patient Stated Goal: to improve mobility PT Goal Formulation: With patient Time For Goal Achievement: 07/13/20 Potential to Achieve Goals: Fair    Frequency 7X/week   Barriers to discharge Decreased caregiver support Level of physical assist    Co-evaluation               AM-PAC PT "6 Clicks" Mobility  Outcome Measure Help needed turning from your back to your side while in a flat bed without using bedrails?: A Little Help needed moving from lying on your back to sitting on the side of a flat bed without using bedrails?: A Lot Help needed moving to and from a bed to a chair (including a wheelchair)?: A  Lot Help needed standing up from a chair using your arms (e.g., wheelchair or bedside chair)?: A Lot Help needed to walk in hospital room?: Total Help needed climbing 3-5 steps with a railing? : Total 6 Click Score: 11    End of Session   Activity Tolerance: Other (comment) (Limited d/t pt's drowsiness and feeling like she was going to "pass out" in sitting) Patient left: in bed;with call bell/phone within reach;with bed alarm set;with family/visitor present;Other (comment);with SCD's reapplied (B heels floating via pillow support; R UE elevated on 2 pillows (above heart to decrease swelling in R fingers)) Nurse Communication: Mobility status;Precautions;Weight  bearing status;Other (comment) (Pt's c/o feeling like she was going to "pass out" in sitting and pt's BP; pt's R hand/finger swelling and R thumb bruised) PT Visit Diagnosis: Other abnormalities of gait and mobility (R26.89);Muscle weakness (generalized) (M62.81);History of falling (Z91.81);Difficulty in walking, not elsewhere classified (R26.2);Pain Pain - Right/Left: Right Pain - part of body: Hip    Time: 1025-1100 PT Time Calculation (min) (ACUTE ONLY): 35 min   Charges:   PT Evaluation $PT Eval Low Complexity: 1 Low PT Treatments $Therapeutic Activity: 8-22 mins       Hendricks Limes, PT 06/29/20, 1:22 PM

## 2020-06-29 NOTE — Consult Note (Signed)
[address] [place], [place] [address] Phone [phone]. Fax [phone]  Date: 06/29/2020                  Patient Name:  Kristen Osborn  MRN: [id number]  DOB: 1926-09-06  Age / Sex: 87 y.o., female         PCP: Danella Penton, MD                 Service Requesting Consult: IM/ Lynn Ito, MD                 Reason for Consult: Hyponatremia            History of Present Illness: Patient is a 87 y.o. female was admitted to [facility] on 06/24/2020  Patient is s/p right hip anterior hemiarthroplasty Nephrology consult for hyponatremia Patient has dementia and is not able to provide any meaningful information.  All information is obtained from patient's daughter who is at bedside. Patient had an accidental fall and diagnosed with displaced right femoral neck fracture and distal radius fracture. Generally at home, patient is able to recognize her family members.  She eats pretty well and is able to drink fluids without nausea or vomiting   Medications: Outpatient medications: Medications Prior to Admission  Medication Sig Dispense Refill Last Dose  . Cyanocobalamin 1000 MCG LOZG Place 1 lozenge under the tongue daily.   06/24/2020 at 0900  . ELIQUIS 5 MG TABS tablet Take 5 mg by mouth 2 (two) times daily.   06/24/2020 at 0900  . losartan (COZAAR) 25 MG tablet Take 25 mg by mouth daily.   06/24/2020 at 0900  . metoprolol tartrate (LOPRESSOR) 25 MG tablet Take 12.5 mg by mouth 2 (two) times daily.   06/24/2020 at 0900  . omeprazole (PRILOSEC) 20 MG capsule Take 20 mg by mouth daily.   06/24/2020 at 0900  . potassium chloride (KLOR-CON) 8 MEQ tablet Take 8 mEq by mouth daily.   06/24/2020 at 0900  . QUEtiapine (SEROQUEL) 25 MG tablet Take 25 mg by mouth daily. Takes at 5 pm   Past Week at Unknown time    Current medications: Current Facility-Administered Medications  Medication Dose Route Frequency Provider Last Rate Last Admin  . bisacodyl (DULCOLAX) EC tablet 5 mg  5  mg Oral Daily PRN Lyndle Herrlich, MD      . ceFAZolin (ANCEF) IVPB 2g/100 mL premix  2 g Intravenous 30 min Pre-Op Lyndle Herrlich, MD      . Chlorhexidine Gluconate Cloth 2 % PADS 6 each  6 each Topical Daily Lyndle Herrlich, MD   6 each at 06/29/20 [phone]  . docusate sodium (COLACE) capsule 100 mg  100 mg Oral BID Lyndle Herrlich, MD   100 mg at 06/29/20 7412  . feeding supplement (ENSURE ENLIVE / ENSURE PLUS) liquid 237 mL  237 mL Oral BID BM Lyndle Herrlich, MD   237 mL at 06/29/20 0926  . hydrALAZINE (APRESOLINE) injection 10 mg  10 mg Intravenous Q6H PRN Lyndle Herrlich, MD   10 mg at 06/26/20 2052  . HYDROcodone-acetaminophen (NORCO/VICODIN) 5-325 MG per tablet 1-2 tablet  1-2 tablet Oral Q6H PRN Lyndle Herrlich, MD   1 tablet at 06/28/20 1358  . lidocaine (LIDODERM) 5 % 1 patch  1 patch Transdermal Q24H Lyndle Herrlich, MD   1 patch at 06/27/20 1436  . losartan (COZAAR) tablet 25 mg  25 mg Oral Daily Cassell Smiles  R, MD   25 mg at 06/29/20 0923  . metoCLOPramide (REGLAN) tablet 5-10 mg  5-10 mg Oral Q8H PRN Lyndle HerrlichBowers, James R, MD       Or  . metoCLOPramide (REGLAN) injection 5-10 mg  5-10 mg Intravenous Q8H PRN Lyndle HerrlichBowers, James R, MD      . metoprolol tartrate (LOPRESSOR) tablet 25 mg  25 mg Oral Q6H Lyndle HerrlichBowers, James R, MD   25 mg at 06/29/20 [id number]  . morphine 2 MG/ML injection 0.5 mg  0.5 mg Intravenous Q2H PRN Lyndle HerrlichBowers, James R, MD   0.5 mg at 06/28/20 0422  . multivitamin with minerals tablet 1 tablet  1 tablet Oral Daily Lyndle HerrlichBowers, James R, MD   1 tablet at 06/29/20 [phone]  . ondansetron (ZOFRAN) tablet 4 mg  4 mg Oral Q6H PRN Lyndle HerrlichBowers, James R, MD       Or  . ondansetron [facility](ZOFRAN) injection 4 mg  4 mg Intravenous Q6H PRN Lyndle HerrlichBowers, James R, MD      . pantoprazole (PROTONIX) EC tablet 40 mg  40 mg Oral Daily Lyndle HerrlichBowers, James R, MD   40 mg at 06/29/20 [id number]  . QUEtiapine (SEROQUEL) tablet 25 mg  25 mg Oral Daily Lyndle HerrlichBowers, James R, MD   25 mg at 06/29/20 [id number]  . senna-docusate (Senokot-S) tablet 1 tablet  1 tablet  Oral QHS PRN Lyndle HerrlichBowers, James R, MD          Allergies: Allergies  Allergen Reactions  . Lisinopril Other (See Comments)      Past Medical History: Past Medical History:  Diagnosis Date  . Atrial fibrillation (HCC)   . CHF (congestive heart failure) (HCC)   . Dementia [facility](HCC)      Past Surgical History: Past Surgical History:  Procedure Laterality Date  . ABDOMINAL HYSTERECTOMY       Family History: History reviewed. No pertinent family history.   Social History: Social History   Socioeconomic History  . Marital status: Married    Spouse name: Not on file  . Number of children: Not on file  . Years of education: Not on file  . Highest education level: Not on file  Occupational History  . Not on file  Tobacco Use  . Smoking status: Not on file  . Smokeless tobacco: Not on file  Substance and Sexual Activity  . Alcohol use: Not on file  . Drug use: Not on file  . Sexual activity: Not on file  Other Topics Concern  . Not on file  Social History Narrative  . Not on file   Social Determinants of Health   Financial Resource Strain: Not on file  Food Insecurity: Not on file  Transportation Needs: Not on file  Physical Activity: Not on file  Stress: Not on file  Social Connections: Not on file  Intimate Partner Violence: Not on file     Review of Systems: Unreliable.  See HPI Gen:  HEENT:  CV:  Resp:  GI: GU :  MS:  Derm:    Psych: Heme:  Neuro:  Endocrine  Vital Signs: Blood pressure (!) 145/94, pulse 100, temperature 100.3 F (37.9 C), temperature source Oral, resp. rate 18, height 5\' 6"  (1.676 m), weight 44.6 kg, SpO2 91 %.   Intake/Output Summary (Last 24 hours) at 06/29/2020 1216 Last data filed at 06/29/2020 0557 Gross per 24 hour  Intake 1242.03 ml  Output 600 ml  Net 642.03 ml    Weight trends: [employer]   06/24/20 2319 06/25/20 0411  Weight: 45.4 kg 44.6 kg   Physical Exam: General:  No acute distress, laying in the bed,  frail, elderly  HEENT  anicteric, moist oral mucous membrane  Pulm/lungs  normal breathing effort, lungs are clear to auscultation  CVS/Heart  irregular rhythm, no rub or gallop  Abdomen:   Soft, nontender  Extremities:  No peripheral edema  Neurologic:  Alert, oriented, able to follow commands  Skin:  No acute rashes      Lab results: Basic Metabolic Panel: Recent Labs  Lab 06/25/20 0022 06/28/20 0431 06/29/20 0349  NA 135 125* 124*  K 3.7 3.5 3.9  CL 100 89* 90*  CO2 25 27 26   GLUCOSE 129* 130* 155*  BUN 21 30* 32*  CREATININE 0.91 0.66 0.63  CALCIUM 9.1 8.8* 8.4*    Liver Function Tests: No results for input(s): AST, ALT, ALKPHOS, BILITOT, PROT, ALBUMIN in the last 168 hours. No results for input(s): LIPASE, AMYLASE in the last 168 hours. No results for input(s): AMMONIA in the last 168 hours.  CBC: Recent Labs  Lab 06/25/20 0022 06/28/20 0431 06/29/20 0349  WBC 5.1 11.1* 12.6*  NEUTROABS 3.0  --   --   HGB 12.3 11.8* 11.0*  HCT 37.1 34.6* 31.7*  MCV 88.5 86.1 85.0  PLT 140* 164 177    Cardiac Enzymes: No results for input(s): CKTOTAL, TROPONINI in the last 168 hours.  BNP: Invalid input(s): POCBNP  CBG: Recent Labs  Lab 06/26/20 0815  GLUCAP 144*    Microbiology: Recent Results (from the past 720 hour(s))  Resp Panel by RT-PCR (Flu A&B, Covid)     Status: None   Collection Time: 06/25/20  2:28 AM  Result Value Ref Range Status   SARS Coronavirus 2 by RT PCR NEGATIVE NEGATIVE Final    Comment: (NOTE) SARS-CoV-2 target nucleic acids are NOT DETECTED.  The SARS-CoV-2 RNA is generally detectable in upper respiratory specimens during the acute phase of infection. The lowest concentration of SARS-CoV-2 viral copies this assay can detect is 138 copies/mL. A negative result does not preclude SARS-Cov-2 infection and should not be used as the sole basis for treatment or other patient management decisions. A negative result may occur with  improper  specimen collection/handling, submission of specimen other than nasopharyngeal swab, presence of viral mutation(s) within the areas targeted by this assay, and inadequate number of viral copies(<138 copies/mL). A negative result must be combined with clinical observations, patient history, and epidemiological information. The expected result is Negative.  Fact Sheet for Patients:  06/27/20  Fact Sheet for Healthcare Providers:  BloggerCourse.com  This test is no t yet approved or cleared by the [employer] FDA and  has been authorized for detection and/or diagnosis of SARS-CoV-2 by FDA under an Emergency Use Authorization (EUA). This EUA will remain  in effect (meaning this test can be used) for the duration of the COVID-19 declaration under Section 564(b)(1) of the Act, 21 U.S.C.section 360bbb-3(b)(1), unless the authorization is terminated  or revoked sooner.       Influenza A by PCR NEGATIVE NEGATIVE Final   Influenza B by PCR NEGATIVE NEGATIVE Final    Comment: (NOTE) The Xpert Xpress SARS-CoV-2/FLU/RSV plus assay is intended as an aid in the diagnosis of influenza from Nasopharyngeal swab specimens and should not be used as a sole basis for treatment. Nasal washings and aspirates are unacceptable for Xpert Xpress SARS-CoV-2/FLU/RSV testing.  Fact Sheet for Patients: [place]  Fact Sheet for Healthcare Providers: BloggerCourse.com  This test  is not yet approved or cleared by the [place] and has been authorized for detection and/or diagnosis of SARS-CoV-2 by FDA under an Emergency Use Authorization (EUA). This EUA will remain in effect (meaning this test can be used) for the duration of the COVID-19 declaration under Section 564(b)(1) of the Act, 21 U.S.C. section 360bbb-3(b)(1), unless the authorization is terminated or revoked.  Performed at  [facility], [address] [place], [place] [address]      Coagulation Studies: No results for input(s): LABPROT, INR in the last 72 hours.  Urinalysis: No results for input(s): COLORURINE, LABSPEC, PHURINE, GLUCOSEU, HGBUR, BILIRUBINUR, KETONESUR, PROTEINUR, UROBILINOGEN, NITRITE, LEUKOCYTESUR in the last 72 hours.  Invalid input(s): APPERANCEUR      Imaging: DG HIP OPERATIVE UNILAT W OR W/O PELVIS RIGHT  Result Date: 06/28/2020 CLINICAL DATA:  Right hip replacement FLUOROSCOPY TIME:  3.2 seconds. Images: 2 EXAM: OPERATIVE RIGHT HIP (WITH PELVIS IF PERFORMED) 2 VIEWS TECHNIQUE: Fluoroscopic spot image(s) were submitted for interpretation post-operatively. COMPARISON:  June 24, 2020 FINDINGS: The patient is status post right hip replacement. Hardware is in good position. IMPRESSION: Right hip replacement as above. Electronically Signed   By: Gerome Sam III M.D   On: 06/28/2020 12:45      Assessment & Plan: Pt is a 87 y.o.   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  Patient was treated with lactated Ringer during this admission. At present, oral intake is adequate Patient is able to drink Ensure and eat some solid food as per family We will start her on a very low-dose of loop diuretic Monitor sodium closely during hospitalization.     LOS: 4 Tye Vigo 2/21/202212:16 PM    Note: This note was prepared with Dragon dictation. Any transcription errors are unintentional

## 2020-06-29 NOTE — Progress Notes (Signed)
  Speech Language Pathology Treatment: Dysphagia  Patient Details Name: Kristen Osborn MRN: [id number] DOB: Aug 02, 1926 Today's Date: 06/29/2020 Time: 5625-6389 SLP Time Calculation (min) (ACUTE ONLY): 18 min  Assessment / Plan / Recommendation Clinical Impression  Patient had surgery Sunday, was advanced to regular/thin per MD. Daughter present at bedside, reports pt ate "pretty good," ~50% of lunch. Daughter reports pt holds food/liquid in her mouth briefly before swallowing, but no coughing/choking with meals. Family members have been present to assist with all meals. Pt accepted small bites puree, sips of thin liquid (tea, Ensure) via straw with brief oral holding noted 5-10 seconds intermittently, however with appearance of adequate oral containment and timely pharyngeal swallow initiation once oral transfer occurred. No overt signs of aspiration; vital signs remained stable, voice clear. Timing of oral transfer improves when pt holding cup, drinking via straw. Educated daughter on signs of aspiration, general aspiration precautions such as allowing pt to self- feed as much as possible, upright positioning, feeding only when alert, checking for pocketing/oral clearance prior to giving additional bites/sips. At this time pt is tolerating regular diet and thin liquids, no further skilled needs identified; SLP to s/o.     HPI HPI: Kristen Osborn is a 87 y.o. female with medical history significant for atrial fibrillation, diastolic heart failure, dementia, HTN, PAD pacemaker placement, was brought to the emergency room following an accidental fall, found to have right femoral neck fx.      SLP Plan  Discharge SLP treatment due to (comment) (goals  met)       Recommendations  Diet recommendations: Regular;Thin liquid Liquids provided via: Cup;Straw Medication Administration: Whole meds with liquid (can give in puree if pt has difficulty) Supervision: Staff to assist with self feeding  (family assist) Compensations: Slow rate;Small sips/bites;Minimize environmental distractions Postural Changes and/or Swallow Maneuvers: Seated upright 90 degrees                Oral Care Recommendations: Oral care BID Follow up Recommendations: None SLP Visit Diagnosis: Dysphagia, oropharyngeal phase (R13.12) Plan: Discharge SLP treatment due to (comment) (goals  met)       [place], Kristen Osborn, Kristen Osborn Kristen Osborn 06/29/2020, 1:35 PM

## 2020-06-29 NOTE — Progress Notes (Signed)
  Subjective:  Patient reports pain as mild.    Objective:   VITALS:   Vitals:   06/29/20 0249 06/29/20 0410 06/29/20 0755 06/29/20 1225  BP: (!) 167/91 (!) 166/82 (!) 145/94 (!) 157/78  Pulse: 84 84 100 95  Resp:  17 18 17   Temp:  98.9 F (37.2 C) 100.3 F (37.9 C) 98.7 F (37.1 C)  TempSrc:   Oral Oral  SpO2:  92% 91% 91%  Weight:      Height:        PHYSICAL EXAM:  Neurologically intact ABD soft Neurovascular intact Sensation intact distally Intact pulses distally Dorsiflexion/Plantar flexion intact Incision: dressing C/D/I No cellulitis present Compartment soft  LABS  Results for orders placed or performed during the hospital encounter of 06/24/20 (from the past 24 hour(s))  CBC     Status: Abnormal   Collection Time: 06/29/20  3:49 AM  Result Value Ref Range   WBC 12.6 (H) 4.0 - 10.5 K/uL   RBC 3.73 (L) 3.87 - 5.11 MIL/uL   Hemoglobin 11.0 (L) 12.0 - 15.0 g/dL   HCT 07/01/20 (L) 72.5 - 36.6 %   MCV 85.0 80.0 - 100.0 fL   MCH 29.5 26.0 - 34.0 pg   MCHC 34.7 30.0 - 36.0 g/dL   RDW 44.0 34.7 - 42.5 %   Platelets 177 150 - 400 K/uL   nRBC 0.0 0.0 - 0.2 %  Basic metabolic panel     Status: Abnormal   Collection Time: 06/29/20  3:49 AM  Result Value Ref Range   Sodium 124 (L) 135 - 145 mmol/L   Potassium 3.9 3.5 - 5.1 mmol/L   Chloride 90 (L) 98 - 111 mmol/L   CO2 26 22 - 32 mmol/L   Glucose, Bld 155 (H) 70 - 99 mg/dL   BUN 32 (H) 8 - 23 mg/dL   Creatinine, Ser 07/01/20 0.44 - 1.00 mg/dL   Calcium 8.4 (L) 8.9 - 10.3 mg/dL   GFR, Estimated 3.87 >56 mL/min   Anion gap 8 5 - 15    DG HIP OPERATIVE UNILAT W OR W/O PELVIS RIGHT  Result Date: 06/28/2020 CLINICAL DATA:  Right hip replacement FLUOROSCOPY TIME:  3.2 seconds. Images: 2 EXAM: OPERATIVE RIGHT HIP (WITH PELVIS IF PERFORMED) 2 VIEWS TECHNIQUE: Fluoroscopic spot image(s) were submitted for interpretation post-operatively. COMPARISON:  June 24, 2020 FINDINGS: The patient is status post right hip  replacement. Hardware is in good position. IMPRESSION: Right hip replacement as above. Electronically Signed   By: June 26, 2020 III M.D   On: 06/28/2020 12:45    Assessment/Plan: 1 Day Post-Op   Principal Problem:   Closed displaced fracture of right femoral neck (HCC) Active Problems:   Alzheimer's dementia without behavioral disturbance (HCC)   Atrial fibrillation and flutter (HCC)   Benign essential HTN   PAD (peripheral artery disease) (HCC)   Status post placement of cardiac pacemaker   Chronic anticoagulation   Preoperative clearance   Distal radius fracture, right   Protein-calorie malnutrition, severe   Advance diet Up with therapy  WBAT RLE Remain in splint with constant elevation in RUE HGB steady at 11 Continue pain control Continue home Eliquis Follow up March 7 or 8 for staple removal. Call office for appt [phone] Discharge per hospitalist SNF  [phone] , PA-C 06/29/2020, 2:46 PM

## 2020-06-29 NOTE — Progress Notes (Signed)
PROGRESS NOTE    Kristen Osborn  GGY:[id number] DOB: [birth year] DOA: 06/24/2020 PCP: Danella Penton, MD    Brief Narrative:  Kristen Osborn a 87 y.o.femalewith medical history significant foratrial fibrillation on Eliquis, diastolic heart failure, dementia, HTN, PAD pacemaker placement, was brought to the emergency room following an accidental fall.:X-ray right hip: Displaced right femoral neck fracture.  Patient is confused as she has dementia.  Laying in bed.  No complaints  2/18-indwelling Foley was attempted earlier this morning and catheter went into urethra and then during advancement it emerged out of vagina and there was concern for fistula.  Foley in place now.  After manipulation 1100 cc was drained.  Per daughter at bedside patient had issues with Foley placement in the past.  2/19-daughter at bedside. No overnight issues. Plan for surgery tomorrow. 2/20-pt went to surgery today with dr. Odis Luster.  2/21-No BM last 2 days. No issues this am. Na down more to 124  Consultants:   Orthopedics  Procedures:   Antimicrobials:       Subjective: Pt denies pain.  Did not eat much this AM.  Complaint  Objective: Vitals:   06/28/20 2358 06/29/20 0249 06/29/20 0410 06/29/20 0755  BP: (!) 159/87 (!) 167/91 (!) 166/82 (!) 145/94  Pulse: 96 84 84 100  Resp: 20  17 18   Temp: (!) 97.1 F (36.2 C)  98.9 F (37.2 C) 100.3 F (37.9 C)  TempSrc:    Oral  SpO2: 91%  92% 91%  Weight:      Height:        Intake/Output Summary (Last 24 hours) at 06/29/2020 07/01/2020 Last data filed at 06/29/2020 0557 Gross per 24 hour  Intake 1842.03 ml  Output 650 ml  Net 1192.03 ml   Filed Weights   06/24/20 2319 06/25/20 0411  Weight: 45.4 kg 44.6 kg    Examination: Calm, NAD CTA no wheeze rales rhonchi's Regular S1-S2 no gallops Soft benign positive bowel sounds No edema Confused at baseline Mood and affect appropriate in current setting   Data Reviewed: I have personally  reviewed following labs and imaging studies  CBC: Recent Labs  Lab 06/25/20 0022 06/28/20 0431 06/29/20 0349  WBC 5.1 11.1* 12.6*  NEUTROABS 3.0  --   --   HGB 12.3 11.8* 11.0*  HCT 37.1 34.6* 31.7*  MCV 88.5 86.1 85.0  PLT 140* 164 177   Basic Metabolic Panel: Recent Labs  Lab 06/25/20 0022 06/28/20 0431 06/29/20 0349  NA 135 125* 124*  K 3.7 3.5 3.9  CL 100 89* 90*  CO2 25 27 26   GLUCOSE 129* 130* 155*  BUN 21 30* 32*  CREATININE 0.91 0.66 0.63  CALCIUM 9.1 8.8* 8.4*   GFR: Estimated Creatinine Clearance: 32.2 mL/min (by C-G formula based on SCr of 0.63 mg/dL). Liver Function Tests: No results for input(s): AST, ALT, ALKPHOS, BILITOT, PROT, ALBUMIN in the last 168 hours. No results for input(s): LIPASE, AMYLASE in the last 168 hours. No results for input(s): AMMONIA in the last 168 hours. Coagulation Profile: Recent Labs  Lab 06/25/20 0022  INR 1.2   Cardiac Enzymes: No results for input(s): CKTOTAL, CKMB, CKMBINDEX, TROPONINI in the last 168 hours. BNP (last 3 results) No results for input(s): PROBNP in the last 8760 hours. HbA1C: No results for input(s): HGBA1C in the last 72 hours. CBG: Recent Labs  Lab 06/26/20 0815  GLUCAP 144*   Lipid Profile: No results for input(s): CHOL, HDL, LDLCALC, TRIG, CHOLHDL, LDLDIRECT in  the last 72 hours. Thyroid Function Tests: No results for input(s): TSH, T4TOTAL, FREET4, T3FREE, THYROIDAB in the last 72 hours. Anemia Panel: No results for input(s): VITAMINB12, FOLATE, FERRITIN, TIBC, IRON, RETICCTPCT in the last 72 hours. Sepsis Labs: No results for input(s): PROCALCITON, LATICACIDVEN in the last 168 hours.  Recent Results (from the past 240 hour(s))  Resp Panel by RT-PCR (Flu A&B, Covid)     Status: None   Collection Time: 06/25/20  2:28 AM  Result Value Ref Range Status   SARS Coronavirus 2 by RT PCR NEGATIVE NEGATIVE Final    Comment: (NOTE) SARS-CoV-2 target nucleic acids are NOT DETECTED.  The  SARS-CoV-2 RNA is generally detectable in upper respiratory specimens during the acute phase of infection. The lowest concentration of SARS-CoV-2 viral copies this assay can detect is 138 copies/mL. A negative result does not preclude SARS-Cov-2 infection and should not be used as the sole basis for treatment or other patient management decisions. A negative result may occur with  improper specimen collection/handling, submission of specimen other than nasopharyngeal swab, presence of viral mutation(s) within the areas targeted by this assay, and inadequate number of viral copies(<138 copies/mL). A negative result must be combined with clinical observations, patient history, and epidemiological information. The expected result is Negative.  Fact Sheet for Patients:  BloggerCourse.com  Fact Sheet for Healthcare Providers:  SeriousBroker.it  This test is no t yet approved or cleared by the [place] FDA and  has been authorized for detection and/or diagnosis of SARS-CoV-2 by FDA under an Emergency Use Authorization (EUA). This EUA will remain  in effect (meaning this test can be used) for the duration of the COVID-19 declaration under Section 564(b)(1) of the Act, 21 U.S.C.section 360bbb-3(b)(1), unless the authorization is terminated  or revoked sooner.       Influenza A by PCR NEGATIVE NEGATIVE Final   Influenza B by PCR NEGATIVE NEGATIVE Final    Comment: (NOTE) The Xpert Xpress SARS-CoV-2/FLU/RSV plus assay is intended as an aid in the diagnosis of influenza from Nasopharyngeal swab specimens and should not be used as a sole basis for treatment. Nasal washings and aspirates are unacceptable for Xpert Xpress SARS-CoV-2/FLU/RSV testing.  Fact Sheet for Patients: BloggerCourse.com  Fact Sheet for Healthcare Providers: SeriousBroker.it  This test is not yet approved or  cleared by the [place] FDA and has been authorized for detection and/or diagnosis of SARS-CoV-2 by FDA under an Emergency Use Authorization (EUA). This EUA will remain in effect (meaning this test can be used) for the duration of the COVID-19 declaration under Section 564(b)(1) of the Act, 21 U.S.C. section 360bbb-3(b)(1), unless the authorization is terminated or revoked.  Performed at [facility], [address] [place], [place] [address]          Radiology Studies: DG HIP OPERATIVE Lucienne Capers OR W/O PELVIS RIGHT  Result Date: 06/28/2020 CLINICAL DATA:  Right hip replacement FLUOROSCOPY TIME:  3.2 seconds. Images: 2 EXAM: OPERATIVE RIGHT HIP (WITH PELVIS IF PERFORMED) 2 VIEWS TECHNIQUE: Fluoroscopic spot image(s) were submitted for interpretation post-operatively. COMPARISON:  June 24, 2020 FINDINGS: The patient is status post right hip replacement. Hardware is in good position. IMPRESSION: Right hip replacement as above. Electronically Signed   By: Gerome Sam III M.D   On: 06/28/2020 12:45        Scheduled Meds: . Chlorhexidine Gluconate Cloth  6 each Topical Daily  . docusate sodium  100 mg Oral BID  . feeding supplement  237  mL Oral BID BM  . lidocaine  1 patch Transdermal Q24H  . losartan  25 mg Oral Daily  . metoprolol tartrate  25 mg Oral Q6H  . multivitamin with minerals  1 tablet Oral Daily  . pantoprazole  40 mg Oral Daily  . QUEtiapine  25 mg Oral Daily   Continuous Infusions: .  ceFAZolin (ANCEF) IV      Assessment & Plan:   Principal Problem:   Closed displaced fracture of right femoral neck (HCC) Active Problems:   Alzheimer's dementia without behavioral disturbance (HCC)   Atrial fibrillation and flutter (HCC)   Benign essential HTN   PAD (peripheral artery disease) (HCC)   Status post placement of cardiac pacemaker   Chronic anticoagulation   Preoperative clearance   Distal radius fracture, right   Protein-calorie  malnutrition, severe   87 year old female with history of atrial fibrillation on Eliquis, diastolic heart failure, dementia, HTN, PAD pacemaker placement, presenting following a witnessed accidental fall sustaining right hip fracture and right distal radius fracture.     Closed displaced fracture of right femoral neck (HCC) Rt distal radius fx   Witnessed accidental fall in patient on chronic anticoagulation   Preoperative clearance -Accidental fall witnessed by daughter and caregiver -Patient low to moderate risk of perioperative cardiopulmonary events Apply ice and elevate the hand  Pain control   lidocaine patch Continue to hold Eliquis  Was placed in sugar tong splint in ER 2/20--patient is status right hip hemiarthroplasty and reduction of her right wrist today by Dr. 2/21-PT OT Pain control Placed on standing bowel regimen since has not had bowel movement in couple of days Foley to remain for now until more ambulatory and stable         Atrial fibrillation and flutter (with RVR in ER, resolved prior to admission)   Chronic anticoagulation -Patient was found to be in rapid A. 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 2/19-Urology Dr. Delana MeyerBrandon's outpatient input was appreciated -Recommended keeping Foley in for at least a week with consideration of voiding trial at that point in time.  Patient will need to be more ambulatory as the probability of her voiding spontaneously is fairly low at this current time in the setting of medical illnesses, immobility and pharmacologic therapy Per urology if there was a  fistula patient is not a surgical candidate for fistula repair and she currently has a Foley catheter in place which is draining well and functioning  In addition to the above, it sounds based on her history, that she may have some urethral atrophy versus kinking versus stenosis or perhaps that urethra is somewhat ectopic as a result in that the catheter may have just simply coiled in the vagina rather than the possibility of a vesicovaginal fistula.  Either way, we will not plan for any further work-up of this at this time.  No need for further imaging. 2/21 continue Foley until patient more out of acute phase of surgery and able to ambulate and have out bowel movement then will try voiding trial   Hyponatremia-sodium continues to drop today at 124 Etiology  unclear, prerenal v.s. SIADH or combo We will consult nephrology    Alzheimer's dementia without behavioral disturbance (HCC) -Haldol as needed behavioral disturbance continue quetiapine    Essential HTN Elevated at times Continue pain control Continue losartan Continue beta-blockers Continue IV as needed meds     Status post placement of cardiac pacemaker -No acute disease suspected      DVT prophylaxis: scd Code Status: Full Family Communication: Daughters at bedside  Status is: Inpatient  Remains inpatient appropriate because:Inpatient level of care appropriate due to severity of illness   Dispo: The patient is from: Home              Anticipated d/c is to: TBD              Anticipated d/c date is: 3 days              Patient currently is not medically stable to d/c.  Needs PT evaluation.  Sodium level keeps dropping   difficult to place patient No            LOS: 4 days   Time spent: 35 minutes with more than 50% on COC    Lynn Ito, MD [facility] Pager [phone]  If 7PM-7AM, please contact night-coverage 06/29/2020, 8:22 AM

## 2020-06-29 NOTE — Evaluation (Signed)
Occupational Therapy Evaluation Patient Details Name: Kristen Osborn MRN: [id number] DOB: 07/31/1926 Today's Date: 06/29/2020    History of Present Illness Pt is a 87 y.o. female presenting to hospital 2/16 s/p fall at home c/o R hip and R hand pain.  Pt admitted with closed displaced fx of R femoral neck, distal radius fx R, a-fib and a-flutter with RVR in ER; pt also noted with urinary retention.  Pt s/p 2/16 R hip anterior hip hemiarthroplasty and closed reduction and splinting of R distal radius.  PMH includes dementia, a-fib on Eliquis, CHF s/p pacemaker, PAD.   Clinical Impression    Kristen Osborn was seen for OT evaluation this date. Prior to hospital admission, pt was Independent for mobility and ADLs, requiring cues from daughters 2/2 dementia. Pt lives c 3 daughters who provide 24/7 assist. Pt presents to acute OT demonstrating impaired ADL performance and functional mobility 2/2 decreased safety awareness, functional strength/ROM/balance deficits, and poor command following. Pt oriented to self only, daughter at bedside t/o session.  Pt currently requires MAX A don/doff B socks at bed level. MAX A sup<>sit, improves to CGA static sitting balance, MOD A don/doff gown seated EOB - assist for RUE and sequencing. MOD A sit<>stand, tolerates <1 min. Daughter educated on [employer], positioning for edema mgmt, and d/c recommendations. Pt would benefit from skilled OT to address noted impairments and functional limitations (see below for any additional details) in order to maximize safety and independence while minimizing falls risk and caregiver burden. Upon hospital discharge, recommend STR to maximize pt safety and return to PLOF.     Follow Up Recommendations  SNF    Equipment Recommendations  None recommended by OT    Recommendations for Other Services       Precautions / Restrictions Precautions Precautions: Fall;Anterior Hip Precaution Booklet Issued: Yes  (comment) Restrictions Weight Bearing Restrictions: Yes RUE Weight Bearing: Non weight bearing RLE Weight Bearing: Weight bearing as tolerated      Mobility Bed Mobility Overal bed mobility: Needs Assistance Bed Mobility: Supine to Sit;Sit to Supine     Supine to sit: Max assist;HOB elevated Sit to supine: Max assist;HOB elevated   General bed mobility comments: assist for trunk and B LE's; vc's to assist    Transfers Overall transfer level: Needs assistance   Transfers: Sit to/from Stand Sit to Stand: Mod assist         General transfer comment: achieves upright posture, unable to shift weight, tolerates <1 min standing    Balance Overall balance assessment: Needs assistance Sitting-balance support: Single extremity supported;Feet supported Sitting balance-Leahy Scale: Fair Sitting balance - Comments: CGA Postural control: Posterior lean Standing balance support: Single extremity supported Standing balance-Leahy Scale: Poor                             ADL either performed or assessed with clinical judgement   ADL Overall ADL's : Needs assistance/impaired                                       General ADL Comments: MAX A don/doff B socks at bed level. MOD A don/doff gown seated EOB - assist for RUE and sequencing.                  Pertinent Vitals/Pain Pain Assessment: Faces Faces Pain Scale: Hurts a little  bit Pain Location: R hip/thigh Pain Descriptors / Indicators: Grimacing;Operative site guarding Pain Intervention(s): Limited activity within patient's tolerance;Repositioned     Hand Dominance Right   Extremity/Trunk Assessment Upper Extremity Assessment Upper Extremity Assessment: RUE deficits/detail RUE Deficits / Details: in splint with ace wrap bandage; fingers appearing swollen and thumb bruised RUE: Unable to fully assess due to immobilization LUE Deficits / Details: generalized weakness   Lower Extremity  Assessment Lower Extremity Assessment: Difficult to assess due to impaired cognition   Cervical / Trunk Assessment Cervical / Trunk Assessment: Normal   Communication Communication Communication: No difficulties   Cognition Arousal/Alertness: Awake/alert Behavior During Therapy: WFL for tasks assessed/performed Overall Cognitive Status: History of cognitive impairments - at baseline                                 General Comments: A&Ox1 - self only (intermittenly able to recognize daughter at bedside)   General Comments       Exercises Exercises: Other exercises Other Exercises Other Exercises: Pt and family educated re: OT role, DME recs, d/c recs, falls prevention, edema mgmt, WBing pcns Other Exercises: LBD, UBD, sup<>sit, sit<>stand, sitting/standing blaance/tolerance   Shoulder Instructions      Home Living Family/patient expects to be discharged to:: Private residence Living Arrangements: Alone Available Help at Discharge: Family;Available 24 hours/day Type of Home: House Home Access: Stairs to enter [employer] of Steps: 2 steps with R railing into home (then has 3 steps with R railing to kitchen but can stay on one level) Entrance Stairs-Rails: Right Home Layout: One level     Bathroom Shower/Tub: Producer, television/film/video: Standard     Home Equipment: Environmental consultant - 2 wheels;Walker - standard;Bedside commode;Shower seat - built in;Grab bars - tub/shower          Prior Functioning/Environment Level of Independence: Needs assistance  Gait / Transfers Assistance Needed: Independent with ambulation; no other recent falls     Comments: 3 daughters take turns providing 24/7 assist        OT Problem List: Decreased strength;Decreased range of motion;Impaired balance (sitting and/or standing);Decreased activity tolerance;Decreased safety awareness;Impaired UE functional use      OT Treatment/Interventions: Self-care/ADL  training;Therapeutic exercise;Energy conservation;DME and/or AE instruction;Therapeutic activities;Patient/family education;Balance training    OT Goals(Current goals can be found in the care plan section) Acute Rehab OT Goals Patient Stated Goal: to improve mobility OT Goal Formulation: With patient/family Time For Goal Achievement: 07/13/20 Potential to Achieve Goals: Fair ADL Goals Pt Will Perform Eating: with modified independence;bed level Pt Will Perform Upper Body Dressing: sitting;with caregiver independent in assisting;with set-up;with supervision Pt Will Transfer to Toilet: with min assist;stand pivot transfer;bedside commode (c LRAD PRN)  OT Frequency: Min 2X/week    AM-PAC OT "6 Clicks" Daily Activity     Outcome Measure Help from another person eating meals?: A Little Help from another person taking care of personal grooming?: A Little Help from another person toileting, which includes using toliet, bedpan, or urinal?: A Lot Help from another person bathing (including washing, rinsing, drying)?: A Lot Help from another person to put on and taking off regular upper body clothing?: A Lot Help from another person to put on and taking off regular lower body clothing?: A Lot 6 Click Score: 14   End of Session    Activity Tolerance: Patient tolerated treatment well Patient left: in bed;with call bell/phone within reach;with bed  alarm set;with family/visitor present  OT Visit Diagnosis: Unsteadiness on feet (R26.81);Muscle weakness (generalized) (M62.81)                Time: 6834-1962 OT Time Calculation (min): 31 min Charges:  OT General Charges $OT Visit: 1 Visit OT Evaluation $OT Eval Moderate Complexity: 1 Mod OT Treatments $Self Care/Home Management : 23-37 mins  Kathie Dike, M.S. OTR/L  06/29/20, 4:31 PM  ascom [phone]

## 2020-06-30 ENCOUNTER — Encounter: Payer: Self-pay | Admitting: Orthopedic Surgery

## 2020-06-30 DIAGNOSIS — S72001A Fracture of unspecified part of neck of right femur, initial encounter for closed fracture: Secondary | ICD-10-CM | POA: Diagnosis not present

## 2020-06-30 LAB — BASIC METABOLIC PANEL
Anion gap: 6 (ref 5–15)
BUN: 35 mg/dL — ABNORMAL HIGH (ref 8–23)
CO2: 29 mmol/L (ref 22–32)
Calcium: 8.5 mg/dL — ABNORMAL LOW (ref 8.9–10.3)
Chloride: 90 mmol/L — ABNORMAL LOW (ref 98–111)
Creatinine, Ser: 0.81 mg/dL (ref 0.44–1.00)
GFR, Estimated: >60 mL/min (ref >60)
Glucose, Bld: 199 mg/dL — ABNORMAL HIGH (ref 70–99)
Potassium: 3.8 mmol/L (ref 3.5–5.1)
Sodium: 125 mmol/L — ABNORMAL LOW (ref 135–145)

## 2020-06-30 LAB — CBC
HCT: 30.1 % — ABNORMAL LOW (ref 36.0–46.0)
Hemoglobin: 10.3 g/dL — ABNORMAL LOW (ref 12.0–15.0)
MCH: 29.9 pg (ref 26.0–34.0)
MCHC: 34.2 g/dL (ref 30.0–36.0)
MCV: 87.2 fL (ref 80.0–100.0)
Platelets: 186 10*3/uL (ref 150–400)
RBC: 3.45 MIL/uL — ABNORMAL LOW (ref 3.87–5.11)
RDW: 13.8 % (ref 11.5–15.5)
WBC: 13.1 10*3/uL — ABNORMAL HIGH (ref 4.0–10.5)
nRBC: 0 % (ref 0.0–0.2)

## 2020-06-30 LAB — SODIUM: Sodium: 123 mmol/L — ABNORMAL LOW (ref 135–145)

## 2020-06-30 LAB — GLUCOSE, CAPILLARY: Glucose-Capillary: 152 mg/dL — ABNORMAL HIGH (ref 70–99)

## 2020-06-30 LAB — SURGICAL PATHOLOGY

## 2020-06-30 MED ORDER — SODIUM CHLORIDE 3 % IV SOLN
INTRAVENOUS | Status: DC
  Filled 2020-06-30 (×3): qty 500

## 2020-06-30 MED ORDER — APIXABAN 2.5 MG PO TABS
2.5000 mg | ORAL_TABLET | Freq: Two times a day (BID) | ORAL | Status: DC
  Administered 2020-06-30 – 2020-07-06 (×13): 2.5 mg via ORAL
  Filled 2020-06-30 (×13): qty 1

## 2020-06-30 MED ORDER — FLEET ENEMA 7-19 GM/118ML RE ENEM
1.0000 | ENEMA | Freq: Once | RECTAL | Status: AC
  Administered 2020-06-30: 1 via RECTAL

## 2020-06-30 MED ORDER — BISACODYL 5 MG PO TBEC
10.0000 mg | DELAYED_RELEASE_TABLET | Freq: Every day | ORAL | Status: DC
  Administered 2020-06-30 – 2020-07-01 (×2): 10 mg via ORAL
  Filled 2020-06-30 (×4): qty 2

## 2020-06-30 MED ORDER — POLYETHYLENE GLYCOL 3350 17 G PO PACK
17.0000 g | PACK | Freq: Two times a day (BID) | ORAL | Status: DC
  Administered 2020-07-02: 17 g via ORAL
  Filled 2020-06-30 (×5): qty 1

## 2020-06-30 MED ORDER — QUETIAPINE FUMARATE 25 MG PO TABS
25.0000 mg | ORAL_TABLET | Freq: Every day | ORAL | Status: DC
  Administered 2020-06-30 – 2020-07-05 (×6): 25 mg via ORAL
  Filled 2020-06-30 (×6): qty 1

## 2020-06-30 NOTE — Progress Notes (Signed)
MEDICATION RELATED CONSULT NOTE - INITIAL   Pharmacy Consult for Hypertonic saline/Sodium monitoring Indication: hyponatremia  Allergies  Allergen Reactions  . Lisinopril Other (See Comments)    Patient Measurements: Height: 5\' 6"  (167.6 cm) Weight: 44.6 kg (98 lb 5.2 oz) IBW/kg (Calculated) : 59.3 Adjusted Body Weight: ** *  Vital Signs: Temp: 99.1 F (37.3 C) (02/22 1656) Temp Source: Oral (02/22 1656) BP: 121/73 (02/22 1656) Pulse Rate: 78 (02/22 1656) Intake/Output from previous day: 02/21 0701 - 02/22 0700 In: 240 [P.O.:240] Out: 600 [Urine:600] Intake/Output from this shift: Total I/O In: 240 [P.O.:240] Out: 600 [Urine:600]  Labs: Recent Labs    06/28/20 0431 06/29/20 0349 06/30/20 0449  WBC 11.1* 12.6* 13.1*  HGB 11.8* 11.0* 10.3*  HCT 34.6* 31.7* 30.1*  PLT 164 177 186  CREATININE 0.66 0.63 0.81   Estimated Creatinine Clearance: 31.9 mL/min (by C-G formula based on SCr of 0.81 mg/dL).   Microbiology: Recent Results (from the past 720 hour(s))  Resp Panel by RT-PCR (Flu A&B, Covid)     Status: None   Collection Time: 06/25/20  2:28 AM  Result Value Ref Range Status   SARS Coronavirus 2 by RT PCR NEGATIVE NEGATIVE Final    Comment: (NOTE) SARS-CoV-2 target nucleic acids are NOT DETECTED.  The SARS-CoV-2 RNA is generally detectable in upper respiratory specimens during the acute phase of infection. The lowest concentration of SARS-CoV-2 viral copies this assay can detect is 138 copies/mL. A negative result does not preclude SARS-Cov-2 infection and should not be used as the sole basis for treatment or other patient management decisions. A negative result may occur with  improper specimen collection/handling, submission of specimen other than nasopharyngeal swab, presence of viral mutation(s) within the areas targeted by this assay, and inadequate number of viral copies(<138 copies/mL). A negative result must be combined with clinical observations,  patient history, and epidemiological information. The expected result is Negative.  Fact Sheet for Patients:  06/27/20  Fact Sheet for Healthcare Providers:  BloggerCourse.com  This test is no t yet approved or cleared by the [place] FDA and  has been authorized for detection and/or diagnosis of SARS-CoV-2 by FDA under an Emergency Use Authorization (EUA). This EUA will remain  in effect (meaning this test can be used) for the duration of the COVID-19 declaration under Section 564(b)(1) of the Act, 21 U.S.C.section 360bbb-3(b)(1), unless the authorization is terminated  or revoked sooner.       Influenza A by PCR NEGATIVE NEGATIVE Final   Influenza B by PCR NEGATIVE NEGATIVE Final    Comment: (NOTE) The Xpert Xpress SARS-CoV-2/FLU/RSV plus assay is intended as an aid in the diagnosis of influenza from Nasopharyngeal swab specimens and should not be used as a sole basis for treatment. Nasal washings and aspirates are unacceptable for Xpert Xpress SARS-CoV-2/FLU/RSV testing.  Fact Sheet for Patients: [place]  Fact Sheet for Healthcare Providers: BloggerCourse.com  This test is not yet approved or cleared by the [place] FDA and has been authorized for detection and/or diagnosis of SARS-CoV-2 by FDA under an Emergency Use Authorization (EUA). This EUA will remain in effect (meaning this test can be used) for the duration of the COVID-19 declaration under Section 564(b)(1) of the Act, 21 U.S.C. section 360bbb-3(b)(1), unless the authorization is terminated or revoked.  Performed at [facility], [address] [place], [place]     Medical History: Past Medical History:  Diagnosis Date  . Atrial fibrillation (HCC)   . CHF (  congestive heart failure) (HCC)   . Dementia (HCC)     Medications:  Scheduled:  . apixaban  2.5 mg  Oral BID  . bisacodyl  10 mg Oral Daily  . Chlorhexidine Gluconate Cloth  6 each Topical Daily  . docusate sodium  100 mg Oral BID  . feeding supplement  237 mL Oral BID BM  . lidocaine  1 patch Transdermal Q24H  . losartan  25 mg Oral Daily  . metoprolol tartrate  25 mg Oral Q6H  . multivitamin with minerals  1 tablet Oral Daily  . pantoprazole  40 mg Oral Daily  . polyethylene glycol  17 g Oral BID  . QUEtiapine  25 mg Oral Daily  . senna-docusate  2 tablet Oral BID  . sodium phosphate  1 enema Rectal Once   Infusions:  .  ceFAZolin (ANCEF) IV    . sodium chloride (hypertonic)      Assessment: 87 yo F to start Hypertonic 3% saline drip for hyponatremia likely due to postop SIADH.  Furosemide d/ced Nephrology following Na q4h  2/22 1609 Na 123   Goal of Therapy:  Na WNL  Plan:  MD has ordered 3% saline drip to start at 30 ml/hr x 48 hrs and Sodium level q4h.   If sodium rises > 4 mEq/L over 2 hours or > 6 mEq/L over 4 hours - Pharmacy will evaluate and contact MD per consult.    Nameer Summer A 06/30/2020,5:55 PM

## 2020-06-30 NOTE — Progress Notes (Signed)
Called and received report for [facility] on 1A. Waiting on room to be cleaned at this time.  Will update RN night shift that patient will come once room is clean.

## 2020-06-30 NOTE — Progress Notes (Signed)
PT Cancellation Note  Patient Details Name: Kristen Osborn MRN: [id number] DOB: 11-Jun-1926   Cancelled Treatment:    Reason Eval/Treat Not Completed: Other (comment).  PT brought R platform RW to trial with pt (pt's R UE WB'ing orders upgraded to WBAT through elbow only R UE d/t wrist fx).  Pt resting in bed upon PT arrival (eyes closed but opens with vc's) with pt's daughter present.  Therapist and pt's daughter attempted to have pt participate in therapy but pt refusing and resisted any movement to attempt sitting edge of bed.  Pt's daughter reports pt sundown's around 3-4 pm.  Will re-attempt PT session tomorrow earlier in the day.  Hendricks Limes, PT 06/30/20, 3:04 PM

## 2020-06-30 NOTE — Progress Notes (Signed)
[facility] [place], [place] 06/30/20  Subjective:   LOS: 5  Kristen Osborn is a 87 y.o. female with past medical history of Atrial Fibrillation, CHF and Dementia. She presents to the ED after a fall. She has underwent a right hip anterior hemiarthroplasty. We were consulted for hyponatremia.  Patient is seen today resting comfortable in bed. Alert and oriented, confused at times Able to eat most of breakfast No nausea and vomiting Denies shortness of breath and chest pain  UOP-658ml  Objective:  Vital signs in last 24 hours:  Temp:  [97.8 F (36.6 C)-99.8 F (37.7 C)] 97.8 F (36.6 C) (02/22 1111) Pulse Rate:  [79-110] 81 (02/22 1111) Resp:  [15-20] 15 (02/22 1111) BP: (144-160)/(76-98) 148/77 (02/22 1111) SpO2:  [92 %-95 %] 95 % (02/22 1111)  Weight change:  Filed Weights   06/24/20 2319 06/25/20 0411  Weight: 45.4 kg 44.6 kg    Intake/Output:    Intake/Output Summary (Last 24 hours) at 06/30/2020 1225 Last data filed at 06/30/2020 1024 Gross per 24 hour  Intake 480 ml  Output 600 ml  Net -120 ml   Physical Exam: General:  No acute distress, laying in the bed, frail, elderly  HEENT  anicteric, moist oral mucous membrane  Pulm/lungs  normal breathing effort, lungs are clear  CVS/Heart  irregular rhythm, no rub or gallop  Abdomen:   Soft, nontender  Extremities:  No peripheral edema  Neurologic:  Alert, oriented, able to follow commands  Skin:  No acute rashes    Basic Metabolic Panel:  Recent Labs  Lab 06/25/20 0022 06/28/20 0431 06/29/20 0349 06/30/20 0449  NA 135 125* 124* 125*  K 3.7 3.5 3.9 3.8  CL 100 89* 90* 90*  CO2 25 27 26 29   GLUCOSE 129* 130* 155* 199*  BUN 21 30* 32* 35*  CREATININE 0.91 0.66 0.63 0.81  CALCIUM 9.1 8.8* 8.4* 8.5*     CBC: Recent Labs  Lab 06/25/20 0022 06/28/20 0431 06/29/20 0349 06/30/20 0449  WBC 5.1 11.1* 12.6* 13.1*  NEUTROABS 3.0  --   --   --   HGB 12.3 11.8* 11.0* 10.3*  HCT 37.1  34.6* 31.7* 30.1*  MCV 88.5 86.1 85.0 87.2  PLT 140* 164 177 186     No results found for: HEPBSAG, HEPBSAB, HEPBIGM    Microbiology:  Recent Results (from the past 240 hour(s))  Resp Panel by RT-PCR (Flu A&B, Covid)     Status: None   Collection Time: 06/25/20  2:28 AM  Result Value Ref Range Status   SARS Coronavirus 2 by RT PCR NEGATIVE NEGATIVE Final    Comment: (NOTE) SARS-CoV-2 target nucleic acids are NOT DETECTED.  The SARS-CoV-2 RNA is generally detectable in upper respiratory specimens during the acute phase of infection. The lowest concentration of SARS-CoV-2 viral copies this assay can detect is 138 copies/mL. A negative result does not preclude SARS-Cov-2 infection and should not be used as the sole basis for treatment or other patient management decisions. A negative result may occur with  improper specimen collection/handling, submission of specimen other than nasopharyngeal swab, presence of viral mutation(s) within the areas targeted by this assay, and inadequate number of viral copies(<138 copies/mL). A negative result must be combined with clinical observations, patient history, and epidemiological information. The expected result is Negative.  Fact Sheet for Patients:  06/27/20  Fact Sheet for Healthcare Providers:  BloggerCourse.com  This test is no t yet approved or cleared by the [employer]  FDA and  has been authorized for detection and/or diagnosis of SARS-CoV-2 by FDA under an Emergency Use Authorization (EUA). This EUA will remain  in effect (meaning this test can be used) for the duration of the COVID-19 declaration under Section 564(b)(1) of the Act, 21 U.S.C.section 360bbb-3(b)(1), unless the authorization is terminated  or revoked sooner.       Influenza A by PCR NEGATIVE NEGATIVE Final   Influenza B by PCR NEGATIVE NEGATIVE Final    Comment: (NOTE) The Xpert Xpress  SARS-CoV-2/FLU/RSV plus assay is intended as an aid in the diagnosis of influenza from Nasopharyngeal swab specimens and should not be used as a sole basis for treatment. Nasal washings and aspirates are unacceptable for Xpert Xpress SARS-CoV-2/FLU/RSV testing.  Fact Sheet for Patients: BloggerCourse.com  Fact Sheet for Healthcare Providers: SeriousBroker.it  This test is not yet approved or cleared by the [place] FDA and has been authorized for detection and/or diagnosis of SARS-CoV-2 by FDA under an Emergency Use Authorization (EUA). This EUA will remain in effect (meaning this test can be used) for the duration of the COVID-19 declaration under Section 564(b)(1) of the Act, 21 U.S.C. section 360bbb-3(b)(1), unless the authorization is terminated or revoked.  Performed at [facility], [address] [place], [place] [address]     Coagulation Studies: No results for input(s): LABPROT, INR in the last 72 hours.  Urinalysis: No results for input(s): COLORURINE, LABSPEC, PHURINE, GLUCOSEU, HGBUR, BILIRUBINUR, KETONESUR, PROTEINUR, UROBILINOGEN, NITRITE, LEUKOCYTESUR in the last 72 hours.  Invalid input(s): APPERANCEUR    Imaging: No results found.   Medications:     ceFAZolin (ANCEF) IV      apixaban  2.5 mg Oral BID   Chlorhexidine Gluconate Cloth  6 each Topical Daily   docusate sodium  100 mg Oral BID   feeding supplement  237 mL Oral BID BM   furosemide  10 mg Oral Daily   lidocaine  1 patch Transdermal Q24H   losartan  25 mg Oral Daily   metoprolol tartrate  25 mg Oral Q6H   multivitamin with minerals  1 tablet Oral Daily   pantoprazole  40 mg Oral Daily   polyethylene glycol  17 g Oral Daily   QUEtiapine  25 mg Oral Daily   senna-docusate  2 tablet Oral BID   bisacodyl, hydrALAZINE, HYDROcodone-acetaminophen, metoCLOPramide **OR** metoCLOPramide (REGLAN) injection, morphine  injection, ondansetron **OR** ondansetron (ZOFRAN) IV  Assessment/ Plan:  87 y.o. female with  was admitted on 06/24/2020 for  Principal Problem:   Closed displaced fracture of right femoral neck (HCC) Active Problems:   Alzheimer's dementia without behavioral disturbance (HCC)   Atrial fibrillation and flutter (HCC)   Benign essential HTN   PAD (peripheral artery disease) (HCC)   Status post placement of cardiac pacemaker   Chronic anticoagulation   Preoperative clearance   Distal radius fracture, right   Protein-calorie malnutrition, severe  Closed right hip fracture (HCC) [S72.001A] Fall, initial encounter [W19.XXXA] Closed displaced fracture of right femoral neck (HCC) [S72.001A] Closed fracture of distal end of right radius, unspecified fracture morphology, initial encounter [S52.501A]  #. Hyponatremia  -likely due to postop SIADH -Baseline Sodium- 135 (Jun 25, 2020) -Increased to 125 today -Should continue to improve with improved oral intake -Recheck sodium level this afternoon -Continue low dose loop diuretic    LOS: 5 [employer] 2/22/202212:25 PM  [facility] [place], [place] [phone]

## 2020-06-30 NOTE — Progress Notes (Signed)
PROGRESS NOTE    Kristen MaclachlanVirginia Osborn Osborn  ZOX:[id number] DOB: [birth year] DOA: 06/24/2020 PCP: Danella PentonMiller, Mark F, MD    Brief Narrative:  Kristen Osborn Cookis a 87 y.o.femalewith medical history significant foratrial fibrillation on Eliquis, diastolic heart failure, dementia, HTN, PAD pacemaker placement, was brought to the emergency room following an accidental fall.:X-ray right hip: Displaced right femoral neck fracture.  Patient is confused as she has dementia.  Laying in bed.  No complaints  2/18-indwelling Foley was attempted earlier this morning and catheter went into urethra and then during advancement it emerged out of vagina and there was concern for fistula.  Foley in place now.  After manipulation 1100 cc was drained.  Per daughter at bedside patient had issues with Foley placement in the past.  2/19-daughter at bedside. No overnight issues. Plan for surgery tomorrow. 2/20-pt went to surgery today with dr. Odis LusterBowers.  2/21-No BM last 2 days. No issues this am. Na down more to 124 2/22-no bm still. More awake and interactive today.   Consultants:   Orthopedics, nephrology  Procedures:   Antimicrobials:       Subjective: Pt has no pain. No bm. No sob. Foley in place.   Objective: Vitals:   06/30/20 0013 06/30/20 0322 06/30/20 0830 06/30/20 1111  BP: (!) 144/86 (!) 160/98 (!) 156/92 (!) 148/77  Pulse: 94 (!) 110 97 81  Resp: 17 18 16 15   Temp: 99.8 F (37.7 C) 98.2 F (36.8 C) 98.4 F (36.9 C) 97.8 F (36.6 C)  TempSrc: Oral Oral Oral Oral  SpO2: 92% 93% 95% 95%  Weight:      Height:        Intake/Output Summary (Last 24 hours) at 06/30/2020 1428 Last data filed at 06/30/2020 1024 Gross per 24 hour  Intake 240 ml  Output 600 ml  Net -360 ml   Filed Weights   06/24/20 2319 06/25/20 0411  Weight: 45.4 kg 44.6 kg    Examination: Calm, nad cta no Osborn/r/r Regular, S1-S2, no gallops Soft nontender nondistended positive bowel sounds No edema Awake, alert not  oriented except to person which is her baseline Mood and affect appropriate in current setting   Data Reviewed: I have personally reviewed following labs and imaging studies  CBC: Recent Labs  Lab 06/25/20 0022 06/28/20 0431 06/29/20 0349 06/30/20 0449  WBC 5.1 11.1* 12.6* 13.1*  NEUTROABS 3.0  --   --   --   HGB 12.3 11.8* 11.0* 10.3*  HCT 37.1 34.6* 31.7* 30.1*  MCV 88.5 86.1 85.0 87.2  PLT 140* 164 177 186   Basic Metabolic Panel: Recent Labs  Lab 06/25/20 0022 06/28/20 0431 06/29/20 0349 06/30/20 0449  NA 135 125* 124* 125*  K 3.7 3.5 3.9 3.8  CL 100 89* 90* 90*  CO2 25 27 26 29   GLUCOSE 129* 130* 155* 199*  BUN 21 30* 32* 35*  CREATININE 0.91 0.66 0.63 0.81  CALCIUM 9.1 8.8* 8.4* 8.5*   GFR: Estimated Creatinine Clearance: 31.9 mL/min (by C-G formula based on SCr of 0.81 mg/dL). Liver Function Tests: No results for input(s): AST, ALT, ALKPHOS, BILITOT, PROT, ALBUMIN in the last 168 hours. No results for input(s): LIPASE, AMYLASE in the last 168 hours. No results for input(s): AMMONIA in the last 168 hours. Coagulation Profile: Recent Labs  Lab 06/25/20 0022  INR 1.2   Cardiac Enzymes: No results for input(s): CKTOTAL, CKMB, CKMBINDEX, TROPONINI in the last 168 hours. BNP (last 3 results) No results for input(s): PROBNP in  the last 8760 hours. HbA1C: No results for input(s): HGBA1C in the last 72 hours. CBG: Recent Labs  Lab 06/26/20 0815  GLUCAP 144*   Lipid Profile: No results for input(s): CHOL, HDL, LDLCALC, TRIG, CHOLHDL, LDLDIRECT in the last 72 hours. Thyroid Function Tests: No results for input(s): TSH, T4TOTAL, FREET4, T3FREE, THYROIDAB in the last 72 hours. Anemia Panel: No results for input(s): VITAMINB12, FOLATE, FERRITIN, TIBC, IRON, RETICCTPCT in the last 72 hours. Sepsis Labs: No results for input(s): PROCALCITON, LATICACIDVEN in the last 168 hours.  Recent Results (from the past 240 hour(s))  Resp Panel by RT-PCR (Flu A&B,  Covid)     Status: None   Collection Time: 06/25/20  2:28 AM  Result Value Ref Range Status   SARS Coronavirus 2 by RT PCR NEGATIVE NEGATIVE Final    Comment: (NOTE) SARS-CoV-2 target nucleic acids are NOT DETECTED.  The SARS-CoV-2 RNA is generally detectable in upper respiratory specimens during the acute phase of infection. The lowest concentration of SARS-CoV-2 viral copies this assay can detect is 138 copies/mL. A negative result does not preclude SARS-Cov-2 infection and should not be used as the sole basis for treatment or other patient management decisions. A negative result may occur with  improper specimen collection/handling, submission of specimen other than nasopharyngeal swab, presence of viral mutation(s) within the areas targeted by this assay, and inadequate number of viral copies(<138 copies/mL). A negative result must be combined with clinical observations, patient history, and epidemiological information. The expected result is Negative.  Fact Sheet for Patients:  BloggerCourse.com  Fact Sheet for Healthcare Providers:  [employer]iousBroker.it  This test is no t yet approved or cleared by the [place] FDA and  has been authorized for detection and/or diagnosis of SARS-CoV-2 by FDA under an Emergency Use Authorization (EUA). This EUA will remain  in effect (meaning this test can be used) for the duration of the COVID-19 declaration under Section 564(b)(1) of the Act, 21 U.S.C.section 360bbb-3(b)(1), unless the authorization is terminated  or revoked sooner.       Influenza A by PCR NEGATIVE NEGATIVE Final   Influenza B by PCR NEGATIVE NEGATIVE Final    Comment: (NOTE) The Xpert Xpress SARS-CoV-2/FLU/RSV plus assay is intended as an aid in the diagnosis of influenza from Nasopharyngeal swab specimens and should not be used as a sole basis for treatment. Nasal washings and aspirates are unacceptable for Xpert  Xpress SARS-CoV-2/FLU/RSV testing.  Fact Sheet for Patients: BloggerCourse.com  Fact Sheet for Healthcare Providers: SeriousBroker.it  This test is not yet approved or cleared by the [place] FDA and has been authorized for detection and/or diagnosis of SARS-CoV-2 by FDA under an Emergency Use Authorization (EUA). This EUA will remain in effect (meaning this test can be used) for the duration of the COVID-19 declaration under Section 564(b)(1) of the Act, 21 U.S.C. section 360bbb-3(b)(1), unless the authorization is terminated or revoked.  Performed at [facility], [address] [place], [place] [address]          Radiology Studies: No results found.      Scheduled Meds: . apixaban  2.5 mg Oral BID  . Chlorhexidine Gluconate Cloth  6 each Topical Daily  . docusate sodium  100 mg Oral BID  . feeding supplement  237 mL Oral BID BM  . furosemide  10 mg Oral Daily  . lidocaine  1 patch Transdermal Q24H  . losartan  25 mg Oral Daily  . metoprolol tartrate  25 mg Oral  Q6H  . multivitamin with minerals  1 tablet Oral Daily  . pantoprazole  40 mg Oral Daily  . polyethylene glycol  17 g Oral Daily  . QUEtiapine  25 mg Oral Daily  . senna-docusate  2 tablet Oral BID   Continuous Infusions: .  ceFAZolin (ANCEF) IV      Assessment & Plan:   Principal Problem:   Closed displaced fracture of right femoral neck (HCC) Active Problems:   Alzheimer's dementia without behavioral disturbance (HCC)   Atrial fibrillation and flutter (HCC)   Benign essential HTN   PAD (peripheral artery disease) (HCC)   Status post placement of cardiac pacemaker   Chronic anticoagulation   Preoperative clearance   Distal radius fracture, right   Protein-calorie malnutrition, severe   87 year old female with history of atrial fibrillation on Eliquis, diastolic heart failure, dementia, HTN, PAD pacemaker placement,  presenting following a witnessed accidental fall sustaining right hip fracture and right distal radius fracture.     Closed displaced fracture of right femoral neck (HCC) Rt distal radius fx   Witnessed accidental fall in patient on chronic anticoagulation   Preoperative clearance -Accidental fall witnessed by daughter and caregiver -Patient low to moderate risk of perioperative cardiopulmonary events Apply ice and elevate the hand  Pain control   lidocaine patch Continue to hold Eliquis  Was placed in sugar tong splint in ER 2/20--patient is status right hip hemiarthroplasty and reduction of her right wrist today by Dr. Odis Luster 2/22-pain controlled, no BM.  Will add more to her bowel regimen Include fleet enema Increase miralax to bid dosing Foley to remain until has BM and more ambulatory, then will d/c for voiding trial. Follow up March 7 or 8 for staple removal. Call office for appt [phone] Continue home dose Eliquis for dvt ppx        Atrial fibrillation and flutter (with RVR in ER, resolved prior to admission)   Chronic anticoagulation -Patient was found to be in rapid A. 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 2/19-Urology Dr. Delana Meyer outpatient input was appreciated -Recommended keeping Foley in for at least a week with consideration of voiding trial at that point in time.  Patient will need to be more ambulatory as the probability of her voiding spontaneously is fairly low at this current time in the setting of medical illnesses, immobility and pharmacologic therapy Per urology if there was a fistula patient is not a surgical candidate for fistula repair and she currently has a Foley  catheter in place which is draining well and functioning  In addition to the above, it sounds based on her history, that she may have some urethral atrophy versus kinking versus stenosis or perhaps that urethra is somewhat ectopic as a result in that the catheter may have just simply coiled in the vagina rather than the possibility of a vesicovaginal fistula.  Either way, we will not plan for any further work-up of this at this time.  No need for further imaging. 2/22-Foley to remain until patient has a bowel movement and is more ambulatory.  Once has bowel movement can do a voiding trial.    Hyponatremia- Initially partly prerenal now postop SIADH  Baseline sodium around 135  Nephrology was consulted input was appreciated.   Was started on low-dose loop diuretic  sodium increased today to 125  EnCourage p.o. intake  Ck sodium this afternoon.     Alzheimer's dementia without behavioral disturbance (HCC) -Haldol as needed behavioral disturbance continue quetiapine    Essential HTN Elevated at times Continue pain control Continue losartan Continue beta-blockers Continue IV as needed meds     Status post placement of cardiac pacemaker -No acute disease suspected      DVT prophylaxis:eliuis Code Status: Full Family Communication: Daughters at bedside  Status is: Inpatient  Remains inpatient appropriate because:Inpatient level of care appropriate due to severity of illness   Dispo: The patient is from: Home              Anticipated d/c is to: SNF              Anticipated d/c date is: 1-2 days              Patient currently is not medically stable to d/c. Needs snf bed, also Na needs to be closer to 127 for discharge. difficult to place patient No            LOS: 5 days   Time spent: 35 minutes with more than 50% on COC    Lynn Ito, MD [facility] Pager [phone]  If 7PM-7AM, please contact night-coverage 06/30/2020, 2:28 PM

## 2020-06-30 NOTE — Progress Notes (Signed)
  Subjective:  Patient reports pain as mild.    Objective:   VITALS:   Vitals:   06/29/20 1949 06/29/20 2126 06/30/20 0013 06/30/20 0322  BP: (!) 153/89 (!) 145/80 (!) 144/86 (!) 160/98  Pulse: 79 99 94 (!) 110  Resp: 20  17 18   Temp: 98 F (36.7 C) 98.1 F (36.7 C) 99.8 F (37.7 C) 98.2 F (36.8 C)  TempSrc: Oral Oral Oral Oral  SpO2: 94%  92% 93%  Weight:      Height:        PHYSICAL EXAM:  Neurologically intact ABD soft Neurovascular intact Sensation intact distally Intact pulses distally Dorsiflexion/Plantar flexion intact Incision: scant drainage and drsg changed No cellulitis present Compartment soft  LABS  Results for orders placed or performed during the hospital encounter of 06/24/20 (from the past 24 hour(s))  CBC     Status: Abnormal   Collection Time: 06/30/20  4:49 AM  Result Value Ref Range   WBC 13.1 (H) 4.0 - 10.5 K/uL   RBC 3.45 (L) 3.87 - 5.11 MIL/uL   Hemoglobin 10.3 (L) 12.0 - 15.0 g/dL   HCT 07/02/20 (L) 91.6 - 38.4 %   MCV 87.2 80.0 - 100.0 fL   MCH 29.9 26.0 - 34.0 pg   MCHC 34.2 30.0 - 36.0 g/dL   RDW 66.5 99.3 - 57.0 %   Platelets 186 150 - 400 K/uL   nRBC 0.0 0.0 - 0.2 %  Basic metabolic panel     Status: Abnormal   Collection Time: 06/30/20  4:49 AM  Result Value Ref Range   Sodium 125 (L) 135 - 145 mmol/L   Potassium 3.8 3.5 - 5.1 mmol/L   Chloride 90 (L) 98 - 111 mmol/L   CO2 29 22 - 32 mmol/L   Glucose, Bld 199 (H) 70 - 99 mg/dL   BUN 35 (H) 8 - 23 mg/dL   Creatinine, Ser 07/02/20 0.44 - 1.00 mg/dL   Calcium 8.5 (L) 8.9 - 10.3 mg/dL   GFR, Estimated 9.39 >03 mL/min   Anion gap 6 5 - 15    DG HIP OPERATIVE UNILAT W OR W/O PELVIS RIGHT  Result Date: 06/28/2020 CLINICAL DATA:  Right hip replacement FLUOROSCOPY TIME:  3.2 seconds. Images: 2 EXAM: OPERATIVE RIGHT HIP (WITH PELVIS IF PERFORMED) 2 VIEWS TECHNIQUE: Fluoroscopic spot image(s) were submitted for interpretation post-operatively. COMPARISON:  June 24, 2020 FINDINGS: The  patient is status post right hip replacement. Hardware is in good position. IMPRESSION: Right hip replacement as above. Electronically Signed   By: June 26, 2020 III M.D   On: 06/28/2020 12:45    Assessment/Plan: 2 Days Post-Op   Principal Problem:   Closed displaced fracture of right femoral neck (HCC) Active Problems:   Alzheimer's dementia without behavioral disturbance (HCC)   Atrial fibrillation and flutter (HCC)   Benign essential HTN   PAD (peripheral artery disease) (HCC)   Status post placement of cardiac pacemaker   Chronic anticoagulation   Preoperative clearance   Distal radius fracture, right   Protein-calorie malnutrition, severe   Advance diet Up with therapy  WBAT RLE Remain in splint with constant elevation in RUE HGB steady at 11 Continue pain control Continue home Eliquis Follow up March 7 or 8 for staple removal. Call office for appt [phone] Discharge per hospitalist SNF  [phone] , PA-C 06/30/2020, 7:54 AM

## 2020-07-01 ENCOUNTER — Encounter: Payer: Self-pay | Admitting: Internal Medicine

## 2020-07-01 DIAGNOSIS — S72001A Fracture of unspecified part of neck of right femur, initial encounter for closed fracture: Secondary | ICD-10-CM | POA: Diagnosis not present

## 2020-07-01 LAB — CBC
HCT: 30.1 % — ABNORMAL LOW (ref 36.0–46.0)
Hemoglobin: 10.3 g/dL — ABNORMAL LOW (ref 12.0–15.0)
MCH: 29.8 pg (ref 26.0–34.0)
MCHC: 34.2 g/dL (ref 30.0–36.0)
MCV: 87 fL (ref 80.0–100.0)
Platelets: 193 10*3/uL (ref 150–400)
RBC: 3.46 MIL/uL — ABNORMAL LOW (ref 3.87–5.11)
RDW: 13.6 % (ref 11.5–15.5)
WBC: 12.4 10*3/uL — ABNORMAL HIGH (ref 4.0–10.5)
nRBC: 0 % (ref 0.0–0.2)

## 2020-07-01 LAB — SODIUM
Sodium: 123 mmol/L — ABNORMAL LOW (ref 135–145)
Sodium: 126 mmol/L — ABNORMAL LOW (ref 135–145)
Sodium: 128 mmol/L — ABNORMAL LOW (ref 135–145)
Sodium: 129 mmol/L — ABNORMAL LOW (ref 135–145)

## 2020-07-01 LAB — BASIC METABOLIC PANEL
Anion gap: 5 (ref 5–15)
BUN: 36 mg/dL — ABNORMAL HIGH (ref 8–23)
CO2: 29 mmol/L (ref 22–32)
Calcium: 8.1 mg/dL — ABNORMAL LOW (ref 8.9–10.3)
Chloride: 90 mmol/L — ABNORMAL LOW (ref 98–111)
Creatinine, Ser: 0.69 mg/dL (ref 0.44–1.00)
GFR, Estimated: >60 mL/min (ref >60)
Glucose, Bld: 136 mg/dL — ABNORMAL HIGH (ref 70–99)
Potassium: 3.9 mmol/L (ref 3.5–5.1)
Sodium: 124 mmol/L — ABNORMAL LOW (ref 135–145)

## 2020-07-01 LAB — OSMOLALITY: Osmolality: 273 mOsm/kg — ABNORMAL LOW (ref 275–295)

## 2020-07-01 LAB — SODIUM, URINE, RANDOM: Sodium, Ur: 16 mmol/L

## 2020-07-01 LAB — OSMOLALITY, URINE: Osmolality, Ur: 737 mOsm/kg (ref 300–900)

## 2020-07-01 MED ORDER — LORAZEPAM 2 MG/ML IJ SOLN
1.0000 mg | Freq: Once | INTRAMUSCULAR | Status: AC
  Administered 2020-07-01: 1 mg via INTRAVENOUS
  Filled 2020-07-01: qty 1

## 2020-07-01 NOTE — Progress Notes (Signed)
MEDICATION RELATED CONSULT NOTE - INITIAL   Pharmacy Consult for Hypertonic saline/Sodium monitoring Indication: hyponatremia  Allergies  Allergen Reactions  . Lisinopril Other (See Comments)    Patient Measurements: Height: 5\' 6"  (167.6 cm) Weight: 44.6 kg (98 lb 5.2 oz) IBW/kg (Calculated) : 59.3 Adjusted Body Weight: ** *  Vital Signs: Temp: 98.4 F (36.9 C) (02/22 2150) Temp Source: Oral (02/22 2150) BP: 137/86 (02/22 2100) Pulse Rate: 61 (02/22 2100) Intake/Output from previous day: 02/22 0701 - 02/23 0700 In: 240 [P.O.:240] Out: 600 [Urine:600] Intake/Output from this shift: No intake/output data recorded.  Labs: Recent Labs    06/29/20 0349 06/30/20 0449 07/01/20 0129  WBC 12.6* 13.1* 12.4*  HGB 11.0* 10.3* 10.3*  HCT 31.7* 30.1* 30.1*  PLT 177 186 193  CREATININE 0.63 0.81 0.69   Estimated Creatinine Clearance: 32.2 mL/min (by C-G formula based on SCr of 0.69 mg/dL).   Microbiology: Recent Results (from the past 720 hour(s))  Resp Panel by RT-PCR (Flu A&B, Covid)     Status: None   Collection Time: 06/25/20  2:28 AM  Result Value Ref Range Status   SARS Coronavirus 2 by RT PCR NEGATIVE NEGATIVE Final    Comment: (NOTE) SARS-CoV-2 target nucleic acids are NOT DETECTED.  The SARS-CoV-2 RNA is generally detectable in upper respiratory specimens during the acute phase of infection. The lowest concentration of SARS-CoV-2 viral copies this assay can detect is 138 copies/mL. A negative result does not preclude SARS-Cov-2 infection and should not be used as the sole basis for treatment or other patient management decisions. A negative result may occur with  improper specimen collection/handling, submission of specimen other than nasopharyngeal swab, presence of viral mutation(s) within the areas targeted by this assay, and inadequate number of viral copies(<138 copies/mL). A negative result must be combined with clinical observations, patient history,  and epidemiological information. The expected result is Negative.  Fact Sheet for Patients:  06/27/20  Fact Sheet for Healthcare Providers:  BloggerCourse.com  This test is no t yet approved or cleared by the [place] FDA and  has been authorized for detection and/or diagnosis of SARS-CoV-2 by FDA under an Emergency Use Authorization (EUA). This EUA will remain  in effect (meaning this test can be used) for the duration of the COVID-19 declaration under Section 564(b)(1) of the Act, 21 U.S.C.section 360bbb-3(b)(1), unless the authorization is terminated  or revoked sooner.       Influenza A by PCR NEGATIVE NEGATIVE Final   Influenza B by PCR NEGATIVE NEGATIVE Final    Comment: (NOTE) The Xpert Xpress SARS-CoV-2/FLU/RSV plus assay is intended as an aid in the diagnosis of influenza from Nasopharyngeal swab specimens and should not be used as a sole basis for treatment. Nasal washings and aspirates are unacceptable for Xpert Xpress SARS-CoV-2/FLU/RSV testing.  Fact Sheet for Patients: [place]  Fact Sheet for Healthcare Providers: BloggerCourse.com  This test is not yet approved or cleared by the [place] FDA and has been authorized for detection and/or diagnosis of SARS-CoV-2 by FDA under an Emergency Use Authorization (EUA). This EUA will remain in effect (meaning this test can be used) for the duration of the COVID-19 declaration under Section 564(b)(1) of the Act, 21 U.S.C. section 360bbb-3(b)(1), unless the authorization is terminated or revoked.  Performed at [facility], [address], [place], [place]     Medical History: Past Medical History:  Diagnosis Date  . Atrial fibrillation (HCC)   . CHF (congestive heart failure) (HCC)   .  Dementia (HCC)     Medications:  Scheduled:  . apixaban  2.5 mg Oral BID  .  bisacodyl  10 mg Oral Daily  . Chlorhexidine Gluconate Cloth  6 each Topical Daily  . docusate sodium  100 mg Oral BID  . feeding supplement  237 mL Oral BID BM  . lidocaine  1 patch Transdermal Q24H  . losartan  25 mg Oral Daily  . metoprolol tartrate  25 mg Oral Q6H  . multivitamin with minerals  1 tablet Oral Daily  . pantoprazole  40 mg Oral Daily  . polyethylene glycol  17 g Oral BID  . QUEtiapine  25 mg Oral QHS  . senna-docusate  2 tablet Oral BID   Infusions:  .  ceFAZolin (ANCEF) IV    . 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  Furosemide d/ced Nephrology following Na q4h  2/22 1609 Na 123 2/22 2357 Na 123 2/23 0129 Na 124   Goal of Therapy:  Na WNL  Plan:  MD has ordered 3% saline drip to start at 30 ml/hr x 48 hrs and Sodium level q4h.   If sodium rises > 4 mEq/L over 2 hours or > 6 mEq/L over 4 hours - Pharmacy will evaluate and contact MD per consult.   Otelia Sergeant, PharmD, [facility] 07/01/2020 2:55 AM

## 2020-07-01 NOTE — Progress Notes (Signed)
MEDICATION RELATED CONSULT NOTE  Pharmacy Consult for Hypertonic saline/Sodium monitoring Indication: hyponatremia  Allergies  Allergen Reactions  . Lisinopril Other (See Comments)    Patient Measurements: Height: 5\' 6"  (167.6 cm) Weight: 44.6 kg (98 lb 5.2 oz) IBW/kg (Calculated) : 59.3  Vital Signs: Temp: 98.4 F (36.9 C) (02/22 2150) Temp Source: Oral (02/22 2150) BP: 131/83 (02/23 0306) Pulse Rate: 116 (02/23 0306) Intake/Output from previous day: 02/22 0701 - 02/23 0700 In: 360.3 [P.O.:240; I.V.:120.3] Out: 600 [Urine:600] Intake/Output from this shift: No intake/output data recorded.  Labs: Recent Labs    06/29/20 0349 06/30/20 0449 07/01/20 0129  WBC 12.6* 13.1* 12.4*  HGB 11.0* 10.3* 10.3*  HCT 31.7* 30.1* 30.1*  PLT 177 186 193  CREATININE 0.63 0.81 0.69   Estimated Creatinine Clearance: 32.2 mL/min (by C-G formula based on SCr of 0.69 mg/dL).   Microbiology: Recent Results (from the past 720 hour(s))  Resp Panel by RT-PCR (Flu A&B, Covid)     Status: None   Collection Time: 06/25/20  2:28 AM  Result Value Ref Range Status   SARS Coronavirus 2 by RT PCR NEGATIVE NEGATIVE Final    Comment: (NOTE) SARS-CoV-2 target nucleic acids are NOT DETECTED.  The SARS-CoV-2 RNA is generally detectable in upper respiratory specimens during the acute phase of infection. The lowest concentration of SARS-CoV-2 viral copies this assay can detect is 138 copies/mL. A negative result does not preclude SARS-Cov-2 infection and should not be used as the sole basis for treatment or other patient management decisions. A negative result may occur with  improper specimen collection/handling, submission of specimen other than nasopharyngeal swab, presence of viral mutation(s) within the areas targeted by this assay, and inadequate number of viral copies(<138 copies/mL). A negative result must be combined with clinical observations, patient history, and  epidemiological information. The expected result is Negative.  Fact Sheet for Patients:  06/27/20  Fact Sheet for Healthcare Providers:  BloggerCourse.com  This test is no t yet approved or cleared by the [place] FDA and  has been authorized for detection and/or diagnosis of SARS-CoV-2 by FDA under an Emergency Use Authorization (EUA). This EUA will remain  in effect (meaning this test can be used) for the duration of the COVID-19 declaration under Section 564(b)(1) of the Act, 21 U.S.C.section 360bbb-3(b)(1), unless the authorization is terminated  or revoked sooner.       Influenza A by PCR NEGATIVE NEGATIVE Final   Influenza B by PCR NEGATIVE NEGATIVE Final    Comment: (NOTE) The Xpert Xpress SARS-CoV-2/FLU/RSV plus assay is intended as an aid in the diagnosis of influenza from Nasopharyngeal swab specimens and should not be used as a sole basis for treatment. Nasal washings and aspirates are unacceptable for Xpert Xpress SARS-CoV-2/FLU/RSV testing.  Fact Sheet for Patients: [place]  Fact Sheet for Healthcare Providers: BloggerCourse.com  This test is not yet approved or cleared by the [employer] FDA and has been authorized for detection and/or diagnosis of SARS-CoV-2 by FDA under an Emergency Use Authorization (EUA). This EUA will remain in effect (meaning this test can be used) for the duration of the COVID-19 declaration under Section 564(b)(1) of the Act, 21 U.S.C. section 360bbb-3(b)(1), unless the authorization is terminated or revoked.  Performed at [facility], [address] [place], [place]     Medical History: Past Medical History:  Diagnosis Date  . Atrial fibrillation (HCC)   . CHF (congestive heart failure) (HCC)   . Dementia (HCC)  Medications:  Scheduled:  . apixaban  2.5 mg Oral BID  .  bisacodyl  10 mg Oral Daily  . Chlorhexidine Gluconate Cloth  6 each Topical Daily  . docusate sodium  100 mg Oral BID  . feeding supplement  237 mL Oral BID BM  . lidocaine  1 patch Transdermal Q24H  . losartan  25 mg Oral Daily  . metoprolol tartrate  25 mg Oral Q6H  . multivitamin with minerals  1 tablet Oral Daily  . pantoprazole  40 mg Oral Daily  . polyethylene glycol  17 g Oral BID  . QUEtiapine  25 mg Oral QHS  . senna-docusate  2 tablet Oral BID   Infusions:  .  ceFAZolin (ANCEF) IV    . 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 The most recent sodium is 129 (baseline 135)  Goal of Therapy:  sodium WNL  Plan:   hypertonic saline has been placed on hold pending results 4 hours later at 1500: Dr Thedore Mins to decide whether or not to continue infusion   If sodium rises > 4 mEq/L over 2 hours or > 6 mEq/L over 4 hours - Pharmacy will evaluate and contact MD per consult.   Burnis Medin, PharmD 07/01/2020 7:14 AM

## 2020-07-01 NOTE — Progress Notes (Signed)
PROGRESS NOTE    Kristen Osborn  IDP:[id number] DOB: [birth year] DOA: 06/24/2020 PCP: Danella Penton, MD   Brief Narrative: Taken from prior notes. [name] a 87 y.o.femalewith medical history significant foratrial fibrillation on Eliquis, diastolic heart failure, dementia, HTN, PAD pacemaker placement, was brought to the emergency room following an accidental fall.:X-ray right hip: Displaced right femoral neck fracture s/p ORIF on 06/28/2020 with Dr. Odis Luster.  Tolerated the procedure well.  On 06/26/2020 Foley catheter was attempted and during advancement it emerged out of the vagina, there was some concern of fistula, which will need outpatient evaluation.  Patient is not a candidate for any advanced surgeries. Per daughter patient had issues with Foley placement in the past.  Developed postoperative hyponatremia, not responding to IV fluid so she was transferred to ICU for hypertonic saline and nephrology was consulted.  Subjective: Patient denies any new complaint today.  She was oriented to self only.  Daughter at bedside.  Patient has an history of advanced dementia at baseline. Having poor p.o. intake and gradually declining health. 2 recent fractures, right upper extremity followed by right femoral neck.  Assessment & Plan:   Principal Problem:   Closed displaced fracture of right femoral neck (HCC) Active Problems:   Alzheimer's dementia without behavioral disturbance (HCC)   Atrial fibrillation and flutter (HCC)   Benign essential HTN   PAD (peripheral artery disease) (HCC)   Status post placement of cardiac pacemaker   Chronic anticoagulation   Preoperative clearance   Distal radius fracture, right   Protein-calorie malnutrition, severe  Right femoral neck and distal right radius fracture secondary to mechanical fall.  S/p hemiarthroplasty and reduction of her right wrist. She was taken to the OR on 06/28/2020 as she was on Eliquis at home, waited few days for  Eliquis washout. PT is recommending SNF placement. Patient with declining health, worsening dementia and no recent hip fracture, currently full code.  High risk for deterioration and death. -Palliative care consult -Continue with physical therapy. -Eliquis was restarted postoperatively. -She will follow-up with orthopedic surgery as an outpatient on 3/7 or 8 for staple removal.  Hyponatremia.  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  Nephrology was consulted as he was transferred to ICU for hypertonic saline. Sodium improving. -Continue to monitor, goal correction up to 130-132 next 24-hour  Atrial fibrillation and flutter.  Patient with RVR while in ED which resolved with one-time dose of IV diltiazem.  Currently heart rate controlled Her home dose of Eliquis was held initially and resumed postoperatively. 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 Concern of fistula per nursing staff as Foley catheter imaged out of vagina on progression.  Urology was consulted and they are recommending outpatient follow-up. According to urology recommendation Foley catheter should remain in for at least 1 week and voiding trial should be obtained after constipation resolved, constipation resolved today. In addition to the above, it sounds based on her history, that she may have some urethral atrophy versus kinking versus stenosis or perhaps that urethra is somewhat ectopic as a result in that the catheter may have just simply coiled in the vagina rather than the possibility of a vesicovaginal fistula. Either way, we will not plan for any further work-up of this at this time. No need for further imaging. -We will give her a voiding trial tomorrow.  Alzheimer's dementia without behavioral disturbance (HCC) -Continue Haldol as needed behavioral disturbance -continue quetiapine  Essential HTN.  Blood  pressure within goal. -Continue home dose of losartan and beta-blocker.  History of cardiac pacemaker.  No acute concern  Objective: Vitals:   07/01/20 1200 07/01/20 1300 07/01/20 1301 07/01/20 1400  BP: (!) 152/91 138/62 138/62 (!) 139/56  Pulse: 83 (!) 101  86  Resp: 18 17  (!) 27  Temp:      TempSrc: Oral     SpO2: 96% 94%  93%  Weight:      Height:        Intake/Output Summary (Last 24 hours) at 07/01/2020 1710 Last data filed at 07/01/2020 1400 Gross per 24 hour  Intake 1091.93 ml  Output 710 ml  Net 381.93 ml   Filed Weights   06/24/20 2319 06/25/20 0411  Weight: 45.4 kg 44.6 kg    Examination:  General exam: Very frail elderly lady, appears calm and comfortable  Respiratory system: Clear to auscultation. Respiratory effort normal. Cardiovascular system: S1 & S2 heard, RRR.  Gastrointestinal system: Soft, nontender, nondistended, bowel sounds positive. Central nervous system: Alert and oriented to self only. No focal neurological deficits. Extremities: No edema, no cyanosis, pulses intact and symmetrical. Psychiatry: Judgement and insight appear impaired   DVT prophylaxis: Eliquis Code Status: Full Family Communication: Daughter was updated at bedside. Disposition Plan:  Status is: Inpatient  Remains inpatient appropriate because:Inpatient level of care appropriate due to severity of illness   Dispo: The patient is from: Home              Anticipated d/c is to: SNF              Anticipated d/c date is: 2 days              Patient currently is not medically stable to d/c.   Difficult to place patient No              Level of care: Stepdown  All the records are reviewed and case discussed with Care Management/Social Worker. Management plans discussed with the patient, nursing and they are in agreement.  Consultants:   Nephrology  Orthopedic surgery  Procedures:  Antimicrobials:   Data Reviewed: I have personally reviewed following labs and  imaging studies  CBC: Recent Labs  Lab 06/25/20 0022 06/28/20 0431 06/29/20 0349 06/30/20 0449 07/01/20 0129  WBC 5.1 11.1* 12.6* 13.1* 12.4*  NEUTROABS 3.0  --   --   --   --   HGB 12.3 11.8* 11.0* 10.3* 10.3*  HCT 37.1 34.6* 31.7* 30.1* 30.1*  MCV 88.5 86.1 85.0 87.2 87.0  PLT 140* 164 177 186 193   Basic Metabolic Panel: Recent Labs  Lab 06/25/20 0022 06/28/20 0431 06/29/20 0349 06/30/20 0449 06/30/20 1609 06/30/20 2357 07/01/20 0129 07/01/20 1053  NA 135 125* 124* 125* 123* 123* 124* 129*  K 3.7 3.5 3.9 3.8  --   --  3.9  --   CL 100 89* 90* 90*  --   --  90*  --   CO2 25 27 26 29   --   --  29  --   GLUCOSE 129* 130* 155* 199*  --   --  136*  --   BUN 21 30* 32* 35*  --   --  36*  --   CREATININE 0.91 0.66 0.63 0.81  --   --  0.69  --   CALCIUM 9.1 8.8* 8.4* 8.5*  --   --  8.1*  --    GFR: Estimated Creatinine Clearance: 32.2 mL/min (  by C-G formula based on SCr of 0.69 mg/dL). Liver Function Tests: No results for input(s): AST, ALT, ALKPHOS, BILITOT, PROT, ALBUMIN in the last 168 hours. No results for input(s): LIPASE, AMYLASE in the last 168 hours. No results for input(s): AMMONIA in the last 168 hours. Coagulation Profile: Recent Labs  Lab 06/25/20 0022  INR 1.2   Cardiac Enzymes: No results for input(s): CKTOTAL, CKMB, CKMBINDEX, TROPONINI in the last 168 hours. BNP (last 3 results) No results for input(s): PROBNP in the last 8760 hours. HbA1C: No results for input(s): HGBA1C in the last 72 hours. CBG: Recent Labs  Lab 06/26/20 0815 06/30/20 2053  GLUCAP 144* 152*   Lipid Profile: No results for input(s): CHOL, HDL, LDLCALC, TRIG, CHOLHDL, LDLDIRECT in the last 72 hours. Thyroid Function Tests: No results for input(s): TSH, T4TOTAL, FREET4, T3FREE, THYROIDAB in the last 72 hours. Anemia Panel: No results for input(s): VITAMINB12, FOLATE, FERRITIN, TIBC, IRON, RETICCTPCT in the last 72 hours. Sepsis Labs: No results for input(s): PROCALCITON,  LATICACIDVEN in the last 168 hours.  Recent Results (from the past 240 hour(s))  Resp Panel by RT-PCR (Flu A&B, Covid)     Status: None   Collection Time: 06/25/20  2:28 AM  Result Value Ref Range Status   SARS Coronavirus 2 by RT PCR NEGATIVE NEGATIVE Final    Comment: (NOTE) SARS-CoV-2 target nucleic acids are NOT DETECTED.  The SARS-CoV-2 RNA is generally detectable in upper respiratory specimens during the acute phase of infection. The lowest concentration of SARS-CoV-2 viral copies this assay can detect is 138 copies/mL. A negative result does not preclude SARS-Cov-2 infection and should not be used as the sole basis for treatment or other patient management decisions. A negative result may occur with  improper specimen collection/handling, submission of specimen other than nasopharyngeal swab, presence of viral mutation(s) within the areas targeted by this assay, and inadequate number of viral copies(<138 copies/mL). A negative result must be combined with clinical observations, patient history, and epidemiological information. The expected result is Negative.  Fact Sheet for Patients:  BloggerCourse.com  Fact Sheet for Healthcare Providers:  [employer]ious[employer]ker.[place]  This test is no t yet approved or cleared by the [place] FDA and  has been authorized for detection and/or diagnosis of SARS-CoV-2 by FDA under an Emergency Use Authorization (EUA). This EUA will remain  in effect (meaning this test can be used) for the duration of the COVID-19 declaration under Section 564(b)(1) of the Act, 21 U.S.C.section 360bbb-3(b)(1), unless the authorization is terminated  or revoked sooner.       Influenza A by PCR NEGATIVE NEGATIVE Final   Influenza B by PCR NEGATIVE NEGATIVE Final    Comment: (NOTE) The Xpert Xpress SARS-CoV-2/FLU/RSV plus assay is intended as an aid in the diagnosis of influenza from Nasopharyngeal swab specimens  and should not be used as a sole basis for treatment. Nasal washings and aspirates are unacceptable for Xpert Xpress SARS-CoV-2/FLU/RSV testing.  Fact Sheet for Patients: BloggerCourse.com  Fact Sheet for Healthcare Providers: SeriousBroker.[place]  This test is not yet approved or cleared by the [place] FDA and has been authorized for detection and/or diagnosis of SARS-CoV-2 by FDA under an Emergency Use Authorization (EUA). This EUA will remain in effect (meaning this test can be used) for the duration of the COVID-19 declaration under Section 564(b)(1) of the Act, 21 U.S.C. section 360bbb-3(b)(1), unless the authorization is terminated or revoked.  Performed at [facility], [address], [place], [place] [address]  Radiology Studies: No results found.  Scheduled Meds: . apixaban  2.5 mg Oral BID  . bisacodyl  10 mg Oral Daily  . Chlorhexidine Gluconate Cloth  6 each Topical Daily  . docusate sodium  100 mg Oral BID  . feeding supplement  237 mL Oral BID BM  . lidocaine  1 patch Transdermal Q24H  . losartan  25 mg Oral Daily  . metoprolol tartrate  25 mg Oral Q6H  . multivitamin with minerals  1 tablet Oral Daily  . pantoprazole  40 mg Oral Daily  . polyethylene glycol  17 g Oral BID  . QUEtiapine  25 mg Oral QHS  . senna-docusate  2 tablet Oral BID   Continuous Infusions: .  ceFAZolin (ANCEF) IV    . sodium chloride (hypertonic) 30 mL/hr at 07/01/20 1000     LOS: 6 days   Time spent: 45 minutes. More than 50% of the time was spent in counseling/coordination of care  Arnetha Courser, MD [facility]  If 7PM-7AM, please contact night-coverage [email or web address]  07/01/2020, 5:10 PM   This record has been created using Conservation officer, historic buildings. Errors have been sought and corrected,but may not always be located. Such creation errors do not reflect on the standard of care.

## 2020-07-01 NOTE — TOC Progression Note (Addendum)
Transition of Care [facility]) - Progression Note    Patient Details  Name: Kristen Osborn MRN: [id number] Date of Birth: [birth year]  [facility] CM/SW Contact  Shelbie Ammons, RN Phone Number: 07/01/2020, 7:52 AM  Clinical Narrative:  RNCM met with patient and daughter at bedside. Patient is alert and verbally responsive this afternoon. Bed offers were presented and they chose [facility] as this was there original choice. RNCM accepted bed in the hub and notified Magda Paganini with facility     Expected Discharge Plan: [place] Barriers to Discharge: No Barriers Identified  Expected Discharge Plan and Services Expected Discharge Plan: [place] arrangements for the past 2 months: [place]                                       Social Determinants of Health (SDOH) Interventions    Readmission Risk Interventions No flowsheet data found.

## 2020-07-01 NOTE — Progress Notes (Signed)
Patient agitated. Patient refuses to follow commands for the neuro checks. Daughter [name] at bedside.

## 2020-07-01 NOTE — Progress Notes (Signed)
Patient given ativan per orders for agitation. Patient now lethargic and unable to participate in an accurate neuro check. Will reassess as necessary.

## 2020-07-01 NOTE — Progress Notes (Signed)
OT Cancellation Note  Patient Details Name: Kristen Osborn MRN: [id number] DOB: 1926/05/19   Cancelled Treatment:    Reason Eval/Treat Not Completed: Patient not medically ready. Chart reviewed - pt has transitioned to higher level of care. Per therapy protocols, will require new orders or continue at transfer orders to initiate therapy services. Will follow acutely and see pt as medically appropriate.  Kathie Dike, M.S. OTR/L  07/01/20, 2:19 PM  ascom [phone]

## 2020-07-01 NOTE — Progress Notes (Signed)
[facility] [place], [place], [place] 07/01/20  Subjective:   LOS: 6  Kristen Osborn is a 87 y.o. female with past medical history of Atrial Fibrillation, CHF and Dementia. She presents to the ED after a fall. She has underwent a right hip anterior hemiarthroplasty. We were consulted for hyponatremia.  Patient is seen today resting comfortable in bed. Transferred to ICU  Poor appetite Na still low Started on 3% saline   Objective:  Vital signs in last 24 hours:  Temp:  [97.8 F (36.6 C)-99.1 F (37.3 C)] 98.4 F (36.9 C) (02/22 2150) Pulse Rate:  [61-116] 116 (02/23 0306) Resp:  [15-20] 17 (02/22 2100) BP: (121-148)/(73-86) 131/83 (02/23 0306) SpO2:  [92 %-96 %] 96 % (02/22 2100)  Weight change:  Filed Weights   06/24/20 2319 06/25/20 0411  Weight: 45.4 kg 44.6 kg    Intake/Output:    Intake/Output Summary (Last 24 hours) at 07/01/2020 0852 Last data filed at 07/01/2020 0200 Gross per 24 hour  Intake 360.31 ml  Output 600 ml  Net -239.69 ml   Physical Exam: General:  No acute distress, laying in the bed, frail, elderly  HEENT  anicteric, moist oral mucous membrane  Pulm/lungs  normal breathing effort, lungs are clear  CVS/Heart  irregular rhythm, no rub or gallop  Abdomen:   Soft, nontender  Extremities:  No peripheral edema  Neurologic:  lethargic today  Skin:  No acute rashes  foley  Basic Metabolic Panel:  Recent Labs  Lab 06/25/20 0022 06/28/20 0431 06/29/20 0349 06/30/20 0449 06/30/20 1609 06/30/20 2357 07/01/20 0129  NA 135 125* 124* 125* 123* 123* 124*  K 3.7 3.5 3.9 3.8  --   --  3.9  CL 100 89* 90* 90*  --   --  90*  CO2 25 27 26 29   --   --  29  GLUCOSE 129* 130* 155* 199*  --   --  136*  BUN 21 30* 32* 35*  --   --  36*  CREATININE 0.91 0.66 0.63 0.81  --   --  0.69  CALCIUM 9.1 8.8* 8.4* 8.5*  --   --  8.1*     CBC: Recent Labs  Lab 06/25/20 0022 06/28/20 0431 06/29/20 0349 06/30/20 0449 07/01/20 0129  WBC 5.1  11.1* 12.6* 13.1* 12.4*  NEUTROABS 3.0  --   --   --   --   HGB 12.3 11.8* 11.0* 10.3* 10.3*  HCT 37.1 34.6* 31.7* 30.1* 30.1*  MCV 88.5 86.1 85.0 87.2 87.0  PLT 140* 164 177 186 193     No results found for: HEPBSAG, HEPBSAB, HEPBIGM    Microbiology:  Recent Results (from the past 240 hour(s))  Resp Panel by RT-PCR (Flu A&B, Covid)     Status: None   Collection Time: 06/25/20  2:28 AM  Result Value Ref Range Status   SARS Coronavirus 2 by RT PCR NEGATIVE NEGATIVE Final    Comment: (NOTE) SARS-CoV-2 target nucleic acids are NOT DETECTED.  The SARS-CoV-2 RNA is generally detectable in upper respiratory specimens during the acute phase of infection. The lowest concentration of SARS-CoV-2 viral copies this assay can detect is 138 copies/mL. A negative result does not preclude SARS-Cov-2 infection and should not be used as the sole basis for treatment or other patient management decisions. A negative result may occur with  improper specimen collection/handling, submission of specimen other than nasopharyngeal swab, presence of viral mutation(s) within the areas targeted by this assay, and inadequate  number of viral copies(<138 copies/mL). A negative result must be combined with clinical observations, patient history, and epidemiological information. The expected result is Negative.  Fact Sheet for Patients:  BloggerCourse.com  Fact Sheet for Healthcare Providers:  SeriousBroker.it  This test is no t yet approved or cleared by the [place] FDA and  has been authorized for detection and/or diagnosis of SARS-CoV-2 by FDA under an Emergency Use Authorization (EUA). This EUA will remain  in effect (meaning this test can be used) for the duration of the COVID-19 declaration under Section 564(b)(1) of the Act, 21 U.S.C.section 360bbb-3(b)(1), unless the authorization is terminated  or revoked sooner.       Influenza A by  PCR NEGATIVE NEGATIVE Final   Influenza B by PCR NEGATIVE NEGATIVE Final    Comment: (NOTE) The Xpert Xpress SARS-CoV-2/FLU/RSV plus assay is intended as an aid in the diagnosis of influenza from Nasopharyngeal swab specimens and should not be used as a sole basis for treatment. Nasal washings and aspirates are unacceptable for Xpert Xpress SARS-CoV-2/FLU/RSV testing.  Fact Sheet for Patients: BloggerCourse.com  Fact Sheet for Healthcare Providers: SeriousBroker.it  This test is not yet approved or cleared by the [place] FDA and has been authorized for detection and/or diagnosis of SARS-CoV-2 by FDA under an Emergency Use Authorization (EUA). This EUA will remain in effect (meaning this test can be used) for the duration of the COVID-19 declaration under Section 564(b)(1) of the Act, 21 U.S.C. section 360bbb-3(b)(1), unless the authorization is terminated or revoked.  Performed at [facility], [address] [place], [place] [address]     Coagulation Studies: No results for input(s): LABPROT, INR in the last 72 hours.  Urinalysis: No results for input(s): COLORURINE, LABSPEC, PHURINE, GLUCOSEU, HGBUR, BILIRUBINUR, KETONESUR, PROTEINUR, UROBILINOGEN, NITRITE, LEUKOCYTESUR in the last 72 hours.  Invalid input(s): APPERANCEUR    Imaging: No results found.   Medications:   .  ceFAZolin (ANCEF) IV    . sodium chloride (hypertonic) 30 mL/hr at 07/01/20 0200   . apixaban  2.5 mg Oral BID  . bisacodyl  10 mg Oral Daily  . Chlorhexidine Gluconate Cloth  6 each Topical Daily  . docusate sodium  100 mg Oral BID  . feeding supplement  237 mL Oral BID BM  . lidocaine  1 patch Transdermal Q24H  . losartan  25 mg Oral Daily  . metoprolol tartrate  25 mg Oral Q6H  . multivitamin with minerals  1 tablet Oral Daily  . pantoprazole  40 mg Oral Daily  . polyethylene glycol  17 g Oral BID  . QUEtiapine  25 mg Oral  QHS  . senna-docusate  2 tablet Oral BID   hydrALAZINE, HYDROcodone-acetaminophen, metoCLOPramide **OR** metoCLOPramide (REGLAN) injection, morphine injection, ondansetron **OR** ondansetron (ZOFRAN) IV  Assessment/ Plan:  87 y.o. female with  was admitted on 06/24/2020 for  Principal Problem:   Closed displaced fracture of right femoral neck (HCC) Active Problems:   Alzheimer's dementia without behavioral disturbance (HCC)   Atrial fibrillation and flutter (HCC)   Benign essential HTN   PAD (peripheral artery disease) (HCC)   Status post placement of cardiac pacemaker   Chronic anticoagulation   Preoperative clearance   Distal radius fracture, right   Protein-calorie malnutrition, severe  Closed right hip fracture (HCC) [S72.001A] Fall, initial encounter [W19.XXXA] Closed displaced fracture of right femoral neck (HCC) [S72.001A] Closed fracture of distal end of right radius, unspecified fracture morphology, initial encounter [S52.501A]  #. Hyponatremia  -likely  due to postop SIADH -Baseline Sodium- 135 (Jun 25, 2020) -Na at 124 today -3% hypertonic saline Na check q 4 hrs Will adjust the rate accordingly Goal for correction 130-132 by AM     LOS: 6 Kaesha Kirsch Thedore Mins 2/23/20228:52 AM  [facility] [place], [place] [phone]

## 2020-07-01 NOTE — Progress Notes (Signed)
PT Cancellation Note  Patient Details Name: Kristen Osborn MRN: [id number] DOB: 05/23/1926   Cancelled Treatment:    Reason Eval/Treat Not Completed: Medical issues which prohibited therapy.  Chart reviewed.  Pt transferred to CCU.  D/t pt transferring to higher level of care, per PT protocol require new PT consult in order to continue therapy (will discontinue current PT order d/t this); OT notified attending MD via secure chat regarding therapy needing new orders when pt is stable for mobility.  Please re-consult PT when pt is medically appropriate to participate in PT.  Hendricks Limes, PT 07/01/20, 2:13 PM

## 2020-07-02 DIAGNOSIS — Z7189 Other specified counseling: Secondary | ICD-10-CM | POA: Diagnosis not present

## 2020-07-02 DIAGNOSIS — Z515 Encounter for palliative care: Secondary | ICD-10-CM | POA: Diagnosis not present

## 2020-07-02 DIAGNOSIS — S72001A Fracture of unspecified part of neck of right femur, initial encounter for closed fracture: Secondary | ICD-10-CM

## 2020-07-02 DIAGNOSIS — F0280 Dementia in other diseases classified elsewhere without behavioral disturbance: Secondary | ICD-10-CM

## 2020-07-02 DIAGNOSIS — G309 Alzheimer's disease, unspecified: Secondary | ICD-10-CM | POA: Diagnosis not present

## 2020-07-02 DIAGNOSIS — Z66 Do not resuscitate: Secondary | ICD-10-CM

## 2020-07-02 LAB — SODIUM
Sodium: 130 mmol/L — ABNORMAL LOW (ref 135–145)
Sodium: 131 mmol/L — ABNORMAL LOW (ref 135–145)

## 2020-07-02 NOTE — Consult Note (Signed)
Consultation Note Date: 07/02/2020   Patient Name: Kristen Osborn  DOB: [birth year]  MRN: [id number]  Age / Sex: 87 y.o., female  PCP: Rusty Aus, MD Referring Physician: Lorella Nimrod, MD  Reason for Consultation: Establishing goals of care  HPI/Patient Profile: 87 y.o. female  with past medical history of atrial fibrillation on Eliquis, diastolic heart failure, dementia, HTN, PAD, and pacemaker placement admitted on 06/24/2020 with an accidental fall. Diagnosed with right femoral neck and distal right radius fracture. ORIF on 2/20. Did require transfer to ICU for hyponatremia and hypertonic saline. Now sodium improving and transferring out of ICU. PMT consulted to discuss [facility].   Clinical Assessment and Goals of Care: I have reviewed medical records including EPIC notes, labs and imaging, received report from RN, assessed the patient and then met with patient and her daughter Jan/Mary  to discuss diagnosis prognosis, GOC, EOL wishes, disposition and options.  Patient unable to participate in [place] d/t severity of dementia. Patient appears comfortable, denies discomfort.   I introduced Palliative Medicine as specialized medical care for people living with serious illness. It focuses on providing relief from the symptoms and stress of a serious illness. The goal is to improve quality of life for both the patient and the family.  Jan tells me she has 2 sisters. The 3 of them take turns caring for the patient in her home.   As far as functional and nutritional status, Jan tells me that the patient is ambulatory. Needs some assistance with ADLs. She tells me of a good appetite. We discuss her cognitive decline.    We discussed patient's current illness and what it means in the larger context of patient's on-going co-morbidities.  Natural disease trajectory and expectations at EOL were discussed. We discuss trajectory of dementia.   We discussed a  MOST. The patient and family outlined their wishes for the following treatment decisions:  Cardiopulmonary Resuscitation: Do Not Attempt Resuscitation (DNR/No CPR)  Medical Interventions: Limited Additional Interventions: Use medical treatment, IV fluids and cardiac monitoring as indicated, DO NOT USE intubation or mechanical ventilation. May consider use of less invasive airway support such as BiPAP or CPAP. Also provide comfort measures. Transfer to the hospital if indicated. Avoid intensive care.   Antibiotics: Antibiotics if indicated  IV Fluids: IV fluids if indicated  Feeding Tube: No feeding tube   Jan feels that her sisters will agree with the above selections but we did not sign the form yet until she confirms with her sisters. Will follow up tomorrow.   We discuss issues with nutrition in the future. We discussed that evidence has shown that PEG tubes in patients with advanced dementia do not increase survival, prevent aspiration, or improve wound healing.  They can promote isolation and use of restraints leading to increased potential for pressure ulcers. Use of PEG tubes is not medically recommended in this population; rather, careful hand feeding with aspiration precautions has been shown to provide the best quality of life. Jan expresses understanding and shares she agrees that a PEG would be inappropriate.   Jan also confirms DNR status.   Discussed with patient/family the importance of continued conversation with family and the medical providers regarding overall plan of care and treatment options, ensuring decisions are within the context of the patient's values and GOCs.    Jan is hopeful patient can do well with rehab and return home to previous setup.   Questions and concerns were addressed. The family was encouraged to call  with questions or concerns.   Primary Decision Maker NEXT OF KIN - patients 3 daughters together, no designated [place]    - code status changed to DNR - MOST reviewed, will confirm tomorrow - plan for SNF - do not think she is hospice eligible as she is not FAST 7c yet - still ambulatory, maintains some vocab, decent appetite  Code Status/Advance Care Planning:  DNR  Prognosis:   Unable to determine  Discharge Planning: [place] for rehab with Palliative care service follow-up      Primary Diagnoses: Present on Admission: . Alzheimer's dementia without behavioral disturbance ([place]) . Atrial fibrillation and flutter ([place]) . Benign essential HTN . PAD (peripheral artery disease) ([place]) . Status post placement of cardiac pacemaker   I have reviewed the medical record, interviewed the patient and family, and examined the patient. The following aspects are pertinent.  Past Medical History:  Diagnosis Date  . Atrial fibrillation ([place])   . CHF (congestive heart failure) ([place])   . Dementia [facility])    Social History   Socioeconomic History  . Marital status: Married    Spouse name: Not on file  . Number of children: Not on file  . Years of education: Not on file  . Highest education level: Not on file  Occupational History  . Not on file  Tobacco Use  . Smoking status: Never Smoker  . Smokeless tobacco: Never Used  Vaping Use  . Vaping Use: Never used  Substance and Sexual Activity  . Alcohol use: Not Currently  . Drug use: Never  . Sexual activity: Not Currently  Other Topics Concern  . Not on file  Social History Narrative   lives with daughter   Social Determinants of Health   Financial Resource Strain: Not on file  Food Insecurity: Not on file  Transportation Needs: Not on file  Physical Activity: Not on file  Stress: Not on file  Social Connections: Not on file   History reviewed. No pertinent family history. Scheduled Meds: . apixaban  2.5 mg Oral BID  . bisacodyl  10 mg Oral Daily  . Chlorhexidine Gluconate Cloth  6 each Topical Daily  . docusate sodium   100 mg Oral BID  . feeding supplement  237 mL Oral BID BM  . lidocaine  1 patch Transdermal Q24H  . losartan  25 mg Oral Daily  . metoprolol tartrate  25 mg Oral Q6H  . multivitamin with minerals  1 tablet Oral Daily  . pantoprazole  40 mg Oral Daily  . polyethylene glycol  17 g Oral BID  . QUEtiapine  25 mg Oral QHS  . senna-docusate  2 tablet Oral BID   Continuous Infusions: .  ceFAZolin (ANCEF) IV     PRN Meds:.hydrALAZINE, HYDROcodone-acetaminophen, metoCLOPramide **OR** metoCLOPramide (REGLAN) injection, morphine injection, ondansetron **OR** ondansetron (ZOFRAN) IV Allergies  Allergen Reactions  . Lisinopril Other (See Comments)   Review of Systems  Unable to perform ROS: Dementia    Physical Exam Constitutional:      General: She is not in acute distress. Cardiovascular:     Rate and Rhythm: Normal rate.  Pulmonary:     Effort: Pulmonary effort is normal. No respiratory distress.  Skin:    General: Skin is warm and dry.  Neurological:     Mental Status: She is alert. She is disoriented.  Psychiatric:        Mood and Affect: Mood normal.  Vital Signs: BP 129/66   Pulse (!) 129   Temp 97.9 F (36.6 C) (Oral)   Resp 15   Ht '5\' 6"'  (1.676 m)   Wt 44.6 kg   SpO2 90%   BMI 15.87 kg/m  Pain Scale: PAINAD   Pain Score: 0-No pain   SpO2: SpO2: 90 % O2 Device:SpO2: 90 % O2 Flow Rate: .O2 Flow Rate (L/min): 8 L/min  IO: Intake/output summary:   Intake/Output Summary (Last 24 hours) at 07/02/2020 1439 Last data filed at 07/02/2020 0346 Gross per 24 hour  Intake 381.97 ml  Output 850 ml  Net -468.03 ml    LBM: Last BM Date: 07/01/20 Baseline Weight: Weight: 45.4 kg Most recent weight: Weight: 44.6 kg     Palliative Assessment/Data: PPS 50%    Time Total: 60 minutes Greater than 50%  of this time was spent counseling and coordinating care related to the above assessment and plan.  Juel Burrow, DNP, AGNP-C Palliative Medicine  Team [phone] Pager: [phone]

## 2020-07-02 NOTE — Progress Notes (Signed)
PROGRESS NOTE    Kristen Osborn  ZOX:[id number] DOB: Feb 10, 1927 DOA: 06/24/2020 PCP: Danella PentonMiller, Mark F, MD   Brief Narrative: Taken from prior notes. Kristen Osborn a 87 y.o.femalewith medical history significant foratrial fibrillation on Eliquis, diastolic heart failure, dementia, HTN, PAD pacemaker placement, was brought to the emergency room following an accidental fall.:X-ray right hip: Displaced right femoral neck fracture s/p ORIF on 06/28/2020 with Dr. Odis LusterBowers.  Tolerated the procedure well.  On 06/26/2020 Foley catheter was attempted and during advancement it emerged out of the vagina, there was some concern of fistula, which will need outpatient evaluation.  Patient is not a candidate for any advanced surgeries. Per daughter patient had issues with Foley placement in the past.  Developed postoperative hyponatremia, not responding to IV fluid so she was transferred to ICU for hypertonic saline and nephrology was consulted. Transferred out of ICU as sodium improved with hypertonic saline.  Subjective: Patient was feeling little better when seen today.  Daughter at bedside.  She wants to get out of the bed.  She did ate her breakfast.  Assessment & Plan:   Principal Problem:   Closed displaced fracture of right femoral neck (HCC) Active Problems:   Alzheimer's dementia without behavioral disturbance (HCC)   Atrial fibrillation and flutter (HCC)   Benign essential HTN   PAD (peripheral artery disease) (HCC)   Status post placement of cardiac pacemaker   Chronic anticoagulation   Preoperative clearance   Distal radius fracture, right   Protein-calorie malnutrition, severe  Right femoral neck and distal right radius fracture secondary to mechanical fall.  S/p hemiarthroplasty and reduction of her right wrist. She was taken to the OR on 06/28/2020 as she was on Eliquis at home, waited few days for Eliquis washout. PT is recommending SNF placement. Patient with declining  health, worsening dementia and no recent hip fracture, currently full code.  High risk for deterioration and death. -Palliative care consult -Continue with physical therapy. -Eliquis was restarted postoperatively. -She will follow-up with orthopedic surgery as an outpatient on 3/7 or 8 for staple removal.  Hyponatremia.  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  Nephrology was consulted as she was transferred to ICU for hypertonic saline, sodium at 131 this morning. -Hypertonic saline was discontinued. -Continue to monitor  Atrial fibrillation and flutter.  Patient with RVR while in ED which resolved with one-time dose of IV diltiazem.  Currently heart rate controlled Her home dose of Eliquis was held initially and resumed postoperatively. 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 Concern of fistula per nursing staff as Foley catheter imaged out of vagina on progression.  Urology was consulted and they are recommending outpatient follow-up. According to urology recommendation Foley catheter should remain in for at least 1 week and voiding trial should be obtained after constipation resolved, constipation resolved today. In addition to the above, it sounds based on her history, that she may have some urethral atrophy versus kinking versus stenosis or perhaps that urethra is somewhat ectopic as a result in that the catheter may have just simply coiled in the vagina rather than the possibility of a vesicovaginal fistula. Either way, we will not plan for any further work-up of this at this time. No need for further imaging. -Remove Foley today and give her a voiding trial.  Alzheimer's dementia without behavioral disturbance (HCC) -Continue Haldol as needed behavioral disturbance -continue quetiapine  Essential HTN.  Blood pressure within goal. -Continue home dose of  losartan and  beta-blocker.  History of cardiac pacemaker.  No acute concern  Objective: Vitals:   07/02/20 1000 07/02/20 1100 07/02/20 1200 07/02/20 1300  BP: 108/78 120/72 (!) 128/98 129/66  Pulse: (!) 112  79 (!) 129  Resp: (!) 22 (!) 25 19 15   Temp:      TempSrc:      SpO2: 92%  95% 90%  Weight:      Height:        Intake/Output Summary (Last 24 hours) at 07/02/2020 1558 Last data filed at 07/02/2020 0346 Gross per 24 hour  Intake 381.97 ml  Output 850 ml  Net -468.03 ml   Filed Weights   06/24/20 2319 06/25/20 0411  Weight: 45.4 kg 44.6 kg    Examination:  General.  Frail elderly lady, in no acute distress. Pulmonary.  Lungs clear bilaterally, normal respiratory effort. CV.  Regular rate and rhythm, no JVD, rub or murmur. Abdomen.  Soft, nontender, nondistended, BS positive. CNS.  Alert and oriented x3.  No focal neurologic deficit. Extremities.  No edema, no cyanosis, pulses intact and symmetrical, right arm with Ace wrap. Psychiatry.  Judgment and insight appears normal.  DVT prophylaxis: Eliquis Code Status: Full Family Communication: Daughter was updated at bedside. Disposition Plan:  Status is: Inpatient  Remains inpatient appropriate because:Inpatient level of care appropriate due to severity of illness   Dispo: The patient is from: Home              Anticipated d/c is to: SNF              Anticipated d/c date is: 1 day.              Patient currently is medically stable.   Difficult to place patient No              Level of care: Med-Surg  All the records are reviewed and case discussed with Care Management/Social Worker. Management plans discussed with the patient, nursing and they are in agreement.  Consultants:   Nephrology  Orthopedic surgery  Procedures:  Antimicrobials:   Data Reviewed: I have personally reviewed following labs and imaging studies  CBC: Recent Labs  Lab 06/28/20 0431 06/29/20 0349 06/30/20 0449 07/01/20 0129  WBC 11.1*  12.6* 13.1* 12.4*  HGB 11.8* 11.0* 10.3* 10.3*  HCT 34.6* 31.7* 30.1* 30.1*  MCV 86.1 85.0 87.2 87.0  PLT 164 177 186 193   Basic Metabolic Panel: Recent Labs  Lab 06/28/20 0431 06/29/20 0349 06/30/20 0449 06/30/20 1609 07/01/20 0129 07/01/20 1053 07/01/20 1653 07/01/20 2200 07/02/20 0200 07/02/20 0535  NA 125* 124* 125*   < > 124* 129* 126* 128* 130* 131*  K 3.5 3.9 3.8  --  3.9  --   --   --   --   --   CL 89* 90* 90*  --  90*  --   --   --   --   --   CO2 27 26 29   --  29  --   --   --   --   --   GLUCOSE 130* 155* 199*  --  136*  --   --   --   --   --   BUN 30* 32* 35*  --  36*  --   --   --   --   --   CREATININE 0.66 0.63 0.81  --  0.69  --   --   --   --   --  CALCIUM 8.8* 8.4* 8.5*  --  8.1*  --   --   --   --   --    < > = values in this interval not displayed.   GFR: Estimated Creatinine Clearance: 32.2 mL/min (by C-G formula based on SCr of 0.69 mg/dL). Liver Function Tests: No results for input(s): AST, ALT, ALKPHOS, BILITOT, PROT, ALBUMIN in the last 168 hours. No results for input(s): LIPASE, AMYLASE in the last 168 hours. No results for input(s): AMMONIA in the last 168 hours. Coagulation Profile: No results for input(s): INR, PROTIME in the last 168 hours. Cardiac Enzymes: No results for input(s): CKTOTAL, CKMB, CKMBINDEX, TROPONINI in the last 168 hours. BNP (last 3 results) No results for input(s): PROBNP in the last 8760 hours. HbA1C: No results for input(s): HGBA1C in the last 72 hours. CBG: Recent Labs  Lab 06/26/20 0815 06/30/20 2053  GLUCAP 144* 152*   Lipid Profile: No results for input(s): CHOL, HDL, LDLCALC, TRIG, CHOLHDL, LDLDIRECT in the last 72 hours. Thyroid Function Tests: No results for input(s): TSH, T4TOTAL, FREET4, T3FREE, THYROIDAB in the last 72 hours. Anemia Panel: No results for input(s): VITAMINB12, FOLATE, FERRITIN, TIBC, IRON, RETICCTPCT in the last 72 hours. Sepsis Labs: No results for input(s): PROCALCITON,  LATICACIDVEN in the last 168 hours.  Recent Results (from the past 240 hour(s))  Resp Panel by RT-PCR (Flu A&B, Covid)     Status: None   Collection Time: 06/25/20  2:28 AM  Result Value Ref Range Status   SARS Coronavirus 2 by RT PCR NEGATIVE NEGATIVE Final    Comment: (NOTE) SARS-CoV-2 target nucleic acids are NOT DETECTED.  The SARS-CoV-2 RNA is generally detectable in upper respiratory specimens during the acute phase of infection. The lowest concentration of SARS-CoV-2 viral copies this assay can detect is 138 copies/mL. A negative result does not preclude SARS-Cov-2 infection and should not be used as the sole basis for treatment or other patient management decisions. A negative result may occur with  improper specimen collection/handling, submission of specimen other than nasopharyngeal swab, presence of viral mutation(s) within the areas targeted by this assay, and inadequate number of viral copies(<138 copies/mL). A negative result must be combined with clinical observations, patient history, and epidemiological information. The expected result is Negative.  Fact Sheet for Patients:  BloggerCourse.com  Fact Sheet for Healthcare Providers:  SeriousBroker.it  This test is no t yet approved or cleared by the [place] FDA and  has been authorized for detection and/or diagnosis of SARS-CoV-2 by FDA under an Emergency Use Authorization (EUA). This EUA will remain  in effect (meaning this test can be used) for the duration of the COVID-19 declaration under Section 564(b)(1) of the Act, 21 U.S.C.section 360bbb-3(b)(1), unless the authorization is terminated  or revoked sooner.       Influenza A by PCR NEGATIVE NEGATIVE Final   Influenza B by PCR NEGATIVE NEGATIVE Final    Comment: (NOTE) The Xpert Xpress SARS-CoV-2/FLU/RSV plus assay is intended as an aid in the diagnosis of influenza from Nasopharyngeal swab specimens  and should not be used as a sole basis for treatment. Nasal washings and aspirates are unacceptable for Xpert Xpress SARS-CoV-2/FLU/RSV testing.  Fact Sheet for Patients: BloggerCourse.com  Fact Sheet for Healthcare Providers: SeriousBroker.it  This test is not yet approved or cleared by the [place] FDA and has been authorized for detection and/or diagnosis of SARS-CoV-2 by FDA under an Emergency Use Authorization (EUA). This EUA will remain in effect (meaning  this test can be used) for the duration of the COVID-19 declaration under Section 564(b)(1) of the Act, 21 U.S.C. section 360bbb-3(b)(1), unless the authorization is terminated or revoked.  Performed at [facility], [address] [place], [place] [address]      Radiology Studies: No results found.  Scheduled Meds: . apixaban  2.5 mg Oral BID  . bisacodyl  10 mg Oral Daily  . Chlorhexidine Gluconate Cloth  6 each Topical Daily  . docusate sodium  100 mg Oral BID  . feeding supplement  237 mL Oral BID BM  . lidocaine  1 patch Transdermal Q24H  . losartan  25 mg Oral Daily  . metoprolol tartrate  25 mg Oral Q6H  . multivitamin with minerals  1 tablet Oral Daily  . pantoprazole  40 mg Oral Daily  . polyethylene glycol  17 g Oral BID  . QUEtiapine  25 mg Oral QHS  . senna-docusate  2 tablet Oral BID   Continuous Infusions: .  ceFAZolin (ANCEF) IV       LOS: 7 days   Time spent: 30 minutes. More than 50% of the time was spent in counseling/coordination of care  Arnetha Courser, MD [facility]  If 7PM-7AM, please contact night-coverage [email or web address]  07/02/2020, 3:58 PM   This record has been created using Conservation officer, historic buildings. Errors have been sought and corrected,but may not always be located. Such creation errors do not reflect on the standard of care.

## 2020-07-02 NOTE — Progress Notes (Signed)
MEDICATION RELATED CONSULT NOTE  Pharmacy Consult for Hypertonic saline/Sodium monitoring Indication: hyponatremia  Allergies  Allergen Reactions  . Lisinopril Other (See Comments)    Patient Measurements: Height: 5\' 6"  (167.6 cm) Weight: 44.6 kg (98 lb 5.2 oz) IBW/kg (Calculated) : 59.3  Vital Signs: Temp: 98.4 F (36.9 C) (02/23 1600) Temp Source: Oral (02/23 1600) BP: 153/87 (02/23 1800) Pulse Rate: 75 (02/23 1700) Intake/Output from previous day: 02/23 0701 - 02/24 0700 In: 1203.8 [P.O.:825; I.V.:378.8] Out: 485 [Urine:485] Intake/Output from this shift: Total I/O In: 261.5 [I.V.:261.5] Out: 225 [Urine:225]  Labs: Recent Labs    06/29/20 0349 06/30/20 0449 07/01/20 0129  WBC 12.6* 13.1* 12.4*  HGB 11.0* 10.3* 10.3*  HCT 31.7* 30.1* 30.1*  PLT 177 186 193  CREATININE 0.63 0.81 0.69   Estimated Creatinine Clearance: 32.2 mL/min (by C-G formula based on SCr of 0.69 mg/dL).   Microbiology: Recent Results (from the past 720 hour(s))  Resp Panel by RT-PCR (Flu A&B, Covid)     Status: None   Collection Time: 06/25/20  2:28 AM  Result Value Ref Range Status   SARS Coronavirus 2 by RT PCR NEGATIVE NEGATIVE Final    Comment: (NOTE) SARS-CoV-2 target nucleic acids are NOT DETECTED.  The SARS-CoV-2 RNA is generally detectable in upper respiratory specimens during the acute phase of infection. The lowest concentration of SARS-CoV-2 viral copies this assay can detect is 138 copies/mL. A negative result does not preclude SARS-Cov-2 infection and should not be used as the sole basis for treatment or other patient management decisions. A negative result may occur with  improper specimen collection/handling, submission of specimen other than nasopharyngeal swab, presence of viral mutation(s) within the areas targeted by this assay, and inadequate number of viral copies(<138 copies/mL). A negative result must be combined with clinical observations, patient history,  and epidemiological information. The expected result is Negative.  Fact Sheet for Patients:  06/27/20  Fact Sheet for Healthcare Providers:  BloggerCourse.com  This test is no t yet approved or cleared by the [place] FDA and  has been authorized for detection and/or diagnosis of SARS-CoV-2 by FDA under an Emergency Use Authorization (EUA). This EUA will remain  in effect (meaning this test can be used) for the duration of the COVID-19 declaration under Section 564(b)(1) of the Act, 21 U.S.C.section 360bbb-3(b)(1), unless the authorization is terminated  or revoked sooner.       Influenza A by PCR NEGATIVE NEGATIVE Final   Influenza B by PCR NEGATIVE NEGATIVE Final    Comment: (NOTE) The Xpert Xpress SARS-CoV-2/FLU/RSV plus assay is intended as an aid in the diagnosis of influenza from Nasopharyngeal swab specimens and should not be used as a sole basis for treatment. Nasal washings and aspirates are unacceptable for Xpert Xpress SARS-CoV-2/FLU/RSV testing.  Fact Sheet for Patients: [place]  Fact Sheet for Healthcare Providers: BloggerCourse.com  This test is not yet approved or cleared by the [place]ious[place] FDA and has been authorized for detection and/or diagnosis of SARS-CoV-2 by FDA under an Emergency Use Authorization (EUA). This EUA will remain in effect (meaning this test can be used) for the duration of the COVID-19 declaration under Section 564(b)(1) of the Act, 21 U.S.C. section 360bbb-3(b)(1), unless the authorization is terminated or revoked.  Performed at [facility], [address], [place], [place]     Medical History: Past Medical History:  Diagnosis Date  . Atrial fibrillation (HCC)   . CHF (congestive heart failure) (HCC)   .  Dementia (HCC)     Medications:  Scheduled:  . apixaban  2.5 mg Oral BID  .  bisacodyl  10 mg Oral Daily  . Chlorhexidine Gluconate Cloth  6 each Topical Daily  . docusate sodium  100 mg Oral BID  . feeding supplement  237 mL Oral BID BM  . lidocaine  1 patch Transdermal Q24H  . losartan  25 mg Oral Daily  . metoprolol tartrate  25 mg Oral Q6H  . multivitamin with minerals  1 tablet Oral Daily  . pantoprazole  40 mg Oral Daily  . polyethylene glycol  17 g Oral BID  . QUEtiapine  25 mg Oral QHS  . senna-docusate  2 tablet Oral BID   Infusions:  .  ceFAZolin (ANCEF) IV    . sodium chloride (hypertonic) 30 mL/hr at 07/02/20 0300    Assessment: 87 yo F to start Hypertonic 3% saline drip for hyponatremia likely due to postop SIADH with nephrology following. The most recent sodium is 129 (baseline 135)  Goal of Therapy:  sodium WNL  2/24 0200 Na+ 130   Plan:    Target goal of 130 -132 by this AM  Hypertonic saline currently running at 30 ml/hr  Next Na+ level scheduled for 0600  If sodium rises > 4 mEq/L over 2 hours or > 6 mEq/L over 4 hours - Pharmacy will evaluate and contact MD per consult.   Otelia Sergeant, PharmD, [facility] 07/02/2020 3:21 AM

## 2020-07-02 NOTE — Progress Notes (Signed)
Patient lethargic and sleeping. Patient not cooperating with neuro assessment. Will continue to assess what I can. Will continue care plan.

## 2020-07-02 NOTE — Evaluation (Signed)
Physical Therapy Re-Evaluation Patient Details Name: Kristen Osborn MRN: [id number] DOB: 12-05-1926 Today's Date: 07/02/2020   History of Present Illness  Pt is a 87 y.o. female presenting to hospital 2/16 s/p fall at home c/o R hip and R hand pain.  Pt admitted with closed displaced fx of R femoral neck, distal radius fx R, a-fib and a-flutter with RVR in ER; pt also noted with urinary retention.  Pt s/p 2/16 R hip anterior hip hemiarthroplasty and closed reduction and splinting of R distal radius.  Transferred 2/22 to CCU d/t post op hyponatremia.  PMH includes dementia, a-fib on Eliquis, CHF s/p pacemaker, PAD.  Clinical Impression  New PT consult received today (after pt transferred to CCU 2/22); PT re-evaluation performed.  Pt sitting in recliner with pt's daughter present upon PT arrival.  Min assist x2 for transfers and ambulation 16 feet using R platform RW.  Pt requiring fairly consistent vc's, demo, and hand over hand assist for activities (pt appearing confused and with difficulty following 1 step cues consistently).  Assist required to maintain R UE WB'ing precautions with activities.  PT POC reviewed and remains appropriate.  Will continue to focus on strengthening and progressive functional mobility per pt tolerance.     Follow Up Recommendations SNF    Equipment Recommendations  Wheelchair (measurements PT);Wheelchair cushion (measurements PT)    Recommendations for Other Services OT consult     Precautions / Restrictions Precautions Precautions: Fall;Anterior Hip Precaution Booklet Issued: Yes (comment) Restrictions Weight Bearing Restrictions: Yes RUE Weight Bearing: Weight bear through elbow only RLE Weight Bearing: Weight bearing as tolerated      Mobility  Bed Mobility         General bed mobility comments: Deferred (pt in recliner beginning/end of session)    Transfers Overall transfer level: Needs assistance Equipment used: Right platform  walker Transfers: Sit to/from Stand Sit to Stand: Min assist;+2 physical assistance       General transfer comment: vc's, visual demo, and hand over hand assist for UE positioning and to maintain R UE precautions  Ambulation/Gait Ambulation/Gait assistance: Min assist;+2 physical assistance (chair follow) Gait Distance (Feet): 16 Feet Assistive device: Rolling walker (2 wheeled)   Gait velocity: decreased   General Gait Details: very narrow BOS; mildly antalgic (increased antalgic gait with increased distance ambulating); decreased stance time R LE; assist and vc's for elbow WB'ing only R UE  Stairs            Wheelchair Mobility    Modified Rankin (Stroke Patients Only)       Balance Overall balance assessment: Needs assistance Sitting-balance support: No upper extremity supported;Feet supported Sitting balance-Leahy Scale: Fair Sitting balance - Comments: steady static sitting   Standing balance support: Bilateral upper extremity supported (R platform RW) Standing balance-Leahy Scale: Poor Standing balance comment: pt initially with posterior lean in standing requiring vc's and assist for balance and repositioning                             Pertinent Vitals/Pain Pain Assessment: Faces Faces Pain Scale: Hurts a little bit Pain Location: R hip/thigh Pain Descriptors / Indicators: Grimacing;Operative site guarding Pain Intervention(s): Limited activity within patient's tolerance;Monitored during session;Repositioned  Vitals (HR and O2 on room air) stable and WFL throughout treatment session.    Home Living Family/patient expects to be discharged to:: Private residence Living Arrangements: Children Available Help at Discharge: Family;Available 24 hours/day Type of Home:  House Home Access: Stairs to enter Entrance Stairs-Rails: Right Entrance Stairs-Number of Steps: 2 steps with R railing into home (then has 3 steps with R railing to kitchen but can  stay on one level) Home Layout: One level Home Equipment: Walker - 2 wheels;Walker - standard;Bedside commode;Shower seat - built in;Grab bars - tub/shower      Prior Function Level of Independence: Needs assistance   Gait / Transfers Assistance Needed: Independent with ambulation; no other recent falls     Comments: 3 daughters take turns providing 24/7 assist     Hand Dominance   Dominant Hand: Right    Extremity/Trunk Assessment   Upper Extremity Assessment RUE Deficits / Details: in splint with ace wrap bandage; thumb bruised LUE Deficits / Details: generalized weakness    Lower Extremity Assessment Lower Extremity Assessment: Difficult to assess due to impaired cognition    Cervical / Trunk Assessment Cervical / Trunk Assessment: Normal  Communication   Communication: No difficulties  Cognition Arousal/Alertness: Awake/alert Behavior During Therapy: WFL for tasks assessed/performed Overall Cognitive Status: History of cognitive impairments - at baseline                                 General Comments: A&O x1      General Comments   Nursing cleared pt for participation in physical therapy.  Pt and pt's daughter agreeable to PT session.    Exercises Gait training   Assessment/Plan    PT Assessment Patient needs continued PT services  PT Problem List Decreased strength;Decreased activity tolerance;Decreased balance;Decreased mobility;Decreased knowledge of use of DME;Decreased knowledge of precautions;Pain;Decreased skin integrity       PT Treatment Interventions DME instruction;Gait training;Stair training;Functional mobility training;Therapeutic activities;Therapeutic exercise;Balance training;Patient/family education    PT Goals (Current goals can be found in the Care Plan section)  Acute Rehab PT Goals Patient Stated Goal: to improve mobility PT Goal Formulation: With patient Time For Goal Achievement: 07/13/20 Potential to Achieve  Goals: Fair    Frequency 7X/week   Barriers to discharge Decreased caregiver support Level of physical assist    Co-evaluation               AM-PAC PT "6 Clicks" Mobility  Outcome Measure Help needed turning from your back to your side while in a flat bed without using bedrails?: A Little Help needed moving from lying on your back to sitting on the side of a flat bed without using bedrails?: A Lot Help needed moving to and from a bed to a chair (including a wheelchair)?: A Lot Help needed standing up from a chair using your arms (e.g., wheelchair or bedside chair)?: A Lot Help needed to walk in hospital room?: Total Help needed climbing 3-5 steps with a railing? : Total 6 Click Score: 11    End of Session Equipment Utilized During Treatment: Gait belt Activity Tolerance: Patient limited by fatigue Patient left: in chair;with call bell/phone within reach;with family/visitor present;with SCD's reapplied;Other (comment) (R UE elevated via 2 pillows; B heels floating via pillow support; pt's daughter present (family present 24/7)) Nurse Communication: Mobility status;Precautions;Weight bearing status PT Visit Diagnosis: Other abnormalities of gait and mobility (R26.89);Muscle weakness (generalized) (M62.81);History of falling (Z91.81);Difficulty in walking, not elsewhere classified (R26.2);Pain Pain - Right/Left: Right Pain - part of body: Hip    Time: 2671-2458 PT Time Calculation (min) (ACUTE ONLY): 29 min   Charges:   PT Evaluation $PT Re-evaluation: 1  Re-eval PT Treatments $Gait Training: 8-22 mins       Hendricks Limes, PT 07/02/20, 2:02 PM

## 2020-07-02 NOTE — Progress Notes (Signed)
[facility], [place] 07/02/20  Subjective:   LOS: 7  Kristen Osborn is a 87 y.o. female with past medical history of Atrial Fibrillation, CHF and Dementia. She presents to the ED after a fall. She has underwent a right hip anterior hemiarthroplasty. We were consulted for hyponatremia.  Patient is seen today resting comfortable in bed. Poor appetite Sodium improved with 3% saline   Objective:  Vital signs in last 24 hours:  Temp:  [97.9 F (36.6 C)-98.4 F (36.9 C)] 97.9 F (36.6 C) (02/24 0800) Pulse Rate:  [70-129] 129 (02/24 1300) Resp:  [15-27] 15 (02/24 1300) BP: (108-153)/(60-103) 129/66 (02/24 1300) SpO2:  [90 %-100 %] 90 % (02/24 1300)  Weight change:  Filed Weights   06/24/20 2319 06/25/20 0411  Weight: 45.4 kg 44.6 kg    Intake/Output:    Intake/Output Summary (Last 24 hours) at 07/02/2020 1517 Last data filed at 07/02/2020 0346 Gross per 24 hour  Intake 381.97 ml  Output 850 ml  Net -468.03 ml   Physical Exam: General:  No acute distress, laying in the bed, frail, elderly  HEENT  anicteric, moist oral mucous membrane  Pulm/lungs  normal breathing effort, lungs are clear  CVS/Heart  irregular rhythm, no rub or gallop  Abdomen:   Soft, nontender  Extremities:  No peripheral edema  Neurologic:   Alert and able to answer simple questions  Skin:  No acute rashes    Basic Metabolic Panel:  Recent Labs  Lab 06/28/20 0431 06/29/20 0349 06/30/20 0449 06/30/20 1609 07/01/20 0129 07/01/20 1053 07/01/20 1653 07/01/20 2200 07/02/20 0200 07/02/20 0535  NA 125* 124* 125*   < > 124* 129* 126* 128* 130* 131*  K 3.5 3.9 3.8  --  3.9  --   --   --   --   --   CL 89* 90* 90*  --  90*  --   --   --   --   --   CO2 27 26 29   --  29  --   --   --   --   --   GLUCOSE 130* 155* 199*  --  136*  --   --   --   --   --   BUN 30* 32* 35*  --  36*  --   --   --   --   --   CREATININE 0.66 0.63 0.81  --  0.69  --   --   --   --   --    CALCIUM 8.8* 8.4* 8.5*  --  8.1*  --   --   --   --   --    < > = values in this interval not displayed.     CBC: Recent Labs  Lab 06/28/20 0431 06/29/20 0349 06/30/20 0449 07/01/20 0129  WBC 11.1* 12.6* 13.1* 12.4*  HGB 11.8* 11.0* 10.3* 10.3*  HCT 34.6* 31.7* 30.1* 30.1*  MCV 86.1 85.0 87.2 87.0  PLT 164 177 186 193     No results found for: HEPBSAG, HEPBSAB, HEPBIGM    Microbiology:  Recent Results (from the past 240 hour(s))  Resp Panel by RT-PCR (Flu A&B, Covid)     Status: None   Collection Time: 06/25/20  2:28 AM  Result Value Ref Range Status   SARS Coronavirus 2 by RT PCR NEGATIVE NEGATIVE Final    Comment: (NOTE) SARS-CoV-2 target nucleic acids are NOT DETECTED.  The SARS-CoV-2 RNA is generally detectable  in upper respiratory specimens during the acute phase of infection. The lowest concentration of SARS-CoV-2 viral copies this assay can detect is 138 copies/mL. A negative result does not preclude SARS-Cov-2 infection and should not be used as the sole basis for treatment or other patient management decisions. A negative result may occur with  improper specimen collection/handling, submission of specimen other than nasopharyngeal swab, presence of viral mutation(s) within the areas targeted by this assay, and inadequate number of viral copies(<138 copies/mL). A negative result must be combined with clinical observations, patient history, and epidemiological information. The expected result is Negative.  Fact Sheet for Patients:  BloggerCourse.com  Fact Sheet for Healthcare Providers:  SeriousBroker.it  This test is no t yet approved or cleared by the [place] FDA and  has been authorized for detection and/or diagnosis of SARS-CoV-2 by FDA under an Emergency Use Authorization (EUA). This EUA will remain  in effect (meaning this test can be used) for the duration of the COVID-19 declaration under  Section 564(b)(1) of the Act, 21 U.S.C.section 360bbb-3(b)(1), unless the authorization is terminated  or revoked sooner.       Influenza A by PCR NEGATIVE NEGATIVE Final   Influenza B by PCR NEGATIVE NEGATIVE Final    Comment: (NOTE) The Xpert Xpress SARS-CoV-2/FLU/RSV plus assay is intended as an aid in the diagnosis of influenza from Nasopharyngeal swab specimens and should not be used as a sole basis for treatment. Nasal washings and aspirates are unacceptable for Xpert Xpress SARS-CoV-2/FLU/RSV testing.  Fact Sheet for Patients: BloggerCourse.com  Fact Sheet for Healthcare Providers: SeriousBroker.it  This test is not yet approved or cleared by the [place] FDA and has been authorized for detection and/or diagnosis of SARS-CoV-2 by FDA under an Emergency Use Authorization (EUA). This EUA will remain in effect (meaning this test can be used) for the duration of the COVID-19 declaration under Section 564(b)(1) of the Act, 21 U.S.C. section 360bbb-3(b)(1), unless the authorization is terminated or revoked.  Performed at [facility], [address] [place], [place] [address]     Coagulation Studies: No results for input(s): LABPROT, INR in the last 72 hours.  Urinalysis: No results for input(s): COLORURINE, LABSPEC, PHURINE, GLUCOSEU, HGBUR, BILIRUBINUR, KETONESUR, PROTEINUR, UROBILINOGEN, NITRITE, LEUKOCYTESUR in the last 72 hours.  Invalid input(s): APPERANCEUR    Imaging: No results found.   Medications:   .  ceFAZolin (ANCEF) IV     . apixaban  2.5 mg Oral BID  . bisacodyl  10 mg Oral Daily  . Chlorhexidine Gluconate Cloth  6 each Topical Daily  . docusate sodium  100 mg Oral BID  . feeding supplement  237 mL Oral BID BM  . lidocaine  1 patch Transdermal Q24H  . losartan  25 mg Oral Daily  . metoprolol tartrate  25 mg Oral Q6H  . multivitamin with minerals  1 tablet Oral Daily  .  pantoprazole  40 mg Oral Daily  . polyethylene glycol  17 g Oral BID  . QUEtiapine  25 mg Oral QHS  . senna-docusate  2 tablet Oral BID   hydrALAZINE, HYDROcodone-acetaminophen, metoCLOPramide **OR** metoCLOPramide (REGLAN) injection, morphine injection, ondansetron **OR** ondansetron (ZOFRAN) IV  Assessment/ Plan:  87 y.o. female with atrial fibrillation requiring anticoagulation with Eliquis, diastolic CHF, dementia, hypertension, pacemaker, was admitted on 06/24/2020 for  Principal Problem:   Closed displaced fracture of right femoral neck (HCC) Active Problems:   Alzheimer's dementia without behavioral disturbance (HCC)   Atrial fibrillation and flutter (HCC)  Benign essential HTN   PAD (peripheral artery disease) (HCC)   Status post placement of cardiac pacemaker   Chronic anticoagulation   Preoperative clearance   Distal radius fracture, right   Protein-calorie malnutrition, severe  Closed right hip fracture (HCC) [S72.001A] Fall, initial encounter [W19.XXXA] Closed displaced fracture of right femoral neck (HCC) [S72.001A] Closed fracture of distal end of right radius, unspecified fracture morphology, initial encounter [S52.501A]  #. Hyponatremia  -likely due to postop SIADH -Baseline Sodium- 135 (Jun 25, 2020) -Na improved to 131 today with 3% hypertonic saline - monitor for now.     LOS: 7 Kristen Osborn 2/24/20223:17 PM  [facility] [place], [place] [phone]

## 2020-07-02 NOTE — Evaluation (Signed)
Occupational Therapy Re-Evaluation Patient Details Name: Kristen Osborn MRN: [id number] DOB: 07/11/1926 Today's Date: 07/02/2020    History of Present Illness Pt is a 87 y.o. female presenting to hospital 2/16 s/p fall at home c/o R hip and R hand pain.  Pt admitted with closed displaced fx of R femoral neck, distal radius fx R, a-fib and a-flutter with RVR in ER; pt also noted with urinary retention.  Pt s/p 2/16 R hip anterior hip hemiarthroplasty and closed reduction and splinting of R distal radius.  PMH includes dementia, a-fib on Eliquis, CHF s/p pacemaker, PAD.   Clinical Impression   Kristen Osborn was seen for OT re-evaluation on this date following CCU t/f. Upon arrival to room pt reclined in bed with daughter at bedside - agreeable to tx. MOD A exit L side of bed. MAX A don/doff B socks seated EOB. MIN A x2 + R platform walker sit<>stand x2 trials - assist from daughter to manage RUE and maintain WBAT through elbow only. Pt and family instructed in updated WBing status. MOD A for SPT bed>chair. CGA self-drinking seated EOC. Pt making good progress toward goals including meeting 2/3 goals - goals updated to reflect pt progress. Pt continues to benefit from skilled OT services to maximize return to PLOF and minimize risk of future falls, injury, caregiver burden, and readmission. Will continue to follow POC. Discharge recommendation remains appropriate.       Follow Up Recommendations  SNF    Equipment Recommendations  None recommended by OT    Recommendations for Other Services       Precautions / Restrictions Precautions Precautions: Fall;Anterior Hip Restrictions Weight Bearing Restrictions: Yes RUE Weight Bearing: Weight bear through elbow only RLE Weight Bearing: Weight bearing as tolerated      Mobility Bed Mobility Overal bed mobility: Needs Assistance Bed Mobility: Supine to Sit     Supine to sit: Mod assist;HOB elevated          Transfers Overall transfer  level: Needs assistance Equipment used: Right platform walker Transfers: Sit to/from Stand;Stand Pivot Transfers Sit to Stand: Min assist;+2 physical assistance Stand pivot transfers: Mod assist       General transfer comment: small steps in place    Balance Overall balance assessment: Needs assistance Sitting-balance support: Single extremity supported;Feet supported Sitting balance-Leahy Scale: Fair Sitting balance - Comments: CGA   Standing balance support: Bilateral upper extremity supported (R platform walker) Standing balance-Leahy Scale: Fair                             ADL either performed or assessed with clinical judgement   ADL Overall ADL's : Needs assistance/impaired                                       General ADL Comments: MAX A don/doff B socks at bed level. MOD A for ADL t/f. CGA self-drinking seated EOC.                  Pertinent Vitals/Pain Pain Assessment: Faces Faces Pain Scale: Hurts a little bit Pain Location: R hip/thigh Pain Descriptors / Indicators: Grimacing;Operative site guarding Pain Intervention(s): Limited activity within patient's tolerance;Repositioned              Cognition Arousal/Alertness: Awake/alert Behavior During Therapy: WFL for tasks assessed/performed Overall Cognitive Status: History of cognitive impairments -  at baseline                                 General Comments: A&Ox1 - self only (intermittenly able to recognize daughter at bedside)   General Comments       Exercises Exercises: Other exercises Other Exercises Other Exercises: Pt and family educated re: OT role, DME recs, d/c recs, falls prevention, edema mgmt, WBing pcns Other Exercises: LBD, UBD, sup>sit, sit<>stand, sitting/standing blaance/tolerance, SPT                      OT Goals(Current goals can be found in the care plan section) Acute Rehab OT Goals Patient Stated Goal: to improve  mobility OT Goal Formulation: With patient/family Time For Goal Achievement: 07/16/20 Potential to Achieve Goals: Fair ADL Goals Pt Will Perform Eating: with modified independence;sitting Pt Will Perform Upper Body Dressing: sitting;with supervision;with caregiver independent in assisting Pt Will Transfer to Toilet: with min assist;ambulating;bedside commode  OT Frequency: Min 2X/week    AM-PAC OT "6 Clicks" Daily Activity     Outcome Measure Help from another person eating meals?: A Little Help from another person taking care of personal grooming?: A Little Help from another person toileting, which includes using toliet, bedpan, or urinal?: A Lot Help from another person bathing (including washing, rinsing, drying)?: A Lot Help from another person to put on and taking off regular upper body clothing?: A Lot Help from another person to put on and taking off regular lower body clothing?: A Lot 6 Click Score: 14   End of Session Equipment Utilized During Treatment: Engineer, water Communication: Mobility status  Activity Tolerance: Patient tolerated treatment well Patient left: with call bell/phone within reach;in chair;with family/visitor present  OT Visit Diagnosis: Unsteadiness on feet (R26.81);Muscle weakness (generalized) (M62.81)                Time: 1020-1050 OT Time Calculation (min): 30 min Charges:  OT General Charges $OT Visit: 1 Visit OT Evaluation $OT Re-eval: 1 Re-eval OT Treatments $Self Care/Home Management : 23-37 mins  Kathie Dike, M.S. OTR/L  07/02/20, 1:25 PM  ascom [phone]

## 2020-07-03 DIAGNOSIS — S72001A Fracture of unspecified part of neck of right femur, initial encounter for closed fracture: Secondary | ICD-10-CM | POA: Diagnosis not present

## 2020-07-03 DIAGNOSIS — G309 Alzheimer's disease, unspecified: Secondary | ICD-10-CM | POA: Diagnosis not present

## 2020-07-03 DIAGNOSIS — Z66 Do not resuscitate: Secondary | ICD-10-CM | POA: Diagnosis not present

## 2020-07-03 DIAGNOSIS — Z7189 Other specified counseling: Secondary | ICD-10-CM | POA: Diagnosis not present

## 2020-07-03 LAB — CBC
HCT: 27 % — ABNORMAL LOW (ref 36.0–46.0)
Hemoglobin: 9.3 g/dL — ABNORMAL LOW (ref 12.0–15.0)
MCH: 29.9 pg (ref 26.0–34.0)
MCHC: 34.4 g/dL (ref 30.0–36.0)
MCV: 86.8 fL (ref 80.0–100.0)
Platelets: 287 10*3/uL (ref 150–400)
RBC: 3.11 MIL/uL — ABNORMAL LOW (ref 3.87–5.11)
RDW: 13.6 % (ref 11.5–15.5)
WBC: 9.7 10*3/uL (ref 4.0–10.5)
nRBC: 0 % (ref 0.0–0.2)

## 2020-07-03 LAB — BASIC METABOLIC PANEL
Anion gap: 8 (ref 5–15)
BUN: 34 mg/dL — ABNORMAL HIGH (ref 8–23)
CO2: 26 mmol/L (ref 22–32)
Calcium: 8.3 mg/dL — ABNORMAL LOW (ref 8.9–10.3)
Chloride: 97 mmol/L — ABNORMAL LOW (ref 98–111)
Creatinine, Ser: 0.62 mg/dL (ref 0.44–1.00)
GFR, Estimated: >60 mL/min (ref >60)
Glucose, Bld: 119 mg/dL — ABNORMAL HIGH (ref 70–99)
Potassium: 4.1 mmol/L (ref 3.5–5.1)
Sodium: 131 mmol/L — ABNORMAL LOW (ref 135–145)

## 2020-07-03 LAB — RETICULOCYTES
Immature Retic Fract: 11.2 % (ref 2.3–15.9)
RBC.: 3.1 MIL/uL — ABNORMAL LOW (ref 3.87–5.11)
Retic Count, Absolute: 101.4 10*3/uL (ref 19.0–186.0)
Retic Ct Pct: 3.3 % — ABNORMAL HIGH (ref 0.4–3.1)

## 2020-07-03 LAB — IRON AND TIBC
Iron: 29 ug/dL (ref 28–170)
Saturation Ratios: 17 % (ref 10.4–31.8)
TIBC: 171 ug/dL — ABNORMAL LOW (ref 250–450)
UIBC: 142 ug/dL

## 2020-07-03 LAB — FOLATE: Folate: 11.5 ng/mL (ref >5.9)

## 2020-07-03 LAB — FERRITIN: Ferritin: 473 ng/mL — ABNORMAL HIGH (ref 11–307)

## 2020-07-03 LAB — VITAMIN B12: Vitamin B-12: 2816 pg/mL — ABNORMAL HIGH (ref 180–914)

## 2020-07-03 LAB — SODIUM: Sodium: 131 mmol/L — ABNORMAL LOW (ref 135–145)

## 2020-07-03 MED ORDER — ENSURE ENLIVE PO LIQD
237.0000 mL | Freq: Three times a day (TID) | ORAL | Status: DC
  Administered 2020-07-03 – 2020-07-06 (×9): 237 mL via ORAL

## 2020-07-03 NOTE — Progress Notes (Signed)
Physical Therapy Treatment Patient Details Name: Kristen Osborn MRN: [id number] DOB: 11-15-1926 Today's Date: 07/03/2020    History of Present Illness Pt is a 87 y.o. female presenting to hospital 2/16 s/p fall at home c/o R hip and R hand pain.  Pt admitted with closed displaced fx of R femoral neck, distal radius fx R, a-fib and a-flutter with RVR in ER; pt also noted with urinary retention.  Pt s/p 2/16 R hip anterior hip hemiarthroplasty and closed reduction and splinting of R distal radius.  Transferred 2/22 to CCU d/t post op hyponatremia.  PMH includes dementia, a-fib on Eliquis, CHF s/p pacemaker, PAD.    PT Comments    Pt was long sitting in bed awake with supportive daughter at bedside. She is pleasantly confused. Only oriented to self. Was able to exit R side of bed with increased time, max vcs, and Mod assist. Sat EOB x several minutes prior to standing and ambulating with platform walker. Pt has very narrow BOS requiring min assist to propel RW fwd + with turning. Pt does endorse fatigue but overall tolerated session well. Acute PT will continue to progress pt as able per POC. Recommend DC to SNF to address deficits while assisting pt to PLOF.    Follow Up Recommendations  SNF     Equipment Recommendations  Wheelchair (measurements PT);Wheelchair cushion (measurements PT);Other (comment) (pt has platform walker)    Recommendations for Other Services       Precautions / Restrictions Precautions Precautions: Fall;Anterior Hip Precaution Booklet Issued: Yes (comment) Restrictions Weight Bearing Restrictions: Yes RUE Weight Bearing: Non weight bearing RLE Weight Bearing: Weight bearing as tolerated    Mobility  Bed Mobility Overal bed mobility: Needs Assistance Bed Mobility: Supine to Sit     Supine to sit: Mod assist;HOB elevated Sit to supine: Max assist;HOB elevated        Transfers Overall transfer level: Needs assistance Equipment used: Right platform  walker Transfers: Sit to/from Stand Sit to Stand: Min assist;Mod assist            Ambulation/Gait Ambulation/Gait assistance: Min Chemical engineer (Feet): 30 Feet Assistive device: Rolling walker (2 wheeled) Gait Pattern/deviations: Step-to pattern;Antalgic;Trunk flexed Gait velocity: decreased   General Gait Details: very narrow BOS; mildly antalgic (increased antalgic gait with increased distance ambulating); decreased stance time R LE; assist and vc's for elbow WB'ing only R UE       Balance Overall balance assessment: Needs assistance Sitting-balance support: No upper extremity supported;Feet supported Sitting balance-Leahy Scale: Fair Sitting balance - Comments: steady static sitting   Standing balance support: Bilateral upper extremity supported Standing balance-Leahy Scale: Fair Standing balance comment: Is a high fall risk due to cognition         Cognition Arousal/Alertness: Awake/alert Behavior During Therapy: WFL for tasks assessed/performed Overall Cognitive Status: History of cognitive impairments - at baseline      General Comments: oriented to self only however was able to follow simple one step commands at times but is inconsistent             Pertinent Vitals/Pain Pain Assessment: Faces Faces Pain Scale: Hurts little more Pain Location: R hip/thigh Pain Descriptors / Indicators: Grimacing;Operative site guarding Pain Intervention(s): Limited activity within patient's tolerance;Monitored during session;Premedicated before session;Repositioned           PT Goals (current goals can now be found in the care plan section) Acute Rehab PT Goals Patient Stated Goal: to improve mobility Progress towards PT goals:  Progressing toward goals    Frequency    7X/week      PT Plan Current plan remains appropriate       AM-PAC PT "6 Clicks" Mobility   Outcome Measure  Help needed turning from your back to your side while in a flat bed  without using bedrails?: A Little Help needed moving from lying on your back to sitting on the side of a flat bed without using bedrails?: A Lot Help needed moving to and from a bed to a chair (including a wheelchair)?: A Lot Help needed standing up from a chair using your arms (e.g., wheelchair or bedside chair)?: A Lot Help needed to walk in hospital room?: A Little Help needed climbing 3-5 steps with a railing? : A Lot 6 Click Score: 14    End of Session Equipment Utilized During Treatment: Gait belt Activity Tolerance: Patient tolerated treatment well;Patient limited by fatigue Patient left: in bed;with call bell/phone within reach;with bed alarm set;with family/visitor present Nurse Communication: Mobility status;Precautions;Weight bearing status PT Visit Diagnosis: Other abnormalities of gait and mobility (R26.89);Muscle weakness (generalized) (M62.81);History of falling (Z91.81);Difficulty in walking, not elsewhere classified (R26.2);Pain Pain - Right/Left: Right Pain - part of body: Hip     Time: 1430-1500 PT Time Calculation (min) (ACUTE ONLY): 30 min  Charges:  $Gait Training: 8-22 mins $Therapeutic Activity: 8-22 mins                     Jetta Lout PTA 07/03/20, 3:16 PM

## 2020-07-03 NOTE — Progress Notes (Signed)
[facility] [place], [place] 07/03/20  Subjective:   LOS: 8  Kristen Osborn is a 87 y.o. female with past medical history of Atrial Fibrillation, CHF and Dementia. She presents to the ED after a fall. She has underwent a right hip anterior hemiarthroplasty. We were consulted for hyponatremia.  Patient is seen today resting comfortable in bed. Daughter at bedside Continues to have poor appetite   Objective:  Vital signs in last 24 hours:  Temp:  [97.9 F (36.6 C)-98.9 F (37.2 C)] 98.2 F (36.8 C) (02/25 1112) Pulse Rate:  [45-129] 88 (02/25 1112) Resp:  [15-18] 17 (02/25 1112) BP: (125-158)/(60-96) 146/65 (02/25 1112) SpO2:  [90 %-99 %] 99 % (02/25 1112)  Weight change:  Filed Weights   06/24/20 2319 06/25/20 0411  Weight: 45.4 kg 44.6 kg    Intake/Output:    Intake/Output Summary (Last 24 hours) at 07/03/2020 1247 Last data filed at 07/03/2020 1125 Gross per 24 hour  Intake -  Output 1100 ml  Net -1100 ml   Physical Exam: General:  No acute distress, laying in the bed, frail, elderly  HEENT  anicteric, moist oral mucous membrane  Pulm/lungs  normal breathing effort, lungs are clear  CVS/Heart  irregular rhythm, no rub or gallop  Abdomen:   Soft, nontender  Extremities:  No peripheral edema, Rt arm splint  Neurologic:   Alert and able to answer simple questions  Skin:  No acute rashes    Basic Metabolic Panel:  Recent Labs  Lab 06/28/20 0431 06/29/20 0349 06/30/20 0449 06/30/20 1609 07/01/20 0129 07/01/20 1053 07/01/20 2200 07/02/20 0200 07/02/20 0535 07/02/20 2356 07/03/20 0654  NA 125* 124* 125*   < > 124*   < > 128* 130* 131* 131* 131*  K 3.5 3.9 3.8  --  3.9  --   --   --   --   --  4.1  CL 89* 90* 90*  --  90*  --   --   --   --   --  97*  CO2 27 26 29   --  29  --   --   --   --   --  26  GLUCOSE 130* 155* 199*  --  136*  --   --   --   --   --  119*  BUN 30* 32* 35*  --  36*  --   --   --   --   --  34*  CREATININE 0.66  0.63 0.81  --  0.69  --   --   --   --   --  0.62  CALCIUM 8.8* 8.4* 8.5*  --  8.1*  --   --   --   --   --  8.3*   < > = values in this interval not displayed.     CBC: Recent Labs  Lab 06/28/20 0431 06/29/20 0349 06/30/20 0449 07/01/20 0129 07/03/20 0654  WBC 11.1* 12.6* 13.1* 12.4* 9.7  HGB 11.8* 11.0* 10.3* 10.3* 9.3*  HCT 34.6* 31.7* 30.1* 30.1* 27.0*  MCV 86.1 85.0 87.2 87.0 86.8  PLT 164 177 186 193 287     No results found for: HEPBSAG, HEPBSAB, HEPBIGM    Microbiology:  Recent Results (from the past 240 hour(s))  Resp Panel by RT-PCR (Flu A&B, Covid)     Status: None   Collection Time: 06/25/20  2:28 AM  Result Value Ref Range Status   SARS Coronavirus 2 by RT  PCR NEGATIVE NEGATIVE Final    Comment: (NOTE) SARS-CoV-2 target nucleic acids are NOT DETECTED.  The SARS-CoV-2 RNA is generally detectable in upper respiratory specimens during the acute phase of infection. The lowest concentration of SARS-CoV-2 viral copies this assay can detect is 138 copies/mL. A negative result does not preclude SARS-Cov-2 infection and should not be used as the sole basis for treatment or other patient management decisions. A negative result may occur with  improper specimen collection/handling, submission of specimen other than nasopharyngeal swab, presence of viral mutation(s) within the areas targeted by this assay, and inadequate number of viral copies(<138 copies/mL). A negative result must be combined with clinical observations, patient history, and epidemiological information. The expected result is Negative.  Fact Sheet for Patients:  BloggerCourse.com  Fact Sheet for Healthcare Providers:  SeriousBroker.it  This test is no t yet approved or cleared by the [place] FDA and  has been authorized for detection and/or diagnosis of SARS-CoV-2 by FDA under an Emergency Use Authorization (EUA). This EUA will remain   in effect (meaning this test can be used) for the duration of the COVID-19 declaration under Section 564(b)(1) of the Act, 21 U.S.C.section 360bbb-3(b)(1), unless the authorization is terminated  or revoked sooner.       Influenza A by PCR NEGATIVE NEGATIVE Final   Influenza B by PCR NEGATIVE NEGATIVE Final    Comment: (NOTE) The Xpert Xpress SARS-CoV-2/FLU/RSV plus assay is intended as an aid in the diagnosis of influenza from Nasopharyngeal swab specimens and should not be used as a sole basis for treatment. Nasal washings and aspirates are unacceptable for Xpert Xpress SARS-CoV-2/FLU/RSV testing.  Fact Sheet for Patients: BloggerCourse.com  Fact Sheet for Healthcare Providers: SeriousBroker.it  This test is not yet approved or cleared by the [place] FDA and has been authorized for detection and/or diagnosis of SARS-CoV-2 by FDA under an Emergency Use Authorization (EUA). This EUA will remain in effect (meaning this test can be used) for the duration of the COVID-19 declaration under Section 564(b)(1) of the Act, 21 U.S.C. section 360bbb-3(b)(1), unless the authorization is terminated or revoked.  Performed at [facility], [address] [place], [place] [address]     Coagulation Studies: No results for input(s): LABPROT, INR in the last 72 hours.  Urinalysis: No results for input(s): COLORURINE, LABSPEC, PHURINE, GLUCOSEU, HGBUR, BILIRUBINUR, KETONESUR, PROTEINUR, UROBILINOGEN, NITRITE, LEUKOCYTESUR in the last 72 hours.  Invalid input(s): APPERANCEUR    Imaging: No results found.   Medications:   .  ceFAZolin (ANCEF) IV     . apixaban  2.5 mg Oral BID  . bisacodyl  10 mg Oral Daily  . Chlorhexidine Gluconate Cloth  6 each Topical Daily  . docusate sodium  100 mg Oral BID  . feeding supplement  237 mL Oral BID BM  . lidocaine  1 patch Transdermal Q24H  . losartan  25 mg Oral Daily  .  metoprolol tartrate  25 mg Oral Q6H  . multivitamin with minerals  1 tablet Oral Daily  . pantoprazole  40 mg Oral Daily  . polyethylene glycol  17 g Oral BID  . QUEtiapine  25 mg Oral QHS  . senna-docusate  2 tablet Oral BID   hydrALAZINE, HYDROcodone-acetaminophen, metoCLOPramide **OR** metoCLOPramide (REGLAN) injection, morphine injection, ondansetron **OR** ondansetron (ZOFRAN) IV  Assessment/ Plan:  87 y.o. female with atrial fibrillation requiring anticoagulation with Eliquis, diastolic CHF, dementia, hypertension, pacemaker, was admitted on 06/24/2020 for  Principal Problem:   Closed displaced  fracture of right femoral neck (HCC) Active Problems:   Alzheimer's dementia without behavioral disturbance (HCC)   Atrial fibrillation and flutter (HCC)   Benign essential HTN   PAD (peripheral artery disease) (HCC)   Status post placement of cardiac pacemaker   Chronic anticoagulation   Preoperative clearance   Distal radius fracture, right   Protein-calorie malnutrition, severe  Closed right hip fracture (HCC) [S72.001A] Fall, initial encounter [W19.XXXA] Closed displaced fracture of right femoral neck (HCC) [S72.001A] Closed fracture of distal end of right radius, unspecified fracture morphology, initial encounter [S52.501A]  #. Hyponatremia  -likely due to postop SIADH -Baseline Sodium- 135 (Jun 25, 2020) -NA remains at 131 - monitor for now. -Encouraged patient to improve oral intake    LOS: 8 [employer] 2/25/202212:47 PM  [facility] [place], [place] [phone]

## 2020-07-03 NOTE — Progress Notes (Signed)
Trial without foley catheter was unsuccessful. Bladder scan done . 48fr Foley cath inserted and baloon inflated with prefilled water. Drained within the first . Pt tolerated procedure with family support. Pt is resting with yes closed at time of note writing.

## 2020-07-03 NOTE — Progress Notes (Signed)
Daily Progress Note   Patient Name: Kristen Osborn       Date: 07/03/2020 DOB: 1926-08-08  Age: 87 y.o. MRN#: [id number] Attending Physician: Arnetha Courser, MD Primary Care Physician: Danella Penton, MD Admit Date: 06/24/2020  Reason for Consultation/Follow-up: Establishing goals of care  Subjective: Patient sitting up smiling, no complaints, just ate  Length of Stay: 8  Current Medications: Scheduled Meds:  . apixaban  2.5 mg Oral BID  . bisacodyl  10 mg Oral Daily  . Chlorhexidine Gluconate Cloth  6 each Topical Daily  . docusate sodium  100 mg Oral BID  . feeding supplement  237 mL Oral BID BM  . lidocaine  1 patch Transdermal Q24H  . losartan  25 mg Oral Daily  . metoprolol tartrate  25 mg Oral Q6H  . multivitamin with minerals  1 tablet Oral Daily  . pantoprazole  40 mg Oral Daily  . polyethylene glycol  17 g Oral BID  . QUEtiapine  25 mg Oral QHS  . senna-docusate  2 tablet Oral BID    Continuous Infusions: .  ceFAZolin (ANCEF) IV      PRN Meds: hydrALAZINE, HYDROcodone-acetaminophen, metoCLOPramide **OR** metoCLOPramide (REGLAN) injection, morphine injection, ondansetron **OR** ondansetron (ZOFRAN) IV  Physical Exam Constitutional:      General: She is not in acute distress. Pulmonary:     Effort: Pulmonary effort is normal.  Skin:    General: Skin is warm and dry.  Neurological:     Mental Status: She is alert. She is disoriented.  Psychiatric:        Mood and Affect: Mood normal.             Vital Signs: BP (!) 146/65 (BP Location: Left Arm)   Pulse 88   Temp 98.2 F (36.8 C)   Resp 17   Ht 5\' 6"  (1.676 m)   Wt 44.6 kg   SpO2 99%   BMI 15.87 kg/m  SpO2: SpO2: 99 % O2 Device: O2 Device: Room Air O2 Flow Rate: O2 Flow Rate (L/min): 8 L/min  Intake/output  summary:   Intake/Output Summary (Last 24 hours) at 07/03/2020 1236 Last data filed at 07/03/2020 1125 Gross per 24 hour  Intake --  Output 1100 ml  Net -1100 ml   LBM: Last BM Date: 07/01/20 Baseline Weight: Weight: 45.4 kg Most recent weight: Weight: 44.6 kg       Palliative Assessment/Data: PPS 50%    Flowsheet Rows   Flowsheet Row Most Recent Value  Intake Tab   Referral Department Hospitalist  Unit at Time of Referral ICU  Palliative Care Primary Diagnosis Trauma  Date Notified 07/02/20  Palliative Care Type New Palliative care  Reason for referral Clarify Goals of Care  Date of Admission 06/24/20  Date first seen by Palliative Care 07/02/20  # of days Palliative referral response time 0 Day(s)  # of days IP prior to Palliative referral 8  Clinical Assessment   Palliative Performance Scale Score 50%  Psychosocial & Spiritual Assessment   Palliative Care Outcomes   Patient/Family meeting held? Yes  Who was at the meeting? daughter, patient  Palliative Care Outcomes Clarified goals of care, Provided advance care planning,  Provided psychosocial or spiritual support, Changed CPR status, Completed durable DNR, ACP counseling assistance      Patient Active Problem List   Diagnosis Date Noted  . Protein-calorie malnutrition, severe 06/26/2020  . Chronic anticoagulation 06/25/2020  . Closed displaced fracture of right femoral neck (HCC) 06/25/2020  . Preoperative clearance 06/25/2020  . Distal radius fracture, right 06/25/2020  . Closed right hip fracture (HCC) 06/25/2020  . PAD (peripheral artery disease) (HCC) 10/17/2018  . Status post placement of cardiac pacemaker 10/17/2018  . Alzheimer's dementia without behavioral disturbance (HCC) 04/17/2018  . Atrial fibrillation and flutter (HCC) 04/17/2018  . Benign essential HTN 03/20/2014    Palliative Care Assessment & Plan   HPI: 87 y.o. female  with past medical history of atrial fibrillation on Eliquis, diastolic  heart failure, dementia, HTN, PAD, and pacemaker placement admitted on 06/24/2020 with an accidental fall. Diagnosed with right femoral neck and distal right radius fracture. ORIF on 2/20. Did require transfer to ICU for hyponatremia and hypertonic saline. Now sodium improving and transferring out of ICU. PMT consulted to discuss GOC.   Assessment: Follow up with patient and daughter [name]. Family pleased with patient progression and looking forward to discharge soon. No questions or concerns.  We review MOST that was discussed yesterday with patient's daughter Lelan Pons. [name] agrees with Psychologist, occupational. Becky signed MOST and copies of MOST and DNR were provided to [facility].    The patient and family outlined their wishes for the following treatment decisions:  Cardiopulmonary Resuscitation: Do Not Attempt Resuscitation (DNR/No CPR)  Medical Interventions: Limited Additional Interventions: Use medical treatment, IV fluids and cardiac monitoring as indicated, DO NOT USE intubation or mechanical ventilation. May consider use of less invasive airway support such as BiPAP or CPAP. Also provide comfort measures. Transfer to the hospital if indicated. Avoid intensive care.   Antibiotics: Antibiotics if indicated  IV Fluids: IV fluids if indicated  Feeding Tube: No feeding tube   Continue to hope for progression at rehab with ultimate return to home environment.   Recommendations/Plan: MOST and DNR completed, sent to [facility], paper copies in chart and copies given to family Plan for SNF do not think she is hospice eligible as she is not FAST 7c yet - still ambulatory, maintains some vocab, decent appetite   Code Status: DNR  Discharge Planning: Skilled Nursing Facility for rehab with Palliative care service follow-up  Care plan was discussed with patient's daughter [name]  Thank you for allowing the Palliative Medicine Team to assist in the care of this patient.   Total Time 20 minutes  Prolonged Time Billed  no       Greater than 50%  of this time was spent counseling and coordinating care related to the above assessment and plan.  Gerlean Ren, DNP, [facility] Palliative Medicine Team Team Phone # [phone]  Pager [phone]

## 2020-07-03 NOTE — TOC Progression Note (Addendum)
Transition of Care [facility]) - Progression Note    Patient Details  Name: Kristen Osborn MRN: [id number] Date of Birth: 1927-04-08  Transition of Care [facility]) CM/SW Contact  Margarito Liner, LCSW Phone Number: 07/03/2020, 10:32 AM  Clinical Narrative:  Per MD, patient medically stable for discharge today. [facility] won't have a bed until Monday. Discussed with daughter at bedside, [name], and let her know that [facility] said they have a bed today. [name] will discuss with her sisters and follow up with decision.   12:52 pm: Spoke to patient's daughter Corrie Dandy. She voiced concerns that patient not stable for discharge yet due to bladder not emptying (requiring two bladder scans last night) and pain medication causing her to not sleep. Asked MD to call her.  3:42 pm: Uploaded clinicals into [employer] to start auth for [employer] on Monday.  Expected Discharge Plan: Skilled Nursing Facility Barriers to Discharge: No Barriers Identified  Expected Discharge Plan and Services Expected Discharge Plan: Skilled Nursing Facility       Living arrangements for the past 2 months: Skilled Nursing Facility                                       Social Determinants of Health (SDOH) Interventions    Readmission Risk Interventions No flowsheet data found.

## 2020-07-03 NOTE — Progress Notes (Signed)
PROGRESS NOTE    Kristen Osborn  ONG:[id number] DOB: 12-12-1926 DOA: 06/24/2020 PCP: Danella PentonMiller, Mark F, MD   Brief Narrative: Taken from prior notes. Kristen Osborn a 87 y.o.femalewith medical history significant foratrial fibrillation on Eliquis, diastolic heart failure, dementia, HTN, PAD pacemaker placement, was brought to the emergency room following an accidental fall.:X-ray right hip: Displaced right femoral neck fracture s/p ORIF on 06/28/2020 with Dr. Odis LusterBowers.  Tolerated the procedure well.  On 06/26/2020 Foley catheter was attempted and during advancement it emerged out of the vagina, there was some concern of fistula, which will need outpatient evaluation.  Patient is not a candidate for any advanced surgeries. Per daughter patient had issues with Foley placement in the past.  Developed postoperative hyponatremia, not responding to IV fluid so she was transferred to ICU for hypertonic saline and nephrology was consulted. Transferred out of ICU as sodium improved with hypertonic saline.  Patient was given voiding trial on 07/02/2020 which she failed, Foley was reinserted.  Patient needs to be discharged with Foley catheter and follow-up with urology as an outpatient.  Family wants her to go to [facility] and there is no bed available till Monday.  Will need a Covid testing on Sunday.  Subjective: Patient with no new complaint today.  Having some mild pain in right wrist, stating it is bearable.  Appetite remained poor. Daughter is concerned that she is unable to void and should remain in hospital, tried explaining that it is not the reason to stay in hospital and she needs to follow-up with urology as an outpatient, she will be discharged with Foley.  Assessment & Plan:   Principal Problem:   Closed displaced fracture of right femoral neck (HCC) Active Problems:   Alzheimer's dementia without behavioral disturbance (HCC)   Atrial fibrillation and flutter (HCC)   Benign  essential HTN   PAD (peripheral artery disease) (HCC)   Status post placement of cardiac pacemaker   Chronic anticoagulation   Preoperative clearance   Distal radius fracture, right   Protein-calorie malnutrition, severe  Right femoral neck and distal right radius fracture secondary to mechanical fall.  S/p hemiarthroplasty and reduction of her right wrist. She was taken to the OR on 06/28/2020 as she was on Eliquis at home, waited few days for Eliquis washout. PT is recommending SNF placement-family wants [facility] to [facility], no bed available till Monday. Patient with declining health, worsening dementia and no recent hip fracture, and failure to thrive.  High risk for deterioration and death. -Palliative care consult-family wants to continue current level of care, but status changed to DNR. -Continue with physical therapy. -Eliquis was restarted postoperatively. -She will follow-up with orthopedic surgery as an outpatient on 3/7 or 8 for staple removal.  Hyponatremia.  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  Required hypertonic saline and ICU. Sodium now stable at 131 with baseline of 135. -Continue to monitor -Courage p.o. intake  Atrial fibrillation and flutter.  Patient with RVR while in ED which resolved with one-time dose of IV diltiazem.  Currently heart rate controlled Her home dose of Eliquis was held initially and resumed postoperatively. 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 Concern of fistula per nursing staff as Foley catheter imaged out of vagina on progression.  Urology was consulted and they are recommending outpatient follow-up. According to urology recommendation Foley catheter should remain in for at least 1 week and voiding trial should be obtained after  constipation resolved, constipation resolved today. In addition to the above, it sounds based on  her history, that she may have some urethral atrophy versus kinking versus stenosis or perhaps that urethra is somewhat ectopic as a result in that the catheter may have just simply coiled in the vagina rather than the possibility of a vesicovaginal fistula. Either way, we will not plan for any further work-up of this at this time. No need for further imaging. -Failed voiding trial yesterday requiring reinsertion of Foley catheter, will be discharged with Foley catheter and follow-up with urology as an outpatient.  Alzheimer's dementia without behavioral disturbance (HCC) -Continue Haldol as needed behavioral disturbance -continue quetiapine  Essential HTN.  Blood pressure within goal. -Continue home dose of losartan and beta-blocker.  History of cardiac pacemaker.  No acute concern  Objective: Vitals:   07/03/20 0513 07/03/20 0821 07/03/20 0822 07/03/20 1112  BP: 134/88 (!) 158/96  (!) 146/65  Pulse: 85 (!) 45 (!) 102 88  Resp: 18 17  17   Temp: 98.4 F (36.9 C) 97.9 F (36.6 C)  98.2 F (36.8 C)  TempSrc:  Oral    SpO2: 94% 99% 97% 99%  Weight:      Height:        Intake/Output Summary (Last 24 hours) at 07/03/2020 1503 Last data filed at 07/03/2020 1404 Gross per 24 hour  Intake 0 ml  Output 1100 ml  Net -1100 ml   Filed Weights   06/24/20 2319 06/25/20 0411  Weight: 45.4 kg 44.6 kg    Examination:  General.  Frail, emaciated elderly lady, in no acute distress. Pulmonary.  Lungs clear bilaterally, normal respiratory effort. CV.  Regular rate and rhythm, no JVD, rub or murmur. Abdomen.  Soft, nontender, nondistended, BS positive. CNS.  Alert and oriented x3.  No focal neurologic deficit. Extremities.  No edema, no cyanosis, pulses intact and symmetrical. Psychiatry.  Judgment and insight appears normal.  DVT prophylaxis: Eliquis Code Status: Full Family Communication: Daughter was updated at bedside. Disposition Plan:  Status is: Inpatient  Remains  inpatient appropriate because:Inpatient level of care appropriate due to severity of illness   Dispo: The patient is from: Home              Anticipated d/c is to: SNF              Anticipated d/c date is: 1 day.              Patient currently is medically stable.   Difficult to place patient No              Level of care: Med-Surg  All the records are reviewed and case discussed with Care Management/Social Worker. Management plans discussed with the patient, nursing and they are in agreement.  Consultants:   Nephrology  Orthopedic surgery  Procedures:  Antimicrobials:   Data Reviewed: I have personally reviewed following labs and imaging studies  CBC: Recent Labs  Lab 06/28/20 0431 06/29/20 0349 06/30/20 0449 07/01/20 0129 07/03/20 0654  WBC 11.1* 12.6* 13.1* 12.4* 9.7  HGB 11.8* 11.0* 10.3* 10.3* 9.3*  HCT 34.6* 31.7* 30.1* 30.1* 27.0*  MCV 86.1 85.0 87.2 87.0 86.8  PLT 164 177 186 193 287   Basic Metabolic Panel: Recent Labs  Lab 06/28/20 0431 06/29/20 0349 06/30/20 0449 06/30/20 1609 07/01/20 0129 07/01/20 1053 07/01/20 2200 07/02/20 0200 07/02/20 0535 07/02/20 2356 07/03/20 0654  NA 125* 124* 125*   < > 124*   < > 128*  130* 131* 131* 131*  K 3.5 3.9 3.8  --  3.9  --   --   --   --   --  4.1  CL 89* 90* 90*  --  90*  --   --   --   --   --  97*  CO2 27 26 29   --  29  --   --   --   --   --  26  GLUCOSE 130* 155* 199*  --  136*  --   --   --   --   --  119*  BUN 30* 32* 35*  --  36*  --   --   --   --   --  34*  CREATININE 0.66 0.63 0.81  --  0.69  --   --   --   --   --  0.62  CALCIUM 8.8* 8.4* 8.5*  --  8.1*  --   --   --   --   --  8.3*   < > = values in this interval not displayed.   GFR: Estimated Creatinine Clearance: 32.2 mL/min (by C-G formula based on SCr of 0.62 mg/dL). Liver Function Tests: No results for input(s): AST, ALT, ALKPHOS, BILITOT, PROT, ALBUMIN in the last 168 hours. No results for input(s): LIPASE, AMYLASE in the last 168  hours. No results for input(s): AMMONIA in the last 168 hours. Coagulation Profile: No results for input(s): INR, PROTIME in the last 168 hours. Cardiac Enzymes: No results for input(s): CKTOTAL, CKMB, CKMBINDEX, TROPONINI in the last 168 hours. BNP (last 3 results) No results for input(s): PROBNP in the last 8760 hours. HbA1C: No results for input(s): HGBA1C in the last 72 hours. CBG: Recent Labs  Lab 06/30/20 2053  GLUCAP 152*   Lipid Profile: No results for input(s): CHOL, HDL, LDLCALC, TRIG, CHOLHDL, LDLDIRECT in the last 72 hours. Thyroid Function Tests: No results for input(s): TSH, T4TOTAL, FREET4, T3FREE, THYROIDAB in the last 72 hours. Anemia Panel: Recent Labs    07/03/20 0654 07/03/20 0813  VITAMINB12  --  2,816*  FOLATE 11.5  --   FERRITIN 473*  --   TIBC 171*  --   IRON 29  --   RETICCTPCT 3.3*  --    Sepsis Labs: No results for input(s): PROCALCITON, LATICACIDVEN in the last 168 hours.  Recent Results (from the past 240 hour(s))  Resp Panel by RT-PCR (Flu A&B, Covid)     Status: None   Collection Time: 06/25/20  2:28 AM  Result Value Ref Range Status   SARS Coronavirus 2 by RT PCR NEGATIVE NEGATIVE Final    Comment: (NOTE) SARS-CoV-2 target nucleic acids are NOT DETECTED.  The SARS-CoV-2 RNA is generally detectable in upper respiratory specimens during the acute phase of infection. The lowest concentration of SARS-CoV-2 viral copies this assay can detect is 138 copies/mL. A negative result does not preclude SARS-Cov-2 infection and should not be used as the sole basis for treatment or other patient management decisions. A negative result may occur with  improper specimen collection/handling, submission of specimen other than nasopharyngeal swab, presence of viral mutation(s) within the areas targeted by this assay, and inadequate number of viral copies(<138 copies/mL). A negative result must be combined with clinical observations, patient history,  and epidemiological information. The expected result is Negative.  Fact Sheet for Patients:  06/27/20  Fact Sheet for Healthcare Providers:  BloggerCourse.com  This test is no t yet approved  or cleared by the [place] and  has been authorized for detection and/or diagnosis of SARS-CoV-2 by FDA under an Emergency Use Authorization (EUA). This EUA will remain  in effect (meaning this test can be used) for the duration of the COVID-19 declaration under Section 564(b)(1) of the Act, 21 U.S.C.section 360bbb-3(b)(1), unless the authorization is terminated  or revoked sooner.       Influenza A by PCR NEGATIVE NEGATIVE Final   Influenza B by PCR NEGATIVE NEGATIVE Final    Comment: (NOTE) The Xpert Xpress SARS-CoV-2/FLU/RSV plus assay is intended as an aid in the diagnosis of influenza from Nasopharyngeal swab specimens and should not be used as a sole basis for treatment. Nasal washings and aspirates are unacceptable for Xpert Xpress SARS-CoV-2/FLU/RSV testing.  Fact Sheet for Patients: BloggerCourse.com  Fact Sheet for Healthcare Providers: SeriousBroker.it  This test is not yet approved or cleared by the [place] FDA and has been authorized for detection and/or diagnosis of SARS-CoV-2 by FDA under an Emergency Use Authorization (EUA). This EUA will remain in effect (meaning this test can be used) for the duration of the COVID-19 declaration under Section 564(b)(1) of the Act, 21 U.S.C. section 360bbb-3(b)(1), unless the authorization is terminated or revoked.  Performed at [facility], [address], [place], [place] [address]      Radiology Studies: No results found.  Scheduled Meds: . apixaban  2.5 mg Oral BID  . bisacodyl  10 mg Oral Daily  . Chlorhexidine Gluconate Cloth  6 each Topical Daily  . docusate sodium  100 mg Oral BID  .  feeding supplement  237 mL Oral BID BM  . lidocaine  1 patch Transdermal Q24H  . losartan  25 mg Oral Daily  . metoprolol tartrate  25 mg Oral Q6H  . multivitamin with minerals  1 tablet Oral Daily  . pantoprazole  40 mg Oral Daily  . polyethylene glycol  17 g Oral BID  . QUEtiapine  25 mg Oral QHS  . senna-docusate  2 tablet Oral BID   Continuous Infusions: .  ceFAZolin (ANCEF) IV       LOS: 8 days   Time spent: 25 minutes. More than 50% of the time was spent in counseling/coordination of care  Arnetha Courser, MD [facility]  If 7PM-7AM, please contact night-coverage [email or web address]  07/03/2020, 3:03 PM   This record has been created using Conservation officer, historic buildings. Errors have been sought and corrected,but may not always be located. Such creation errors do not reflect on the standard of care.

## 2020-07-03 NOTE — Progress Notes (Signed)
Nutrition Follow-up  DOCUMENTATION CODES:   Severe malnutrition in context of chronic illness,Underweight  INTERVENTION:  Increase to Ensure Enlive po TID, each supplement provides 350 kcal and 20 grams of protein.  Continue Magic cup TID with meals, each supplement provides 290 kcal and 9 grams of protein.  Continue MVI po daily.  NUTRITION DIAGNOSIS:   Severe Malnutrition related to chronic illness (dementia, CHF) as evidenced by severe fat depletion,severe muscle depletion.  Ongoing.  GOAL:   Patient will meet greater than or equal to 90% of their needs  Not met.  MONITOR:   PO intake,Supplement acceptance,Labs,Weight trends,I & O's  REASON FOR ASSESSMENT:   Consult Assessment of nutrition requirement/status  ASSESSMENT:   87 year old female with PMHx of CHF, dementia, A-fib, HTN, pacemaker placement who is admitted after a fall with displaced right femoral neck fracture.   2/20 s/p right anterior hip hemiarthroplasty  Met with patient and one of her daughters at bedside. Daughter reports patient's appetite is poor now. She is only eating bites/sips of meals. She repots her appetite was fairly good until several days ago but has gotten worse. She is drinking Ensure but is only able to finish parts of the bottles. Patient is taking bites of Magic Cup. Family does not want a feeding tube for patient (per discussion with Palliative Care). Daughter is asking if patient could have an appetite stimulant. Sent secure chat to MD regarding daughter's question about an appetite stimulant.  Medications reviewed and include: Colace 100 mg BID, MVI daily, Protonix, Miralax, senna-docusate 2 tablets BID.  Labs reviewed: Sodium 131, Chloride 97, BUN 34, vitamin B12 2816.  I/O: 550 mL UOP yesterday (0.5 mL/kg/hr)  No weight to trend since 2/17  Diet Order:   Diet Order            Diet regular Room service appropriate? Yes; Fluid consistency: Thin  Diet effective now                 EDUCATION NEEDS:   No education needs have been identified at this time  Skin:  Skin Assessment: Skin Integrity Issues: Skin Integrity Issues:: Incisions Incisions: closed incisions right hip and right wrist  Last BM:  07/01/2020 per chart  Height:   Ht Readings from Last 1 Encounters:  06/24/20 '5\' 6"'  (1.676 m)   Weight:   Wt Readings from Last 1 Encounters:  06/25/20 44.6 kg   BMI:  Body mass index is 15.87 kg/m.  Estimated Nutritional Needs:   Kcal:  1300-1500  Protein:  65-75 grams  Fluid:  1.2-1.5 L/day  Jacklynn Barnacle, MS, RD, LDN Pager number available on Amion

## 2020-07-04 DIAGNOSIS — S72001A Fracture of unspecified part of neck of right femur, initial encounter for closed fracture: Secondary | ICD-10-CM | POA: Diagnosis not present

## 2020-07-04 LAB — BASIC METABOLIC PANEL
Anion gap: 7 (ref 5–15)
BUN: 26 mg/dL — ABNORMAL HIGH (ref 8–23)
CO2: 27 mmol/L (ref 22–32)
Calcium: 8.3 mg/dL — ABNORMAL LOW (ref 8.9–10.3)
Chloride: 96 mmol/L — ABNORMAL LOW (ref 98–111)
Creatinine, Ser: 0.57 mg/dL (ref 0.44–1.00)
GFR, Estimated: >60 mL/min (ref >60)
Glucose, Bld: 110 mg/dL — ABNORMAL HIGH (ref 70–99)
Potassium: 4 mmol/L (ref 3.5–5.1)
Sodium: 130 mmol/L — ABNORMAL LOW (ref 135–145)

## 2020-07-04 MED ORDER — FERROUS SULFATE 325 (65 FE) MG PO TABS
325.0000 mg | ORAL_TABLET | Freq: Every day | ORAL | Status: DC
  Administered 2020-07-05 – 2020-07-06 (×2): 325 mg via ORAL
  Filled 2020-07-04 (×2): qty 1

## 2020-07-04 MED ORDER — EPOETIN ALFA 4000 UNIT/ML IJ SOLN
3000.0000 [IU] | INTRAMUSCULAR | Status: DC
  Administered 2020-07-04: 3000 [IU] via SUBCUTANEOUS
  Filled 2020-07-04: qty 1

## 2020-07-04 MED ORDER — ZINC OXIDE 40 % EX OINT
TOPICAL_OINTMENT | CUTANEOUS | Status: DC | PRN
  Filled 2020-07-04: qty 113

## 2020-07-04 NOTE — Progress Notes (Signed)
[facility] [place], [place] 07/04/20  Subjective:   LOS: 9  Kristen Osborn is a 87 y.o. female with past medical history of Atrial Fibrillation, CHF and Dementia. She presents to the ED after a fall. She has underwent a right hip anterior hemiarthroplasty. We were consulted for hyponatremia.  Patient was sitting up in the chair eating the lunch. Patient was cheerful, in no distress. Patient offers no specific physical complaints   Objective:  Vital signs in last 24 hours:  Temp:  [98 F (36.7 C)-99.2 F (37.3 C)] 98.6 F (37 C) (02/26 1126) Pulse Rate:  [66-109] 77 (02/26 1126) Resp:  [15-19] 16 (02/26 1126) BP: (127-182)/(68-106) 150/68 (02/26 1126) SpO2:  [93 %-98 %] 97 % (02/26 1126)  Weight change:  Filed Weights   06/24/20 2319 06/25/20 0411  Weight: 45.4 kg 44.6 kg    Intake/Output:    Intake/Output Summary (Last 24 hours) at 07/04/2020 1432 Last data filed at 07/04/2020 1041 Gross per 24 hour  Intake 240 ml  Output 827 ml  Net -587 ml   Physical Exam: General:  No acute distress, sitting up in chair, frail, elderly  HEENT  anicteric, moist oral mucous membrane  Pulm/lungs  normal breathing effort, lungs are clear  CVS/Heart  irregular rhythm, no rub or gallop  Abdomen:   Soft, nontender  Extremities:  No peripheral edema, Rt arm in splint  Neurologic:   Alert and able to answer simple questions  Skin:  No acute rashes    Basic Metabolic Panel:  Recent Labs  Lab 06/29/20 0349 06/30/20 0449 06/30/20 1609 07/01/20 0129 07/01/20 1053 07/02/20 0200 07/02/20 0535 07/02/20 2356 07/03/20 0654 07/04/20 0514  NA 124* 125*   < > 124*   < > 130* 131* 131* 131* 130*  K 3.9 3.8  --  3.9  --   --   --   --  4.1 4.0  CL 90* 90*  --  90*  --   --   --   --  97* 96*  CO2 26 29  --  29  --   --   --   --  26 27  GLUCOSE 155* 199*  --  136*  --   --   --   --  119* 110*  BUN 32* 35*  --  36*  --   --   --   --  34* 26*  CREATININE 0.63 0.81   --  0.69  --   --   --   --  0.62 0.57  CALCIUM 8.4* 8.5*  --  8.1*  --   --   --   --  8.3* 8.3*   < > = values in this interval not displayed.     CBC: Recent Labs  Lab 06/28/20 0431 06/29/20 0349 06/30/20 0449 07/01/20 0129 07/03/20 0654  WBC 11.1* 12.6* 13.1* 12.4* 9.7  HGB 11.8* 11.0* 10.3* 10.3* 9.3*  HCT 34.6* 31.7* 30.1* 30.1* 27.0*  MCV 86.1 85.0 87.2 87.0 86.8  PLT 164 177 186 193 287     No results found for: HEPBSAG, HEPBSAB, HEPBIGM    Microbiology:  Recent Results (from the past 240 hour(s))  Resp Panel by RT-PCR (Flu A&B, Covid)     Status: None   Collection Time: 06/25/20  2:28 AM  Result Value Ref Range Status   SARS Coronavirus 2 by RT PCR NEGATIVE NEGATIVE Final    Comment: (NOTE) SARS-CoV-2 target nucleic acids are NOT DETECTED.  The SARS-CoV-2 RNA is generally detectable in upper respiratory specimens during the acute phase of infection. The lowest concentration of SARS-CoV-2 viral copies this assay can detect is 138 copies/mL. A negative result does not preclude SARS-Cov-2 infection and should not be used as the sole basis for treatment or other patient management decisions. A negative result may occur with  improper specimen collection/handling, submission of specimen other than nasopharyngeal swab, presence of viral mutation(s) within the areas targeted by this assay, and inadequate number of viral copies(<138 copies/mL). A negative result must be combined with clinical observations, patient history, and epidemiological information. The expected result is Negative.  Fact Sheet for Patients:  BloggerCourse.com  Fact Sheet for Healthcare Providers:  SeriousBroker.it  This test is no t yet approved or cleared by the [place] FDA and  has been authorized for detection and/or diagnosis of SARS-CoV-2 by FDA under an Emergency Use Authorization (EUA). This EUA will remain  in effect  (meaning this test can be used) for the duration of the COVID-19 declaration under Section 564(b)(1) of the Act, 21 U.S.C.section 360bbb-3(b)(1), unless the authorization is terminated  or revoked sooner.       Influenza A by PCR NEGATIVE NEGATIVE Final   Influenza B by PCR NEGATIVE NEGATIVE Final    Comment: (NOTE) The Xpert Xpress SARS-CoV-2/FLU/RSV plus assay is intended as an aid in the diagnosis of influenza from Nasopharyngeal swab specimens and should not be used as a sole basis for treatment. Nasal washings and aspirates are unacceptable for Xpert Xpress SARS-CoV-2/FLU/RSV testing.  Fact Sheet for Patients: BloggerCourse.com  Fact Sheet for Healthcare Providers: SeriousBroker.it  This test is not yet approved or cleared by the [place] FDA and has been authorized for detection and/or diagnosis of SARS-CoV-2 by FDA under an Emergency Use Authorization (EUA). This EUA will remain in effect (meaning this test can be used) for the duration of the COVID-19 declaration under Section 564(b)(1) of the Act, 21 U.S.C. section 360bbb-3(b)(1), unless the authorization is terminated or revoked.  Performed at [facility], [address] [place], [place] [address]     Coagulation Studies: No results for input(s): LABPROT, INR in the last 72 hours.  Urinalysis: No results for input(s): COLORURINE, LABSPEC, PHURINE, GLUCOSEU, HGBUR, BILIRUBINUR, KETONESUR, PROTEINUR, UROBILINOGEN, NITRITE, LEUKOCYTESUR in the last 72 hours.  Invalid input(s): APPERANCEUR    Imaging: No results found.   Medications:    . apixaban  2.5 mg Oral BID  . bisacodyl  10 mg Oral Daily  . Chlorhexidine Gluconate Cloth  6 each Topical Daily  . docusate sodium  100 mg Oral BID  . feeding supplement  237 mL Oral TID BM  . lidocaine  1 patch Transdermal Q24H  . losartan  25 mg Oral Daily  . metoprolol tartrate  25 mg Oral Q6H  .  multivitamin with minerals  1 tablet Oral Daily  . pantoprazole  40 mg Oral Daily  . polyethylene glycol  17 g Oral BID  . QUEtiapine  25 mg Oral QHS  . senna-docusate  2 tablet Oral BID   hydrALAZINE, HYDROcodone-acetaminophen, metoCLOPramide **OR** metoCLOPramide (REGLAN) injection, morphine injection, ondansetron **OR** ondansetron (ZOFRAN) IV  Assessment/ Plan:  87 y.o. female with atrial fibrillation requiring anticoagulation with Eliquis, diastolic CHF, dementia, hypertension, pacemaker, was admitted on 06/24/2020 for  Principal Problem:   Closed displaced fracture of right femoral neck (HCC) Active Problems:   Alzheimer's dementia without behavioral disturbance (HCC)   Atrial fibrillation and flutter (HCC)  Benign essential HTN   PAD (peripheral artery disease) (HCC)   Status post placement of cardiac pacemaker   Chronic anticoagulation   Preoperative clearance   Distal radius fracture, right   Protein-calorie malnutrition, severe  Closed right hip fracture (HCC) [S72.001A] Fall, initial encounter [W19.XXXA] Closed displaced fracture of right femoral neck (HCC) [S72.001A] Closed fracture of distal end of right radius, unspecified fracture morphology, initial encounter [S52.501A]  1) Hyponatremia  -Patient has low sodium secondary to Postop -SIADH -Pt sodium was low earlier at 125 , now at 130--131 -will continue to follow  -Encouraged patient to improve oral intake   2) Anemia of chronic disease  Iron saturation at goal Vitamin b 12- at goal  Results for WANZA, SZUMSKI (MRN [id number]) as of 07/04/2020 10:45  Ref. Range 07/03/2020 06:54 07/03/2020 08:13  Iron Latest Ref Range: 28 - 170 ug/dL 29   UIBC Latest Units: ug/dL 025   TIBC Latest Ref Range: 250 - 450 ug/dL 852 (L)   Saturation Ratios Latest Ref Range: 10.4 - 31.8 % 17   Ferritin Latest Ref Range: 11 - 307 ng/mL 473 (H)   Folate Latest Ref Range: >5.9 ng/mL 11.5   Vitamin B12 Latest Ref Range: 180 - 914  pg/mL  2,816 (H)    Will start pt on Epo.   3) hypertension Patient blood pressure goal for her age  78) dementia Primary is following   Plan We will start patient on Epogen .    LOS: 9 Cael Worth s [facility] 2/26/20222:32 PM  [facility] [place], [place] [phone]

## 2020-07-04 NOTE — Progress Notes (Signed)
PROGRESS NOTE    Kristen Osborn  OAC:[id number] DOB: [birth year] DOA: 06/24/2020 PCP: Danella Penton, MD   Brief Narrative: Taken from prior notes. Kristen W Cookis a 87 y.o.femalewith medical history significant foratrial fibrillation on Eliquis, diastolic heart failure, dementia, HTN, PAD pacemaker placement, was brought to the emergency room following an accidental fall.:X-ray right hip: Displaced right femoral neck fracture s/p ORIF on 06/28/2020 with Dr. Odis Luster.  Tolerated the procedure well.  On 06/26/2020 Foley catheter was attempted and during advancement it emerged out of the vagina, there was some concern of fistula, which will need outpatient evaluation.  Patient is not a candidate for any advanced surgeries. Per daughter patient had issues with Foley placement in the past.  Developed postoperative hyponatremia, not responding to IV fluid so she was transferred to ICU for hypertonic saline and nephrology was consulted. Transferred out of ICU as sodium improved with hypertonic saline.  Patient was given voiding trial on 07/02/2020 which she failed, Foley was reinserted.  Patient needs to be discharged with Foley catheter and follow-up with urology as an outpatient.  Family wants her to go to [facility] and there is no bed available till Monday.  Will need a Covid testing on Sunday.  Subjective: Patient was sitting comfortably in chair when seen today.  2 daughters at bedside.  No new complaint.  Assessment & Plan:   Principal Problem:   Closed displaced fracture of right femoral neck (HCC) Active Problems:   Alzheimer's dementia without behavioral disturbance (HCC)   Atrial fibrillation and flutter (HCC)   Benign essential HTN   PAD (peripheral artery disease) (HCC)   Status post placement of cardiac pacemaker   Chronic anticoagulation   Preoperative clearance   Distal radius fracture, right   Protein-calorie malnutrition, severe  Right femoral neck and distal  right radius fracture secondary to mechanical fall.  S/p hemiarthroplasty and reduction of her right wrist. She was taken to the OR on 06/28/2020 as she was on Eliquis at home, waited few days for Eliquis washout. PT is recommending SNF placement-family wants her to go to [facility], no bed available till Monday. Patient with declining health, worsening dementia and no recent hip fracture, and failure to thrive.  High risk for deterioration and death. -Palliative care consult-family wants to continue current level of care, but status changed to DNR. -Continue with physical therapy. -Eliquis was restarted postoperatively. -She will follow-up with orthopedic surgery as an outpatient on 3/7 or 8 for staple removal.  Hyponatremia.  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  Required hypertonic saline and ICU. Sodium now stable at 130 with baseline of 135. -Continue to monitor -Encourage p.o. intake  Atrial fibrillation and flutter.  Patient with RVR while in ED which resolved with one-time dose of IV diltiazem.  Currently heart rate controlled Her home dose of Eliquis was held initially and resumed postoperatively. 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 Concern of fistula per nursing staff as Foley catheter imaged out of vagina on progression.  Urology was consulted and they are recommending outpatient follow-up. According to urology recommendation Foley catheter should remain in for at least 1 week and voiding trial should be obtained after constipation resolved, constipation resolved today. In addition to the above, it sounds based on her history, that she may have some urethral atrophy versus kinking versus stenosis or perhaps that urethra is somewhat ectopic as a result in that the catheter may have just simply coiled  in the vagina rather than the possibility of a vesicovaginal fistula.  Either way, we will not plan for any further work-up of this at this time. No need for further imaging. -Failed voiding trial  requiring reinsertion of Foley catheter, will be discharged with Foley catheter and follow-up with urology as an outpatient.  Alzheimer's dementia without behavioral disturbance (HCC) -Continue Haldol as needed behavioral disturbance -continue quetiapine  Essential HTN.  Blood pressure within goal. -Continue home dose of losartan and beta-blocker.  History of cardiac pacemaker.  No acute concern.  Acute blood loss anemia.  Secondary to surgery.  Hemoglobin at 9.3.  No other obvious bleeding.  Patient is on Eliquis. Anemia panel more consistent with anemia of chronic disease. -Start her on low-dose iron supplement. -Monitor hemoglobin-if continues to decrease we will image her for any possible hematoma.  Objective: Vitals:   07/04/20 0531 07/04/20 0750 07/04/20 1050 07/04/20 1126  BP: (!) 148/70 (!) 151/72 130/68 (!) 150/68  Pulse: 66 80 85 77  Resp: 16 16 19 16   Temp: 98.7 F (37.1 C) 98.4 F (36.9 C) 98.3 F (36.8 C) 98.6 F (37 C)  TempSrc: Oral Oral    SpO2: 95% 93% 98% 97%  Weight:      Height:        Intake/Output Summary (Last 24 hours) at 07/04/2020 1521 Last data filed at 07/04/2020 1446 Gross per 24 hour  Intake 720 ml  Output 827 ml  Net -107 ml   Filed Weights   06/24/20 2319 06/25/20 0411  Weight: 45.4 kg 44.6 kg    Examination:  General.  Frail elderly lady, in no acute distress. Pulmonary.  Lungs clear bilaterally, normal respiratory effort. CV.  Regular rate and rhythm, no JVD, rub or murmur. Abdomen.  Soft, nontender, nondistended, BS positive. CNS.  Alert and oriented .  No focal neurologic deficit. Extremities.  No edema, no cyanosis, pulses intact and symmetrical. Psychiatry.  Judgment and insight appears normal.  DVT prophylaxis: Eliquis Code Status: Full Family Communication: 2 daughters were updated at  bedside. Disposition Plan:  Status is: Inpatient  Remains inpatient appropriate because:Inpatient level of care appropriate due to severity of illness   Dispo: The patient is from: Home              Anticipated d/c is to: SNF              Anticipated d/c date is: 1 day.              Patient currently is medically stable.   Difficult to place patient No              Level of care: Med-Surg  All the records are reviewed and case discussed with Care Management/Social Worker. Management plans discussed with the patient, nursing and they are in agreement.  Consultants:   Nephrology  Orthopedic surgery  Procedures:  Antimicrobials:   Data Reviewed: I have personally reviewed following labs and imaging studies  CBC: Recent Labs  Lab 06/28/20 0431 06/29/20 0349 06/30/20 0449 07/01/20 0129 07/03/20 0654  WBC 11.1* 12.6* 13.1* 12.4* 9.7  HGB 11.8* 11.0* 10.3* 10.3* 9.3*  HCT 34.6* 31.7* 30.1* 30.1* 27.0*  MCV 86.1 85.0 87.2 87.0 86.8  PLT 164 177 186 193 287   Basic Metabolic Panel: Recent Labs  Lab 06/29/20 0349 06/30/20 0449 06/30/20 1609 07/01/20 0129 07/01/20 1053 07/02/20 0200 07/02/20 0535 07/02/20 2356 07/03/20 0654 07/04/20 0514  NA 124* 125*   < > 124*   < >  130* 131* 131* 131* 130*  K 3.9 3.8  --  3.9  --   --   --   --  4.1 4.0  CL 90* 90*  --  90*  --   --   --   --  97* 96*  CO2 26 29  --  29  --   --   --   --  26 27  GLUCOSE 155* 199*  --  136*  --   --   --   --  119* 110*  BUN 32* 35*  --  36*  --   --   --   --  34* 26*  CREATININE 0.63 0.81  --  0.69  --   --   --   --  0.62 0.57  CALCIUM 8.4* 8.5*  --  8.1*  --   --   --   --  8.3* 8.3*   < > = values in this interval not displayed.   GFR: Estimated Creatinine Clearance: 32.2 mL/min (by C-G formula based on SCr of 0.57 mg/dL). Liver Function Tests: No results for input(s): AST, ALT, ALKPHOS, BILITOT, PROT, ALBUMIN in the last 168 hours. No results for input(s): LIPASE, AMYLASE in the last  168 hours. No results for input(s): AMMONIA in the last 168 hours. Coagulation Profile: No results for input(s): INR, PROTIME in the last 168 hours. Cardiac Enzymes: No results for input(s): CKTOTAL, CKMB, CKMBINDEX, TROPONINI in the last 168 hours. BNP (last 3 results) No results for input(s): PROBNP in the last 8760 hours. HbA1C: No results for input(s): HGBA1C in the last 72 hours. CBG: Recent Labs  Lab 06/30/20 2053  GLUCAP 152*   Lipid Profile: No results for input(s): CHOL, HDL, LDLCALC, TRIG, CHOLHDL, LDLDIRECT in the last 72 hours. Thyroid Function Tests: No results for input(s): TSH, T4TOTAL, FREET4, T3FREE, THYROIDAB in the last 72 hours. Anemia Panel: Recent Labs    07/03/20 0654 07/03/20 0813  VITAMINB12  --  2,816*  FOLATE 11.5  --   FERRITIN 473*  --   TIBC 171*  --   IRON 29  --   RETICCTPCT 3.3*  --    Sepsis Labs: No results for input(s): PROCALCITON, LATICACIDVEN in the last 168 hours.  Recent Results (from the past 240 hour(s))  Resp Panel by RT-PCR (Flu A&B, Covid)     Status: None   Collection Time: 06/25/20  2:28 AM  Result Value Ref Range Status   SARS Coronavirus 2 by RT PCR NEGATIVE NEGATIVE Final    Comment: (NOTE) SARS-CoV-2 target nucleic acids are NOT DETECTED.  The SARS-CoV-2 RNA is generally detectable in upper respiratory specimens during the acute phase of infection. The lowest concentration of SARS-CoV-2 viral copies this assay can detect is 138 copies/mL. A negative result does not preclude SARS-Cov-2 infection and should not be used as the sole basis for treatment or other patient management decisions. A negative result may occur with  improper specimen collection/handling, submission of specimen other than nasopharyngeal swab, presence of viral mutation(s) within the areas targeted by this assay, and inadequate number of viral copies(<138 copies/mL). A negative result must be combined with clinical observations, patient  history, and epidemiological information. The expected result is Negative.  Fact Sheet for Patients:  BloggerCourse.com  Fact Sheet for Healthcare Providers:  SeriousBroker.it  This test is no t yet approved or cleared by the [place] FDA and  has been authorized for detection and/or diagnosis of SARS-CoV-2 by FDA  under an Emergency Use Authorization (EUA). This EUA will remain  in effect (meaning this test can be used) for the duration of the COVID-19 declaration under Section 564(b)(1) of the Act, 21 U.S.C.section 360bbb-3(b)(1), unless the authorization is terminated  or revoked sooner.       Influenza A by PCR NEGATIVE NEGATIVE Final   Influenza B by PCR NEGATIVE NEGATIVE Final    Comment: (NOTE) The Xpert Xpress SARS-CoV-2/FLU/RSV plus assay is intended as an aid in the diagnosis of influenza from Nasopharyngeal swab specimens and should not be used as a sole basis for treatment. Nasal washings and aspirates are unacceptable for Xpert Xpress SARS-CoV-2/FLU/RSV testing.  Fact Sheet for Patients: BloggerCourse.com  Fact Sheet for Healthcare Providers: SeriousBroker.it  This test is not yet approved or cleared by the [place] FDA and has been authorized for detection and/or diagnosis of SARS-CoV-2 by FDA under an Emergency Use Authorization (EUA). This EUA will remain in effect (meaning this test can be used) for the duration of the COVID-19 declaration under Section 564(b)(1) of the Act, 21 U.S.C. section 360bbb-3(b)(1), unless the authorization is terminated or revoked.  Performed at [facility], [address], [place], [place] [address]      Radiology Studies: No results found.  Scheduled Meds: . apixaban  2.5 mg Oral BID  . bisacodyl  10 mg Oral Daily  . Chlorhexidine Gluconate Cloth  6 each Topical Daily  . docusate sodium  100 mg Oral  BID  . epoetin (EPOGEN/PROCRIT) injection  3,000 Units Subcutaneous Q14 Days  . feeding supplement  237 mL Oral TID BM  . [START ON 07/05/2020] ferrous sulfate  325 mg Oral Q breakfast  . lidocaine  1 patch Transdermal Q24H  . losartan  25 mg Oral Daily  . metoprolol tartrate  25 mg Oral Q6H  . multivitamin with minerals  1 tablet Oral Daily  . pantoprazole  40 mg Oral Daily  . polyethylene glycol  17 g Oral BID  . QUEtiapine  25 mg Oral QHS  . senna-docusate  2 tablet Oral BID   Continuous Infusions:    LOS: 9 days   Time spent: 25 minutes. More than 50% of the time was spent in counseling/coordination of care  Arnetha Courser, MD [facility]  If 7PM-7AM, please contact night-coverage [email or web address]  07/04/2020, 3:21 PM   This record has been created using Conservation officer, historic buildings. Errors have been sought and corrected,but may not always be located. Such creation errors do not reflect on the standard of care.

## 2020-07-04 NOTE — Progress Notes (Signed)
Subjective: 6 Days Post-Op Procedure(s) (LRB): ARTHROPLASTY BIPOLAR HIP (HEMIARTHROPLASTY) (Right) CLOSED REDUCTION WRIST (Right) Patient is alert and eating supper.  She looks quite good.  Neurovascular status is good.  Dressings are dry.  Patient reports pain as mild.  Objective:   VITALS:   Vitals:   07/04/20 1126 07/04/20 1552  BP: (!) 150/68 122/65  Pulse: 77 71  Resp: 16 18  Temp: 98.6 F (37 C) 98.5 F (36.9 C)  SpO2: 97% 99%    Neurologically intact Neurovascular intact Dorsiflexion/Plantar flexion intact Incision: dressing C/D/I  LABS Recent Labs    07/03/20 0654  HGB 9.3*  HCT 27.0*  WBC 9.7  PLT 287    Recent Labs    07/02/20 2356 07/03/20 0654 07/04/20 0514  NA 131* 131* 130*  K  --  4.1 4.0  BUN  --  34* 26*  CREATININE  --  0.62 0.57  GLUCOSE  --  119* 110*    No results for input(s): LABPT, INR in the last 72 hours.   Assessment/Plan: 6 Days Post-Op Procedure(s) (LRB): ARTHROPLASTY BIPOLAR HIP (HEMIARTHROPLASTY) (Right) CLOSED REDUCTION WRIST (Right)   Advance diet Up with therapy Discharge to SNF

## 2020-07-04 NOTE — Progress Notes (Signed)
Physical Therapy Treatment Patient Details Name: Kristen Osborn MRN: [id number] DOB: Mar 28, 1927 Today's Date: 07/04/2020    History of Present Illness Pt is a 87 y.o. female presenting to hospital 2/16 s/p fall at home c/o R hip and R hand pain.  Pt admitted with closed displaced fx of R femoral neck, distal radius fx R, a-fib and a-flutter with RVR in ER; pt also noted with urinary retention.  Pt s/p 2/16 R hip anterior hip hemiarthroplasty and closed reduction and splinting of R distal radius.  Transferred 2/22 to CCU d/t post op hyponatremia.  PMH includes dementia, a-fib on Eliquis, CHF s/p pacemaker, PAD.    PT Comments    Pt was long sitting in bed with daughters x 2 in room. She is alert and pleasantly confused. Was able to consistently follow commands with increased time. Per daughter," she got very agitated again last evening." Pt was cooperative and willing to perform OOB activity. She required mod assist to achieve EOB sitting. Stood to platform RW with min assist for elevated bed height. Ambulated ~ 100 ft with CGA however several occasions of min assist to prevent LOB. Pt tends to ambulate with narrow BOS and struggles with turning. Will continue to follow and progress as able per POC. PT continues to recommend DC to SNF to address deficits while improving safety with ADL.    Follow Up Recommendations  SNF     Equipment Recommendations  Wheelchair (measurements PT);Wheelchair cushion (measurements PT);Other (comment) (pt has platform RW in room)    Recommendations for Other Services       Precautions / Restrictions Precautions Precautions: Fall;Anterior Hip Precaution Booklet Issued: Yes (comment) Restrictions Weight Bearing Restrictions: Yes RUE Weight Bearing: Weight bear through elbow only RLE Weight Bearing: Weight bearing as tolerated    Mobility  Bed Mobility Overal bed mobility: Needs Assistance Bed Mobility: Supine to Sit     Supine to sit: Mod assist;HOB  elevated     General bed mobility comments: Mod assist to exit R sid eof bed withheavy use of draw pad. pt attempts to use RUE throughout session but was able to keep NWB status with cueing.    Transfers Overall transfer level: Needs assistance Equipment used: Right platform walker Transfers: Sit to/from Stand Sit to Stand: Min assist;Mod assist;From elevated surface         General transfer comment: Pt struggles with sequencing and needs constant cues for RUE placement  Ambulation/Gait Ambulation/Gait assistance: Min assist;Min guard Gait Distance (Feet): 100 Feet Assistive device: Rolling walker (2 wheeled) Gait Pattern/deviations: Step-to pattern;Antalgic;Trunk flexed Gait velocity: decreased   General Gait Details: pt mostly requires CGA however occasional MIn assist due to scissoring/narrow BOS with slight unsteadiness. Pt is easily distracted but overall tolerated well       Balance Overall balance assessment: Needs assistance Sitting-balance support: No upper extremity supported;Feet supported Sitting balance-Leahy Scale: Good Sitting balance - Comments: no LOB sitting EOB/in recliner   Standing balance support: Bilateral upper extremity supported;During functional activity Standing balance-Leahy Scale: Fair Standing balance comment: pt is high fall risk with dynamic balance. fair static standing balance         Cognition Arousal/Alertness: Awake/alert Behavior During Therapy: WFL for tasks assessed/performed Overall Cognitive Status: History of cognitive impairments - at baseline    General Comments: pt is alert and pleasantly confused. per family, still having sever sundowning with aggitation.         General Comments General comments (skin integrity, edema, etc.): discussed SNF  versus rehab with family. Pt's family still requesting DC to SNF      Pertinent Vitals/Pain Pain Assessment: No/denies pain Pain Score: 0-No pain Pain Location: R  hip/thigh Pain Descriptors / Indicators: Grimacing;Operative site guarding Pain Intervention(s): Limited activity within patient's tolerance;Monitored during session;Premedicated before session           PT Goals (current goals can now be found in the care plan section) Acute Rehab PT Goals Patient Stated Goal: return home to be with pup Progress towards PT goals: Progressing toward goals    Frequency    7X/week      PT Plan Current plan remains appropriate       AM-PAC PT "6 Clicks" Mobility   Outcome Measure  Help needed turning from your back to your side while in a flat bed without using bedrails?: A Little Help needed moving from lying on your back to sitting on the side of a flat bed without using bedrails?: A Lot Help needed moving to and from a bed to a chair (including a wheelchair)?: A Lot Help needed standing up from a chair using your arms (e.g., wheelchair or bedside chair)?: A Lot Help needed to walk in hospital room?: A Lot Help needed climbing 3-5 steps with a railing? : A Lot 6 Click Score: 13    End of Session Equipment Utilized During Treatment: Gait belt Activity Tolerance: Patient tolerated treatment well;Patient limited by fatigue Patient left: in chair;with call bell/phone within reach;with chair alarm set;with family/visitor present Nurse Communication: Mobility status;Precautions;Weight bearing status PT Visit Diagnosis: Other abnormalities of gait and mobility (R26.89);Muscle weakness (generalized) (M62.81);History of falling (Z91.81);Difficulty in walking, not elsewhere classified (R26.2);Pain Pain - Right/Left: Right Pain - part of body: Hip     Time: 1660-6301 PT Time Calculation (min) (ACUTE ONLY): 16 min  Charges:  $Gait Training: 8-22 mins                     Jetta Lout PTA 07/04/20, 12:03 PM

## 2020-07-05 DIAGNOSIS — S72001A Fracture of unspecified part of neck of right femur, initial encounter for closed fracture: Secondary | ICD-10-CM | POA: Diagnosis not present

## 2020-07-05 LAB — CBC
HCT: 24.6 % — ABNORMAL LOW (ref 36.0–46.0)
Hemoglobin: 8.2 g/dL — ABNORMAL LOW (ref 12.0–15.0)
MCH: 29 pg (ref 26.0–34.0)
MCHC: 33.3 g/dL (ref 30.0–36.0)
MCV: 86.9 fL (ref 80.0–100.0)
Platelets: 339 10*3/uL (ref 150–400)
RBC: 2.83 MIL/uL — ABNORMAL LOW (ref 3.87–5.11)
RDW: 13.5 % (ref 11.5–15.5)
WBC: 10.6 10*3/uL — ABNORMAL HIGH (ref 4.0–10.5)
nRBC: 0 % (ref 0.0–0.2)

## 2020-07-05 LAB — BASIC METABOLIC PANEL
Anion gap: 4 — ABNORMAL LOW (ref 5–15)
BUN: 24 mg/dL — ABNORMAL HIGH (ref 8–23)
CO2: 28 mmol/L (ref 22–32)
Calcium: 8.1 mg/dL — ABNORMAL LOW (ref 8.9–10.3)
Chloride: 94 mmol/L — ABNORMAL LOW (ref 98–111)
Creatinine, Ser: 0.53 mg/dL (ref 0.44–1.00)
GFR, Estimated: >60 mL/min (ref >60)
Glucose, Bld: 93 mg/dL (ref 70–99)
Potassium: 4 mmol/L (ref 3.5–5.1)
Sodium: 126 mmol/L — ABNORMAL LOW (ref 135–145)

## 2020-07-05 LAB — SARS CORONAVIRUS 2 (TAT 6-24 HRS): SARS Coronavirus 2: NEGATIVE

## 2020-07-05 MED ORDER — FUROSEMIDE 20 MG PO TABS
10.0000 mg | ORAL_TABLET | Freq: Every day | ORAL | Status: DC
  Administered 2020-07-05 – 2020-07-06 (×2): 10 mg via ORAL
  Filled 2020-07-05 (×3): qty 1

## 2020-07-05 MED ORDER — SODIUM CHLORIDE 1 G PO TABS
1.0000 g | ORAL_TABLET | Freq: Two times a day (BID) | ORAL | Status: DC
  Administered 2020-07-05 – 2020-07-06 (×3): 1 g via ORAL
  Filled 2020-07-05 (×4): qty 1

## 2020-07-05 NOTE — Progress Notes (Signed)
PROGRESS NOTE    Kristen Osborn  HCW:[id number] DOB: 18-Jan-1927 DOA: 06/24/2020 PCP: Danella Penton, MD   Brief Narrative: Taken from prior notes. Kristen Osborn a 87 y.o.femalewith medical history significant foratrial fibrillation on Eliquis, diastolic heart failure, dementia, HTN, PAD pacemaker placement, was brought to the emergency room following an accidental fall.:X-ray right hip: Displaced right femoral neck fracture s/p ORIF on 06/28/2020 with Dr. Odis Luster.  Tolerated the procedure well.  On 06/26/2020 Foley catheter was attempted and during advancement it emerged out of the vagina, there was some concern of fistula, which will need outpatient evaluation.  Patient is not a candidate for any advanced surgeries. Per daughter patient had issues with Foley placement in the past.  Developed postoperative hyponatremia, not responding to IV fluid so she was transferred to ICU for hypertonic saline and nephrology was consulted. Transferred out of ICU as sodium improved with hypertonic saline.  Patient was given voiding trial on 07/02/2020 which she failed, Foley was reinserted.  Patient needs to be discharged with Foley catheter and follow-up with urology as an outpatient.  Family wants her to go to [facility] and there is no bed available till Monday.  Covid test ordered today.  Subjective: Patient has no new complaint when seen today.  Overall seems improving.  Daughter at bedside.  Pain also appears well controlled.  Assessment & Plan:   Principal Problem:   Closed displaced fracture of right femoral neck (HCC) Active Problems:   Alzheimer's dementia without behavioral disturbance (HCC)   Atrial fibrillation and flutter (HCC)   Benign essential HTN   PAD (peripheral artery disease) (HCC)   Status post placement of cardiac pacemaker   Chronic anticoagulation   Preoperative clearance   Distal radius fracture, right   Protein-calorie malnutrition, severe  Right femoral  neck and distal right radius fracture secondary to mechanical fall.  S/p hemiarthroplasty and reduction of her right wrist. She was taken to the OR on 06/28/2020 as she was on Eliquis at home, waited few days for Eliquis washout. PT is recommending SNF placement-family wants her to go to [facility], no bed available till Monday. Patient with declining health, worsening dementia and no recent hip fracture, and failure to thrive.  High risk for deterioration and death. -Palliative care consult-family wants to continue current level of care, but status changed to DNR. -Continue with physical therapy. -Eliquis was restarted postoperatively. -She will follow-up with orthopedic surgery as an outpatient on 3/7 or 8 for staple removal.  Hyponatremia.  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  Required hypertonic saline and ICU. Sodium restarted trending down today after staying stable for few days. Discussed with nephrology and we will add salt along with low-dose Lasix. -Continue to monitor -Encourage p.o. intake  Atrial fibrillation and flutter.  Patient with RVR while in ED which resolved with one-time dose of IV diltiazem.  Currently heart rate controlled Her home dose of Eliquis was held initially and resumed postoperatively. 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 Concern of fistula per nursing staff as Foley catheter imaged out of vagina on progression.  Urology was consulted and they are recommending outpatient follow-up. According to urology recommendation Foley catheter should remain in for at least 1 week and voiding trial should be obtained after constipation resolved, constipation resolved today. In addition to the above, it sounds based on her history, that she may have some urethral atrophy versus kinking versus stenosis or perhaps that urethra  is somewhat ectopic as a result in  that the catheter may have just simply coiled in the vagina rather than the possibility of a vesicovaginal fistula. Either way, we will not plan for any further work-up of this at this time. No need for further imaging. -Failed voiding trial  requiring reinsertion of Foley catheter, will be discharged with Foley catheter and follow-up with urology as an outpatient.  Alzheimer's dementia without behavioral disturbance (HCC) -Continue Haldol as needed behavioral disturbance -continue quetiapine  Essential HTN.  Blood pressure mildly elevated. -Continue home dose of losartan and beta-blocker.  History of cardiac pacemaker.  No acute concern.  Acute blood loss anemia.  Secondary to surgery.  Hemoglobin at 9.3.  No other obvious bleeding.  Patient is on Eliquis. Anemia panel more consistent with anemia of chronic disease. -Start her on low-dose iron supplement. -Monitor hemoglobin-if continues to decrease we will image her for any possible hematoma.  Objective: Vitals:   07/05/20 0050 07/05/20 0600 07/05/20 0841 07/05/20 1201  BP: (!) 149/90 (!) 141/85 (!) 155/79 128/75  Pulse: 79 89 67 77  Resp: 16 16 18 16   Temp: 97.9 F (36.6 C) (!) 97.2 F (36.2 C) 97.8 F (36.6 C) 98 F (36.7 C)  TempSrc:   Oral Oral  SpO2: 95% 93% 95% 96%  Weight:      Height:        Intake/Output Summary (Last 24 hours) at 07/05/2020 1449 Last data filed at 07/05/2020 1036 Gross per 24 hour  Intake --  Output 1050 ml  Net -1050 ml   Filed Weights   06/24/20 2319 06/25/20 0411  Weight: 45.4 kg 44.6 kg    Examination:  General.  Frail elderly lady, in no acute distress. Pulmonary.  Lungs clear bilaterally, normal respiratory effort. CV.  Regular rate and rhythm, no JVD, rub or murmur. Abdomen.  Soft, nontender, nondistended, BS positive. CNS.  Alert and oriented .  No focal neurologic deficit. Extremities.  No edema, no cyanosis, pulses intact and symmetrical.  Right upper extremity with Ace  wrap. Psychiatry.  Judgment and insight appears normal.  DVT prophylaxis: Eliquis Code Status: Full Family Communication: Daughter was updated at bedside. Disposition Plan:  Status is: Inpatient  Remains inpatient appropriate because:Inpatient level of care appropriate due to severity of illness   Dispo: The patient is from: Home              Anticipated d/c is to: SNF              Anticipated d/c date is: 1 day.              Patient currently is medically stable.   Difficult to place patient No              Level of care: Med-Surg  All the records are reviewed and case discussed with Care Management/Social Worker. Management plans discussed with the patient, nursing and they are in agreement.  Consultants:   Nephrology  Orthopedic surgery  Procedures:  Antimicrobials:   Data Reviewed: I have personally reviewed following labs and imaging studies  CBC: Recent Labs  Lab 06/29/20 0349 06/30/20 0449 07/01/20 0129 07/03/20 0654 07/05/20 0532  WBC 12.6* 13.1* 12.4* 9.7 10.6*  HGB 11.0* 10.3* 10.3* 9.3* 8.2*  HCT 31.7* 30.1* 30.1* 27.0* 24.6*  MCV 85.0 87.2 87.0 86.8 86.9  PLT 177 186 193 287 339   Basic Metabolic Panel: Recent Labs  Lab 06/30/20 0449 06/30/20 1609 07/01/20 0129 07/01/20 1053 07/02/20  [id number] 07/02/20 2356 07/03/20 0654 07/04/20 0514 07/05/20 0532  NA 125*   < > 124*   < > 131* 131* 131* 130* 126*  K 3.8  --  3.9  --   --   --  4.1 4.0 4.0  CL 90*  --  90*  --   --   --  97* 96* 94*  CO2 29  --  29  --   --   --  26 27 28   GLUCOSE 199*  --  136*  --   --   --  119* 110* 93  BUN 35*  --  36*  --   --   --  34* 26* 24*  CREATININE 0.81  --  0.69  --   --   --  0.62 0.57 0.53  CALCIUM 8.5*  --  8.1*  --   --   --  8.3* 8.3* 8.1*   < > = values in this interval not displayed.   GFR: Estimated Creatinine Clearance: 32.2 mL/min (by C-G formula based on SCr of 0.53 mg/dL). Liver Function Tests: No results for input(s): AST, ALT, ALKPHOS,  BILITOT, PROT, ALBUMIN in the last 168 hours. No results for input(s): LIPASE, AMYLASE in the last 168 hours. No results for input(s): AMMONIA in the last 168 hours. Coagulation Profile: No results for input(s): INR, PROTIME in the last 168 hours. Cardiac Enzymes: No results for input(s): CKTOTAL, CKMB, CKMBINDEX, TROPONINI in the last 168 hours. BNP (last 3 results) No results for input(s): PROBNP in the last 8760 hours. HbA1C: No results for input(s): HGBA1C in the last 72 hours. CBG: Recent Labs  Lab 06/30/20 2053  GLUCAP 152*   Lipid Profile: No results for input(s): CHOL, HDL, LDLCALC, TRIG, CHOLHDL, LDLDIRECT in the last 72 hours. Thyroid Function Tests: No results for input(s): TSH, T4TOTAL, FREET4, T3FREE, THYROIDAB in the last 72 hours. Anemia Panel: Recent Labs    07/03/20 0654 07/03/20 0813  VITAMINB12  --  2,816*  FOLATE 11.5  --   FERRITIN 473*  --   TIBC 171*  --   IRON 29  --   RETICCTPCT 3.3*  --    Sepsis Labs: No results for input(s): PROCALCITON, LATICACIDVEN in the last 168 hours.  No results found for this or any previous visit (from the past 240 hour(s)).   Radiology Studies: No results found.  Scheduled Meds: . apixaban  2.5 mg Oral BID  . bisacodyl  10 mg Oral Daily  . Chlorhexidine Gluconate Cloth  6 each Topical Daily  . docusate sodium  100 mg Oral BID  . epoetin (EPOGEN/PROCRIT) injection  3,000 Units Subcutaneous Q14 Days  . feeding supplement  237 mL Oral TID BM  . ferrous sulfate  325 mg Oral Q breakfast  . furosemide  10 mg Oral Daily  . lidocaine  1 patch Transdermal Q24H  . losartan  25 mg Oral Daily  . metoprolol tartrate  25 mg Oral Q6H  . multivitamin with minerals  1 tablet Oral Daily  . pantoprazole  40 mg Oral Daily  . polyethylene glycol  17 g Oral BID  . QUEtiapine  25 mg Oral QHS  . senna-docusate  2 tablet Oral BID  . sodium chloride  1 g Oral BID WC   Continuous Infusions:    LOS: 10 days   Time spent: 25  minutes. More than 50% of the time was spent in counseling/coordination of care  Arnetha CourserSumayya Aniyah Nobis, MD [facility]  If 7PM-7AM, please contact night-coverage [email or web address]  07/05/2020, 2:49 PM   This record has been created using Conservation officer, historic buildings. Errors have been sought and corrected,but may not always be located. Such creation errors do not reflect on the standard of care.

## 2020-07-05 NOTE — Progress Notes (Signed)
Subjective: 7 Days Post-Op Procedure(s) (LRB): ARTHROPLASTY BIPOLAR HIP (HEMIARTHROPLASTY) (Right) CLOSED REDUCTION WRIST (Right)   Patient is bright and alert.  Sitting up in bed.  Moving legs well.  Alignment is good.  Dressings are dry.  Hemoglobin stable yesterday.  Patient reports pain as mild.  Objective:   VITALS:   Vitals:   07/05/20 0841 07/05/20 1201  BP: (!) 155/79 128/75  Pulse: 67 77  Resp: 18 16  Temp: 97.8 F (36.6 C) 98 F (36.7 C)  SpO2: 95% 96%    Neurologically intact Dorsiflexion/Plantar flexion intact Incision: dressing C/D/I  LABS Recent Labs    07/03/20 0654 07/05/20 0532  HGB 9.3* 8.2*  HCT 27.0* 24.6*  WBC 9.7 10.6*  PLT 287 339    Recent Labs    07/03/20 0654 07/04/20 0514 07/05/20 0532  NA 131* 130* 126*  K 4.1 4.0 4.0  BUN 34* 26* 24*  CREATININE 0.62 0.57 0.53  GLUCOSE 119* 110* 93    No results for input(s): LABPT, INR in the last 72 hours.   Assessment/Plan: 7 Days Post-Op Procedure(s) (LRB): ARTHROPLASTY BIPOLAR HIP (HEMIARTHROPLASTY) (Right) CLOSED REDUCTION WRIST (Right)   Up with therapy Discharge to SNF

## 2020-07-05 NOTE — Progress Notes (Signed)
[facility] [place], [place] 07/05/20  Subjective:   LOS: 10  Kristen Osborn is a 87 y.o. female with past medical history of Atrial Fibrillation, CHF and Dementia. She presents to the ED after a fall. She has underwent a right hip anterior hemiarthroplasty. We were consulted for hyponatremia.  Patient resting comfortably in the bed. Patient offers no specific physical complaints   Objective:  Vital signs in last 24 hours:  Temp:  [97.2 F (36.2 C)-98.5 F (36.9 C)] 97.8 F (36.6 C) (02/27 0841) Pulse Rate:  [67-93] 67 (02/27 0841) Resp:  [16-18] 18 (02/27 0841) BP: (122-155)/(65-90) 155/79 (02/27 0841) SpO2:  [93 %-99 %] 95 % (02/27 0841)  Weight change:  Filed Weights   06/24/20 2319 06/25/20 0411  Weight: 45.4 kg 44.6 kg    Intake/Output:    Intake/Output Summary (Last 24 hours) at 07/05/2020 1129 Last data filed at 07/05/2020 1036 Gross per 24 hour  Intake 480 ml  Output 1050 ml  Net -570 ml   Physical Exam: General:  No acute distress, sitting up in chair, frail, elderly  HEENT  anicteric, moist oral mucous membrane  Pulm/lungs  normal breathing effort, lungs are clear  CVS/Heart  irregular rhythm, no rub or gallop  Abdomen:   Soft, nontender  Extremities:  No peripheral edema, Rt arm in splint  Neurologic:   Alert and able to answer simple questions  Skin:  No acute rashes    Basic Metabolic Panel:  Recent Labs  Lab 06/30/20 0449 06/30/20 1609 07/01/20 0129 07/01/20 1053 07/02/20 0535 07/02/20 2356 07/03/20 0654 07/04/20 0514 07/05/20 0532  NA 125*   < > 124*   < > 131* 131* 131* 130* 126*  K 3.8  --  3.9  --   --   --  4.1 4.0 4.0  CL 90*  --  90*  --   --   --  97* 96* 94*  CO2 29  --  29  --   --   --  26 27 28   GLUCOSE 199*  --  136*  --   --   --  119* 110* 93  BUN 35*  --  36*  --   --   --  34* 26* 24*  CREATININE 0.81  --  0.69  --   --   --  0.62 0.57 0.53  CALCIUM 8.5*  --  8.1*  --   --   --  8.3* 8.3* 8.1*   <  > = values in this interval not displayed.     CBC: Recent Labs  Lab 06/29/20 0349 06/30/20 0449 07/01/20 0129 07/03/20 0654 07/05/20 0532  WBC 12.6* 13.1* 12.4* 9.7 10.6*  HGB 11.0* 10.3* 10.3* 9.3* 8.2*  HCT 31.7* 30.1* 30.1* 27.0* 24.6*  MCV 85.0 87.2 87.0 86.8 86.9  PLT 177 186 193 287 339     No results found for: HEPBSAG, HEPBSAB, HEPBIGM    Microbiology:  No results found for this or any previous visit (from the past 240 hour(s)).  Coagulation Studies: No results for input(s): LABPROT, INR in the last 72 hours.  Urinalysis: No results for input(s): COLORURINE, LABSPEC, PHURINE, GLUCOSEU, HGBUR, BILIRUBINUR, KETONESUR, PROTEINUR, UROBILINOGEN, NITRITE, LEUKOCYTESUR in the last 72 hours.  Invalid input(s): APPERANCEUR    Imaging: No results found.   Medications:    . apixaban  2.5 mg Oral BID  . bisacodyl  10 mg Oral Daily  . Chlorhexidine Gluconate Cloth  6 each Topical Daily  .  docusate sodium  100 mg Oral BID  . epoetin (EPOGEN/PROCRIT) injection  3,000 Units Subcutaneous Q14 Days  . feeding supplement  237 mL Oral TID BM  . ferrous sulfate  325 mg Oral Q breakfast  . furosemide  10 mg Oral Daily  . lidocaine  1 patch Transdermal Q24H  . losartan  25 mg Oral Daily  . metoprolol tartrate  25 mg Oral Q6H  . multivitamin with minerals  1 tablet Oral Daily  . pantoprazole  40 mg Oral Daily  . polyethylene glycol  17 g Oral BID  . QUEtiapine  25 mg Oral QHS  . senna-docusate  2 tablet Oral BID  . sodium chloride  1 g Oral BID WC   hydrALAZINE, HYDROcodone-acetaminophen, liver oil-zinc oxide, metoCLOPramide **OR** metoCLOPramide (REGLAN) injection, morphine injection, ondansetron **OR** ondansetron (ZOFRAN) IV  Assessment/ Plan:  87 y.o. female with atrial fibrillation requiring anticoagulation with Eliquis, diastolic CHF, dementia, hypertension, pacemaker, was admitted on 06/24/2020 for  Principal Problem:   Closed displaced fracture of right  femoral neck (HCC) Active Problems:   Alzheimer's dementia without behavioral disturbance (HCC)   Atrial fibrillation and flutter (HCC)   Benign essential HTN   PAD (peripheral artery disease) (HCC)   Status post placement of cardiac pacemaker   Chronic anticoagulation   Preoperative clearance   Distal radius fracture, right   Protein-calorie malnutrition, severe  Closed right hip fracture (HCC) [S72.001A] Fall, initial encounter [W19.XXXA] Closed displaced fracture of right femoral neck (HCC) [S72.001A] Closed fracture of distal end of right radius, unspecified fracture morphology, initial encounter [S52.501A]  1) Hyponatremia  -Patient has low sodium secondary to Postop -SIADH -Pt sodium was at 130--131, now low again. Results for Kristen Osborn (MRN [id number]) as of 07/05/2020 11:21  Ref. Range 07/03/2020 06:54 07/03/2020 08:13 07/04/2020 05:14 07/05/2020 05:32 07/05/2020 10:08  Sodium Latest Ref Range: 135 - 145 mmol/L 131 (L)  130 (L) 126 (L)      We will start patient on salt tablets and Lasix   2) Anemia of chronic disease  Iron saturation at goal Vitamin b 12- at goal  Results for Kristen Osborn (MRN [id number]) as of 07/04/2020 10:45  Ref. Range 07/03/2020 06:54 07/03/2020 08:13  Iron Latest Ref Range: 28 - 170 ug/dL 29   UIBC Latest Units: ug/dL 062   TIBC Latest Ref Range: 250 - 450 ug/dL 376 (L)   Saturation Ratios Latest Ref Range: 10.4 - 31.8 % 17   Ferritin Latest Ref Range: 11 - 307 ng/mL 473 (H)   Folate Latest Ref Range: >5.9 ng/mL 11.5   Vitamin B12 Latest Ref Range: 180 - 914 pg/mL  2,816 (H)    Patient now on Epogen   3) hypertension Patient blood pressure goal for her age  80) dementia Primary is following   Plan  We will start patient on salt tablets and Lasix    LOS: 10 Kristen Osborn s [facility] 2/27/202211:29 AM  [facility] [place], [place] [phone]

## 2020-07-05 NOTE — Progress Notes (Addendum)
Physical Therapy Treatment Patient Details Name: Kristen Osborn MRN: [id number] DOB: 12-20-1926 Today's Date: 07/05/2020    History of Present Illness Pt is a 87 y.o. female presenting to hospital 2/16 s/p fall at home c/o R hip and R hand pain.  Pt admitted with closed displaced fx of R femoral neck, distal radius fx R, a-fib and a-flutter with RVR in ER; pt also noted with urinary retention.  Pt s/p 2/16 R hip anterior hip hemiarthroplasty and closed reduction and splinting of R distal radius.  Transferred 2/22 to CCU d/t post op hyponatremia.  PMH includes dementia, a-fib on Eliquis, CHF s/p pacemaker, PAD.    PT Comments    Pt ready for session.  Stating she needs to use bathroom.  Reminded she has catheter.  Denies need for BM.  To EOB with mod a x 1 and cues to not use RUE.  Steady in sitting.  Stood with mod a x 1 to platform walker.  She takes several unsteady steps with walker and is redirected to sit on bed for +2 assist for safety. She is able to progress gait 40' today with min/mod a x 1 to remain upright and to position walker.  Overall poor gait quality and high fall risk today requiring recliner follow.   Given performance today, SNF remains appropriate for discharge as daughter would have a difficult time managing her at home without significant consistent improvement.   Follow Up Recommendations  SNF     Equipment Recommendations  Wheelchair (measurements PT);Wheelchair cushion (measurements PT);Other (comment)    Recommendations for Other Services       Precautions / Restrictions Precautions Precautions: Fall;Anterior Hip Precaution Booklet Issued: Yes (comment) Restrictions Weight Bearing Restrictions: Yes RUE Weight Bearing: Weight bear through elbow only RLE Weight Bearing: Weight bearing as tolerated    Mobility  Bed Mobility Overal bed mobility: Needs Assistance Bed Mobility: Supine to Sit     Supine to sit: Mod assist;HOB elevated           Transfers Overall transfer level: Needs assistance Equipment used: Right platform walker Transfers: Sit to/from Stand Sit to Stand: Mod assist;From elevated surface            Ambulation/Gait Ambulation/Gait assistance: Min assist;Mod assist Gait Distance (Feet): 40 Feet Assistive device: Right platform walker Gait Pattern/deviations: Step-to pattern;Antalgic;Trunk flexed Gait velocity: decreased   General Gait Details: increased assist today with min.mod a x 1 to remain upright and position walker for effective use   Stairs             Wheelchair Mobility    Modified Rankin (Stroke Patients Only)       Balance Overall balance assessment: Needs assistance Sitting-balance support: No upper extremity supported;Feet supported Sitting balance-Leahy Scale: Good     Standing balance support: Bilateral upper extremity supported;During functional activity Standing balance-Leahy Scale: Poor Standing balance comment: pt is high fall risk                            Cognition Arousal/Alertness: Awake/alert Behavior During Therapy: WFL for tasks assessed/performed Overall Cognitive Status: History of cognitive impairments - at baseline                                        Exercises      General Comments  Pertinent Vitals/Pain Pain Assessment: Faces Faces Pain Scale: Hurts little more Pain Location: R hip/thigh Pain Descriptors / Indicators: Grimacing;Operative site guarding Pain Intervention(s): Limited activity within patient's tolerance;Monitored during session;Repositioned    Home Living                      Prior Function            PT Goals (current goals can now be found in the care plan section) Progress towards PT goals: Progressing toward goals    Frequency    7X/week      PT Plan Current plan remains appropriate    Co-evaluation              AM-PAC PT "6 Clicks" Mobility    Outcome Measure  Help needed turning from your back to your side while in a flat bed without using bedrails?: A Little Help needed moving from lying on your back to sitting on the side of a flat bed without using bedrails?: A Lot Help needed moving to and from a bed to a chair (including a wheelchair)?: A Lot Help needed standing up from a chair using your arms (e.g., wheelchair or bedside chair)?: A Lot Help needed to walk in hospital room?: A Lot Help needed climbing 3-5 steps with a railing? : Total 6 Click Score: 12    End of Session Equipment Utilized During Treatment: Gait belt Activity Tolerance: Patient tolerated treatment well;Patient limited by fatigue Patient left: in chair;with call bell/phone within reach;with chair alarm set;with family/visitor present Nurse Communication: Mobility status;Precautions;Weight bearing status Pain - Right/Left: Right Pain - part of body: Hip     Time: 7253-6644 PT Time Calculation (min) (ACUTE ONLY): 24 min  Charges:  $Gait Training: 8-22 mins $Therapeutic Exercise: 8-22 mins                    Danielle Dess, PTA 07/05/20, 10:03 AM

## 2020-07-06 DIAGNOSIS — S72001A Fracture of unspecified part of neck of right femur, initial encounter for closed fracture: Secondary | ICD-10-CM | POA: Diagnosis not present

## 2020-07-06 LAB — BASIC METABOLIC PANEL
Anion gap: 5 (ref 5–15)
BUN: 25 mg/dL — ABNORMAL HIGH (ref 8–23)
CO2: 28 mmol/L (ref 22–32)
Calcium: 8.1 mg/dL — ABNORMAL LOW (ref 8.9–10.3)
Chloride: 97 mmol/L — ABNORMAL LOW (ref 98–111)
Creatinine, Ser: 0.58 mg/dL (ref 0.44–1.00)
GFR, Estimated: >60 mL/min (ref >60)
Glucose, Bld: 103 mg/dL — ABNORMAL HIGH (ref 70–99)
Potassium: 4 mmol/L (ref 3.5–5.1)
Sodium: 130 mmol/L — ABNORMAL LOW (ref 135–145)

## 2020-07-06 LAB — CBC
HCT: 25.3 % — ABNORMAL LOW (ref 36.0–46.0)
Hemoglobin: 8.6 g/dL — ABNORMAL LOW (ref 12.0–15.0)
MCH: 29.5 pg (ref 26.0–34.0)
MCHC: 34 g/dL (ref 30.0–36.0)
MCV: 86.6 fL (ref 80.0–100.0)
Platelets: 377 10*3/uL (ref 150–400)
RBC: 2.92 MIL/uL — ABNORMAL LOW (ref 3.87–5.11)
RDW: 13.8 % (ref 11.5–15.5)
WBC: 10.3 10*3/uL (ref 4.0–10.5)
nRBC: 0 % (ref 0.0–0.2)

## 2020-07-06 MED ORDER — FERROUS SULFATE 325 (65 FE) MG PO TABS: 325.0000 mg | ORAL_TABLET | Freq: Every day | ORAL | 3 refills | Status: DC

## 2020-07-06 MED ORDER — LIDOCAINE 5 % EX PTCH: 1.0000 | MEDICATED_PATCH | CUTANEOUS | 0 refills | Status: DC

## 2020-07-06 MED ORDER — ENSURE ENLIVE PO LIQD: 237.0000 mL | Freq: Three times a day (TID) | ORAL | 12 refills | Status: DC

## 2020-07-06 MED ORDER — FUROSEMIDE 20 MG PO TABS: 10.0000 mg | ORAL_TABLET | Freq: Every day | ORAL | Status: DC

## 2020-07-06 MED ORDER — ADULT MULTIVITAMIN W/MINERALS CH: 1.0000 | ORAL_TABLET | Freq: Every day | ORAL | Status: DC

## 2020-07-06 MED ORDER — APIXABAN 2.5 MG PO TABS: 2.5000 mg | ORAL_TABLET | Freq: Two times a day (BID) | ORAL | Status: DC

## 2020-07-06 MED ORDER — ZINC OXIDE 40 % EX OINT: TOPICAL_OINTMENT | CUTANEOUS | 0 refills | Status: DC | PRN

## 2020-07-06 MED ORDER — HYDROCODONE-ACETAMINOPHEN 5-325 MG PO TABS: 1.0000 | ORAL_TABLET | Freq: Four times a day (QID) | ORAL | 0 refills | Status: DC | PRN

## 2020-07-06 MED ORDER — POLYETHYLENE GLYCOL 3350 17 G PO PACK: 17.0000 g | PACK | Freq: Every day | ORAL | 0 refills | Status: DC | PRN

## 2020-07-06 MED ORDER — METOPROLOL TARTRATE 25 MG PO TABS: 25.0000 mg | ORAL_TABLET | Freq: Four times a day (QID) | ORAL | Status: DC

## 2020-07-06 MED ORDER — DOCUSATE SODIUM 100 MG PO CAPS: 100.0000 mg | ORAL_CAPSULE | Freq: Two times a day (BID) | ORAL | 0 refills | Status: DC

## 2020-07-06 MED ORDER — EPOETIN ALFA 4000 UNIT/ML IJ SOLN: 3000.0000 [IU] | INTRAMUSCULAR | Status: DC

## 2020-07-06 MED ORDER — SODIUM CHLORIDE 1 G PO TABS: 1.0000 g | ORAL_TABLET | Freq: Every day | ORAL | Status: DC

## 2020-07-06 NOTE — Discharge Summary (Signed)
Physician Discharge Summary  Kristen Osborn WUJ:[id number] DOB: [birth year] DOA: 06/24/2020  PCP: Danella Penton, MD  Admit date: 06/24/2020 Discharge date: 07/06/2020  Admitted From: Home Disposition: SNF  Recommendations for Outpatient Follow-up:  1. Follow up with PCP in 1-2 weeks 2. Follow-up with orthopedic surgery 3. Follow-up with urology 4. Please obtain BMP/CBC in one week 5. Please follow up on the following pending results: None  Home Health: No Equipment/Devices: Rolling walker Discharge Condition: Stable CODE STATUS: DNR Diet recommendation:  Regular   Brief/Interim Summary: Kristen W Cookis a 87 y.o.femalewith medical history significant foratrial fibrillation on Eliquis, diastolic heart failure, dementia, HTN, PAD pacemaker placement, was brought to the emergency room following an accidental fall. Imaging positive for displaced right femoral neck fracture and a right wrist fracture.  She was taken to the OR with Dr. Odis Luster on 06/28/2020 for hemiarthroplasty and closed reduction of wrist fracture.  Patient tolerated the procedure well.  She will follow-up with orthopedic on March 7 or 8 for removal of staples.  She will continue with pain management as needed.  On 06/26/2020 Foley catheter was attempted and during advancement it emerged out of the vagina, there was some concern of fistula, which will need outpatient evaluation.  Patient is not a candidate for any advanced surgeries. Per daughter patient had issues with Foley placement in the past. Patient was given voiding trial on 07/02/2020 which she failed, Foley was reinserted.  Patient needs to be discharged with Foley catheter and follow-up with urology as an outpatient.  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  Initially requiring hypertonic saline.  Nephrology was consulted. Once sodium improved the hypertonic saline discontinued and patient was advised to use little extra  salt in her food.  Sodium started trending down again after remain stable for few days, she was started on salt tablets along with low-dose Lasix.  She can continue that on discharge and follow-up with her primary care provider for further recommendations.  Salt tablets can be discontinued once sodium remains stable.  Sodium on discharge was 130. She will need BMP twice a week initially till her sodium level stabilizes. Please stop Lasix when you discontinue salt tablets.  She also developed acute blood loss anemia with after surgery.  Hemoglobin nadired at 8.2, anemia panel was more consistent with anemia of chronic disease.  She was started on low-dose iron supplement along with EPO which she can take it every 14 days till her hemoglobin improved.  Patient has an history of atrial fibrillation/flutter.  She was in RVR on presentation initially requiring IV diltiazem.  Later rate is well controlled with metoprolol.  She was on full dose of Eliquis at home which were held initially for surgery for few days and resumed postoperatively at a lower days according to her age at 2.5 mg twice daily.  She also has cardiac pacemaker-no acute issues during current hospitalization and she will follow up with her cardiologist according to her schedule.  She will continue rest of her home meds and follow-up with her providers.  Discharge Diagnoses:  Principal Problem:   Closed displaced fracture of right femoral neck (HCC) Active Problems:   Alzheimer's dementia without behavioral disturbance (HCC)   Atrial fibrillation and flutter (HCC)   Benign essential HTN   PAD (peripheral artery disease) (HCC)   Status post placement of cardiac pacemaker   Chronic anticoagulation   Preoperative clearance   Distal radius fracture, right   Protein-calorie malnutrition, severe   Discharge  Instructions  Discharge Instructions    Diet - low sodium heart healthy   Complete by: As directed    Discharge instructions    Complete by: As directed    It was pleasure taking care of you. Continue taking your salt tablet once a day and follow-up with your primary care provider if your sodium level remains within normal limit you can discontinue. Add little salt to your diet. Keep yourself well-hydrated. Follow-up with your orthopedic surgeon next week.   Increase activity slowly   Complete by: As directed    No dressing needed   Complete by: As directed      Allergies as of 07/06/2020      Reactions   Lisinopril Other (See Comments)      Medication List    TAKE these medications   apixaban 2.5 MG Tabs tablet Commonly known as: ELIQUIS Take 1 tablet (2.5 mg total) by mouth 2 (two) times daily. What changed:   medication strength  how much to take   Cyanocobalamin 1000 MCG Lozg Place 1 lozenge under the tongue daily.   docusate sodium 100 MG capsule Commonly known as: COLACE Take 1 capsule (100 mg total) by mouth 2 (two) times daily.   epoetin alfa 4000 UNIT/ML injection Commonly known as: EPOGEN Inject 0.75 mLs (3,000 Units total) into the skin every 14 (fourteen) days. Start taking on: July 18, 2020   feeding supplement Liqd Take 237 mLs by mouth 3 (three) times daily between meals.   ferrous sulfate 325 (65 FE) MG tablet Take 1 tablet (325 mg total) by mouth daily with breakfast.   furosemide 20 MG tablet Commonly known as: LASIX Take 0.5 tablets (10 mg total) by mouth daily.   HYDROcodone-acetaminophen 5-325 MG tablet Commonly known as: NORCO/VICODIN Take 1-2 tablets by mouth every 6 (six) hours as needed for moderate pain.   lidocaine 5 % Commonly known as: LIDODERM Place 1 patch onto the skin daily. Remove & Discard patch within 12 hours or as directed by MD   liver oil-zinc oxide 40 % ointment Commonly known as: DESITIN Apply topically as needed for irritation.   losartan 25 MG tablet Commonly known as: COZAAR Take 25 mg by mouth daily.   metoprolol tartrate 25 MG  tablet Commonly known as: LOPRESSOR Take 1 tablet (25 mg total) by mouth every 6 (six) hours. What changed:   how much to take  when to take this   multivitamin with minerals Tabs tablet Take 1 tablet by mouth daily.   omeprazole 20 MG capsule Commonly known as: PRILOSEC Take 20 mg by mouth daily.   polyethylene glycol 17 g packet Commonly known as: MIRALAX / GLYCOLAX Take 17 g by mouth daily as needed for mild constipation or moderate constipation.   potassium chloride 8 MEQ tablet Commonly known as: KLOR-CON Take 8 mEq by mouth daily.   QUEtiapine 25 MG tablet Commonly known as: SEROQUEL Take 25 mg by mouth daily. Takes at 5 pm   sodium chloride 1 g tablet Take 1 tablet (1 g total) by mouth daily.            Durable Medical Equipment  (From admission, onward)         Start     Ordered   06/29/20 1540  For home use only DME Walker platform  Once       Question:  Patient needs a walker to treat with the following condition  Answer:  Wrist fracture, right   06/29/20  1541           Discharge Care Instructions  (From admission, onward)         Start     Ordered   07/06/20 0000  No dressing needed        07/06/20 1002          Contact information for follow-up providers    Danella Penton, MD. Schedule an appointment as soon as possible for a visit.   Specialty: Internal Medicine Contact information: [phone] [facility] [address] [facility] [place] [address] [phone]        Lyndle Herrlich, MD. Schedule an appointment as soon as possible for a visit in 1 week(s).   Specialty: Orthopedic Surgery Contact information: [address] [place] [address] [phone]        Vanna Scotland, MD Follow up in 1 week(s).   Specialty: Urology Contact information: [address] [place] [address] [phone]            Contact information for after-discharge care    Destination     [facility] .   Service: Skilled Nursing Contact information: [address] [place] [address] [phone]                 Allergies  Allergen Reactions  . Lisinopril Other (See Comments)    Consultations:  Surgery  Nephrology  Procedures/Studies: DG Wrist Complete Right  Result Date: 06/25/2020 CLINICAL DATA:  Post fall with right wrist deformity. EXAM: RIGHT WRIST - COMPLETE 3+ VIEW COMPARISON:  None. FINDINGS: Impacted displaced distal radial metaphysis fracture. There is apex volar angulation. There is no definite radiocarpal joint involvement. There is no fracture involvement of the distal radioulnar joint, however the joint is offset. Degenerative cystic changes in the ulna styloid. No carpal bone fracture. IMPRESSION: Impacted and displaced distal radial metaphysis fracture with apex volar angulation. Disruption of the distal radioulnar joint. Electronically Signed   By: Narda Rutherford M.D.   On: 06/25/2020 00:11   CT Head Wo Contrast  Result Date: 06/25/2020 CLINICAL DATA:  87 year old post fall tonight. EXAM: CT HEAD WITHOUT CONTRAST TECHNIQUE: Contiguous axial images were obtained from the base of the skull through the vertex without intravenous contrast. COMPARISON:  Head CT 11/01/2019 FINDINGS: Brain: Despite repeat acquisition, there is motion artifact limitations. Stable degree of generalized atrophy and chronic small vessel ischemia. No evidence of intracranial hemorrhage, mass effect, or midline shift. No hydrocephalus. The basilar cisterns are patent. No evidence of territorial infarct or acute ischemia. No extra-axial or intracranial fluid collection. Vascular: Atherosclerosis of skullbase vasculature without hyperdense vessel or abnormal calcification. Skull: No fracture or focal lesion. Sinuses/Orbits: No acute findings.  Bilateral cataract resection. Other: None.  IMPRESSION: 1. No acute intracranial abnormality. No skull fracture. 2. Stable atrophy and chronic small vessel ischemia. Electronically Signed   By: Narda Rutherford M.D.   On: 06/25/2020 01:10   CT Cervical Spine Wo Contrast  Result Date: 06/25/2020 CLINICAL DATA:  Neck trauma (Age >= 89y) 87 year old post fall. EXAM: CT CERVICAL SPINE WITHOUT CONTRAST TECHNIQUE: Multidetector CT imaging of the cervical spine was performed without intravenous contrast. Multiplanar CT image reconstructions were also generated. COMPARISON:  None. FINDINGS: Alignment: 4 mm anterolisthesis of C2 on C3, 3 mm anterolisthesis C3 on C4, 4 mm anterolisthesis C4 on C5, and 3 mm anterolisthesis C7 on T1. No traumatic subluxation, no jumped  or perched facets. Skull base and vertebrae: No acute fracture. The dens and skull base are intact. Soft tissues and spinal canal: No prevertebral fluid or swelling. No visible canal hematoma. Disc levels: Diffuse degenerative disc disease, most prominent at C5-C6 and C4-C5. There is multilevel facet hypertrophy. Upper chest: Apical septal thickening consistent with pulmonary edema. Other: Carotid calcifications. IMPRESSION: 1. Multilevel degenerative disc disease and facet hypertrophy throughout the cervical spine without acute fracture or subluxation. 2. Multilevel anterolisthesis, typically degenerative. 3. Pulmonary edema at the lung apices. Electronically Signed   By: Narda Rutherford M.D.   On: 06/25/2020 01:14   DG Chest Portable 1 View  Result Date: 06/25/2020 CLINICAL DATA:  Fall tonight with right wrist and right hip deformity. EXAM: PORTABLE CHEST 1 VIEW COMPARISON:  Radiograph 10/05/2011 FINDINGS: Single lead left-sided pacemaker in place. Cardiomegaly is grossly stable. Unchanged mediastinal contours. Dense aortic atherosclerosis. No focal airspace disease, pneumothorax or large pleural effusion. Soft tissue density projecting over the left lung base is unchanged from prior and likely  soft tissue attenuation. Mild vascular congestion. No acute osseous abnormalities are seen. IMPRESSION: 1. No acute traumatic process. 2. Mild vascular congestion. Stable cardiomegaly. Aortic Atherosclerosis (ICD10-I70.0). Electronically Signed   By: Narda Rutherford M.D.   On: 06/25/2020 00:14   DG Hand Complete Right  Result Date: 06/25/2020 CLINICAL DATA:  Fall tonight.  Right wrist deformity. EXAM: RIGHT HAND - COMPLETE 3+ VIEW COMPARISON:  None. FINDINGS: Distal radius fracture is better assessed on concurrent wrist exam. There is no additional fracture of the hand. There is ulnar subluxation of the fifth digit at the proximal interphalangeal joint that appears chronic. Multifocal osteoarthritis, primarily throughout the digits. Soft tissue prominence at the second through fourth proximal interphalangeal joints, likely Bouchard's nodes. IMPRESSION: 1. Distal radius fracture better assessed on concurrent wrist exam. No additional fracture of the hand. 2. Multifocal osteoarthritis, primarily throughout the digits. Electronically Signed   By: Narda Rutherford M.D.   On: 06/25/2020 00:10   DG HIP OPERATIVE UNILAT W OR W/O PELVIS RIGHT  Result Date: 06/28/2020 CLINICAL DATA:  Right hip replacement FLUOROSCOPY TIME:  3.2 seconds. Images: 2 EXAM: OPERATIVE RIGHT HIP (WITH PELVIS IF PERFORMED) 2 VIEWS TECHNIQUE: Fluoroscopic spot image(s) were submitted for interpretation post-operatively. COMPARISON:  June 24, 2020 FINDINGS: The patient is status post right hip replacement. Hardware is in good position. IMPRESSION: Right hip replacement as above. Electronically Signed   By: Gerome Sam III M.D   On: 06/28/2020 12:45   DG Hip Unilat W or Wo Pelvis 2-3 Views Right  Result Date: 06/25/2020 CLINICAL DATA:  Post fall with right hip deformity. EXAM: DG HIP (WITH OR WITHOUT PELVIS) 2-3V RIGHT COMPARISON:  None. FINDINGS: There is a displaced right femoral neck fracture. Proximal migration of the femoral  shaft. The femoral head remains seated. Pubic rami are intact. Bones are under mineralized. IMPRESSION: Displaced right femoral neck fracture. Electronically Signed   By: Narda Rutherford M.D.   On: 06/25/2020 00:12    Subjective: Patient was seen and examined.  Daughter at bedside.  No new complaint.  She was walking around the hallway with PT.  We discussed about keeping the sodium levels up and using salt tablets temporarily along with low-dose Lasix.  Discharge Exam: Vitals:   07/06/20 0817 07/06/20 0847  BP:  106/65  Pulse: (!) 106 (!) 106  Resp: (!) 21 19  Temp: 98.6 F (37 C) 98.3 F (36.8 C)  SpO2:  95%   Vitals:  07/06/20 0414 07/06/20 0800 07/06/20 0817 07/06/20 0847  BP: 133/69 132/76  106/65  Pulse: 99 81 (!) 106 (!) 106  Resp: 16 20 (!) 21 19  Temp: 97.9 F (36.6 C) 98.6 F (37 C) 98.6 F (37 C) 98.3 F (36.8 C)  TempSrc:   Oral   SpO2: 95% 93%  95%  Weight:      Height:        General: Pt is alert, awake, not in acute distress Cardiovascular: RRR, S1/S2 +, no rubs, no gallops Respiratory: CTA bilaterally, no wheezing, no rhonchi Abdominal: Soft, NT, ND, bowel sounds + Extremities: no edema, no cyanosis, right upper extremity with Ace wrap.   The results of significant diagnostics from this hospitalization (including imaging, microbiology, ancillary and laboratory) are listed below for reference.    Microbiology: Recent Results (from the past 240 hour(s))  SARS CORONAVIRUS 2 (TAT 6-24 HRS) Nasopharyngeal Nasopharyngeal Swab     Status: None   Collection Time: 07/05/20 10:08 AM   Specimen: Nasopharyngeal Swab  Result Value Ref Range Status   SARS Coronavirus 2 NEGATIVE NEGATIVE Final    Comment: (NOTE) SARS-CoV-2 target nucleic acids are NOT DETECTED.  The SARS-CoV-2 RNA is generally detectable in upper and lower respiratory specimens during the acute phase of infection. Negative results do not preclude SARS-CoV-2 infection, do not rule  out co-infections with other pathogens, and should not be used as the sole basis for treatment or other patient management decisions. Negative results must be combined with clinical observations, patient history, and epidemiological information. The expected result is Negative.  Fact Sheet for Patients: HairSlick.nohttps://www.fda.gov/media/138098/download  Fact Sheet for Healthcare Providers: quierodirigir.comhttps://www.fda.gov/media/138095/download  This test is not yet approved or cleared by the [place] FDA and  has been authorized for detection and/or diagnosis of SARS-CoV-2 by FDA under an Emergency Use Authorization (EUA). This EUA will remain  in effect (meaning this test can be used) for the duration of the COVID-19 declaration under Se ction 564(b)(1) of the Act, 21 U.S.C. section 360bbb-3(b)(1), unless the authorization is terminated or revoked sooner.  Performed at [facility], [address] [place], [place] [address]      Labs: BNP (last 3 results) No results for input(s): BNP in the last 8760 hours. Basic Metabolic Panel: Recent Labs  Lab 07/01/20 0129 07/01/20 1053 07/02/20 2356 07/03/20 0654 07/04/20 0514 07/05/20 0532 07/06/20 0404  NA 124*   < > 131* 131* 130* 126* 130*  K 3.9  --   --  4.1 4.0 4.0 4.0  CL 90*  --   --  97* 96* 94* 97*  CO2 29  --   --  26 27 28 28   GLUCOSE 136*  --   --  119* 110* 93 103*  BUN 36*  --   --  34* 26* 24* 25*  CREATININE 0.69  --   --  0.62 0.57 0.53 0.58  CALCIUM 8.1*  --   --  8.3* 8.3* 8.1* 8.1*   < > = values in this interval not displayed.   Liver Function Tests: No results for input(s): AST, ALT, ALKPHOS, BILITOT, PROT, ALBUMIN in the last 168 hours. No results for input(s): LIPASE, AMYLASE in the last 168 hours. No results for input(s): AMMONIA in the last 168 hours. CBC: Recent Labs  Lab 06/30/20 0449 07/01/20 0129 07/03/20 0654 07/05/20 0532 07/06/20 0404  WBC 13.1* 12.4* 9.7 10.6* 10.3  HGB 10.3* 10.3* 9.3*  8.2* 8.6*  HCT 30.1* 30.1*  27.0* 24.6* 25.3*  MCV 87.2 87.0 86.8 86.9 86.6  PLT 186 193 287 339 377   Cardiac Enzymes: No results for input(s): CKTOTAL, CKMB, CKMBINDEX, TROPONINI in the last 168 hours. BNP: Invalid input(s): POCBNP CBG: Recent Labs  Lab 06/30/20 2053  GLUCAP 152*   D-Dimer No results for input(s): DDIMER in the last 72 hours. Hgb A1c No results for input(s): HGBA1C in the last 72 hours. Lipid Profile No results for input(s): CHOL, HDL, LDLCALC, TRIG, CHOLHDL, LDLDIRECT in the last 72 hours. Thyroid function studies No results for input(s): TSH, T4TOTAL, T3FREE, THYROIDAB in the last 72 hours.  Invalid input(s): FREET3 Anemia work up No results for input(s): VITAMINB12, FOLATE, FERRITIN, TIBC, IRON, RETICCTPCT in the last 72 hours. Urinalysis    Component Value Date/Time   COLORURINE YELLOW (A) 06/25/2020 0350   APPEARANCEUR HAZY (A) 06/25/2020 0350   APPEARANCEUR Hazy 08/05/2011 1551   LABSPEC 1.013 06/25/2020 0350   LABSPEC 1.014 08/05/2011 1551   PHURINE 8.0 06/25/2020 0350   GLUCOSEU NEGATIVE 06/25/2020 0350   GLUCOSEU Negative 08/05/2011 1551   HGBUR SMALL (A) 06/25/2020 0350   BILIRUBINUR NEGATIVE 06/25/2020 0350   BILIRUBINUR Negative 08/05/2011 1551   KETONESUR NEGATIVE 06/25/2020 0350   PROTEINUR 30 (A) 06/25/2020 0350   NITRITE NEGATIVE 06/25/2020 0350   LEUKOCYTESUR NEGATIVE 06/25/2020 0350   LEUKOCYTESUR 3+ 08/05/2011 1551   Sepsis Labs Invalid input(s): PROCALCITONIN,  WBC,  LACTICIDVEN Microbiology Recent Results (from the past 240 hour(s))  SARS CORONAVIRUS 2 (TAT 6-24 HRS) Nasopharyngeal Nasopharyngeal Swab     Status: None   Collection Time: 07/05/20 10:08 AM   Specimen: Nasopharyngeal Swab  Result Value Ref Range Status   SARS Coronavirus 2 NEGATIVE NEGATIVE Final    Comment: (NOTE) SARS-CoV-2 target nucleic acids are NOT DETECTED.  The SARS-CoV-2 RNA is generally detectable in upper and lower respiratory specimens during  the acute phase of infection. Negative results do not preclude SARS-CoV-2 infection, do not rule out co-infections with other pathogens, and should not be used as the sole basis for treatment or other patient management decisions. Negative results must be combined with clinical observations, patient history, and epidemiological information. The expected result is Negative.  Fact Sheet for Patients: HairSlick.no  Fact Sheet for Healthcare Providers: quierodirigir.com  This test is not yet approved or cleared by the [place] FDA and  has been authorized for detection and/or diagnosis of SARS-CoV-2 by FDA under an Emergency Use Authorization (EUA). This EUA will remain  in effect (meaning this test can be used) for the duration of the COVID-19 declaration under Se ction 564(b)(1) of the Act, 21 U.S.C. section 360bbb-3(b)(1), unless the authorization is terminated or revoked sooner.  Performed at [facility], [address], [place], [place] [address]     Time coordinating discharge: Over 30 minutes  SIGNED:  Arnetha Courser, MD  [facility] 07/06/2020, 10:03 AM  If 7PM-7AM, please contact night-coverage [email or web address]  This record has been created using Conservation officer, historic buildings. Errors have been sought and corrected,but may not always be located. Such creation errors do not reflect on the standard of care.

## 2020-07-06 NOTE — TOC Progression Note (Signed)
Transition of Care [facility]) - Progression Note    Patient Details  Name: Kristen Osborn MRN: [id number] Date of Birth: [birth year]  Transition of [facility]) CM/SW Contact  Trenton Founds, RN Phone Number: 07/06/2020, 8:33 AM  Clinical Narrative:  RNCM received insurance authorization through Navi portal. UHC auth ID# [id number] and Navi reference number of [id number]. Authorization good beginning 2/26 with next review date of 3/1.      Expected Discharge Plan: Skilled Nursing Facility Barriers to Discharge: No Barriers Identified  Expected Discharge Plan and Services Expected Discharge Plan: Skilled Nursing Facility       Living arrangements for the past 2 months: Skilled Nursing Facility                                       Social Determinants of Health (SDOH) Interventions    Readmission Risk Interventions No flowsheet data found.

## 2020-07-06 NOTE — Care Management Important Message (Signed)
Important Message  Patient Details  Name: GARDENIA WITTER MRN: [id number] Date of Birth: April 07, 1927   Medicare Important Message Given:  Yes     Olegario Messier A Farooq Petrovich 07/06/2020, 11:06 AM

## 2020-07-06 NOTE — Progress Notes (Signed)
Physical Therapy Treatment Patient Details Name: Kristen Osborn MRN: [id number] DOB: 1926-11-25 Today's Date: 07/06/2020    History of Present Illness Pt is a 87 y.o. female presenting to hospital 2/16 s/p fall at home c/o R hip and R hand pain.  Pt admitted with closed displaced fx of R femoral neck, distal radius fx R, a-fib and a-flutter with RVR in ER; pt also noted with urinary retention.  Pt s/p 2/16 R hip anterior hip hemiarthroplasty and closed reduction and splinting of R distal radius.  Transferred 2/22 to CCU d/t post op hyponatremia.  PMH includes dementia, a-fib on Eliquis, CHF s/p pacemaker, PAD.    PT Comments    Pt ready for session.  Min a x 1 to EOB.  Stood with min a x 1 and is able to walk 40' x 2 with platform RW and min a x 1.  Daughter follows with recliner.  Gait quality is improved today but she continues to need hands on assist at all times for balance, navigating walker and correct hand placements.  She remains a high fall risk and SNF is appropriate for discharge.   Follow Up Recommendations  SNF     Equipment Recommendations  Wheelchair (measurements PT);Wheelchair cushion (measurements PT);Other (comment)    Recommendations for Other Services       Precautions / Restrictions Precautions Precautions: Fall;Anterior Hip Precaution Booklet Issued: Yes (comment) Restrictions Weight Bearing Restrictions: Yes RUE Weight Bearing: Weight bear through elbow only RLE Weight Bearing: Weight bearing as tolerated    Mobility  Bed Mobility Overal bed mobility: Needs Assistance Bed Mobility: Supine to Sit     Supine to sit: Min assist     General bed mobility comments: good effort today and less assist.  cues not to use RUE    Transfers Overall transfer level: Needs assistance Equipment used: Right platform walker Transfers: Sit to/from Stand Sit to Stand: Mod assist;Min assist            Ambulation/Gait Ambulation/Gait assistance: Min  assist;Mod assist Gait Distance (Feet): 40 Feet Assistive device: Right platform walker Gait Pattern/deviations: Step-to pattern;Antalgic;Trunk flexed Gait velocity: decreased   General Gait Details: 40' x 2 with seated rest.  overall less assist today and improved gait quality but conitnues to need hands on assist at all times to prevent falls.   Stairs             Wheelchair Mobility    Modified Rankin (Stroke Patients Only)       Balance Overall balance assessment: Needs assistance Sitting-balance support: No upper extremity supported;Feet supported Sitting balance-Leahy Scale: Good     Standing balance support: Bilateral upper extremity supported;During functional activity Standing balance-Leahy Scale: Poor Standing balance comment: pt is high fall risk                            Cognition Arousal/Alertness: Awake/alert Behavior During Therapy: WFL for tasks assessed/performed Overall Cognitive Status: History of cognitive impairments - at baseline                                        Exercises      General Comments        Pertinent Vitals/Pain Pain Assessment: Faces Faces Pain Scale: Hurts even more Pain Location: R hip/thigh Pain Descriptors / Indicators: Grimacing;Operative site guarding Pain Intervention(s): Limited activity  within patient's tolerance;Monitored during session;Repositioned    Home Living                      Prior Function            PT Goals (current goals can now be found in the care plan section) Progress towards PT goals: Progressing toward goals    Frequency    7X/week      PT Plan Current plan remains appropriate    Co-evaluation              AM-PAC PT "6 Clicks" Mobility   Outcome Measure  Help needed turning from your back to your side while in a flat bed without using bedrails?: A Little Help needed moving from lying on your back to sitting on the side of a  flat bed without using bedrails?: A Lot Help needed moving to and from a bed to a chair (including a wheelchair)?: A Lot Help needed standing up from a chair using your arms (e.g., wheelchair or bedside chair)?: A Lot Help needed to walk in hospital room?: A Lot Help needed climbing 3-5 steps with a railing? : Total 6 Click Score: 12    End of Session Equipment Utilized During Treatment: Gait belt Activity Tolerance: Patient tolerated treatment well;Patient limited by fatigue Patient left: in chair;with call bell/phone within reach;with chair alarm set;with family/visitor present Nurse Communication: Mobility status;Precautions;Weight bearing status Pain - Right/Left: Right Pain - part of body: Hip     Time: 1610-9604 PT Time Calculation (min) (ACUTE ONLY): 23 min  Charges:  $Gait Training: 8-22 mins $Therapeutic Activity: 8-22 mins                    Danielle Dess, PTA 07/06/20, 12:34 PM

## 2020-07-14 ENCOUNTER — Telehealth: Payer: Self-pay

## 2020-07-14 NOTE — Telephone Encounter (Signed)
Marylene Land called and states bladder scan 39 . [phone]

## 2020-07-14 NOTE — Telephone Encounter (Signed)
Talked with Jarrett Soho at [employer] [employer] . Advised that we dont to see Mrs. [employer] . Patient to follow up PRN.

## 2020-07-14 NOTE — Telephone Encounter (Signed)
Per Dr. Apolinar Junes contacted patient's home [facility] and spoke with nurse Marylene Land to give verbal order to remove foley in the morning before visit tomorrow with our office. Marylene Land states patient is mobile and walking on her own and currently in PT. Patient did accidentally pull out catheter this morning, they have tried twice to replace with out success. She was told not to replace at this time and to let patient try and urinate on her own. Order was given to bladder scan at 12:30 and call with results after for further instruction. Marylene Land gave verbal read back of order.

## 2020-07-15 ENCOUNTER — Ambulatory Visit: Payer: Medicare Other | Admitting: Urology

## 2020-07-31 ENCOUNTER — Other Ambulatory Visit: Payer: Self-pay

## 2020-07-31 ENCOUNTER — Inpatient Hospital Stay
Admission: EM | Admit: 2020-07-31 | Discharge: 2020-08-06 | DRG: 480 | Disposition: A | Discharge status: Home Health | Payer: Medicare Other | Attending: Student | Admitting: Student

## 2020-07-31 ENCOUNTER — Emergency Department: Payer: Medicare Other

## 2020-07-31 DIAGNOSIS — E871 Hypo-osmolality and hyponatremia: Secondary | ICD-10-CM | POA: Diagnosis present

## 2020-07-31 DIAGNOSIS — W010XXA Fall on same level from slipping, tripping and stumbling without subsequent striking against object, initial encounter: Secondary | ICD-10-CM | POA: Diagnosis present

## 2020-07-31 DIAGNOSIS — D62 Acute posthemorrhagic anemia: Secondary | ICD-10-CM | POA: Diagnosis not present

## 2020-07-31 DIAGNOSIS — R739 Hyperglycemia, unspecified: Secondary | ICD-10-CM | POA: Diagnosis present

## 2020-07-31 DIAGNOSIS — I5032 Chronic diastolic (congestive) heart failure: Secondary | ICD-10-CM | POA: Diagnosis present

## 2020-07-31 DIAGNOSIS — Z419 Encounter for procedure for purposes other than remedying health state, unspecified: Secondary | ICD-10-CM

## 2020-07-31 DIAGNOSIS — G309 Alzheimer's disease, unspecified: Secondary | ICD-10-CM | POA: Diagnosis present

## 2020-07-31 DIAGNOSIS — I739 Peripheral vascular disease, unspecified: Secondary | ICD-10-CM | POA: Diagnosis present

## 2020-07-31 DIAGNOSIS — Z79899 Other long term (current) drug therapy: Secondary | ICD-10-CM

## 2020-07-31 DIAGNOSIS — I10 Essential (primary) hypertension: Secondary | ICD-10-CM

## 2020-07-31 DIAGNOSIS — I4891 Unspecified atrial fibrillation: Secondary | ICD-10-CM

## 2020-07-31 DIAGNOSIS — S7291XA Unspecified fracture of right femur, initial encounter for closed fracture: Secondary | ICD-10-CM

## 2020-07-31 DIAGNOSIS — I482 Chronic atrial fibrillation, unspecified: Secondary | ICD-10-CM | POA: Diagnosis present

## 2020-07-31 DIAGNOSIS — S72001A Fracture of unspecified part of neck of right femur, initial encounter for closed fracture: Secondary | ICD-10-CM | POA: Diagnosis not present

## 2020-07-31 DIAGNOSIS — G4700 Insomnia, unspecified: Secondary | ICD-10-CM | POA: Diagnosis present

## 2020-07-31 DIAGNOSIS — G301 Alzheimer's disease with late onset: Secondary | ICD-10-CM | POA: Diagnosis not present

## 2020-07-31 DIAGNOSIS — R5381 Other malaise: Secondary | ICD-10-CM | POA: Diagnosis present

## 2020-07-31 DIAGNOSIS — F0280 Dementia in other diseases classified elsewhere without behavioral disturbance: Secondary | ICD-10-CM

## 2020-07-31 DIAGNOSIS — Z681 Body mass index (BMI) 19 or less, adult: Secondary | ICD-10-CM

## 2020-07-31 DIAGNOSIS — M9701XA Periprosthetic fracture around internal prosthetic right hip joint, initial encounter: Principal | ICD-10-CM | POA: Diagnosis present

## 2020-07-31 DIAGNOSIS — N390 Urinary tract infection, site not specified: Secondary | ICD-10-CM | POA: Diagnosis present

## 2020-07-31 DIAGNOSIS — Z66 Do not resuscitate: Secondary | ICD-10-CM | POA: Diagnosis present

## 2020-07-31 DIAGNOSIS — F05 Delirium due to known physiological condition: Secondary | ICD-10-CM | POA: Diagnosis not present

## 2020-07-31 DIAGNOSIS — E559 Vitamin D deficiency, unspecified: Secondary | ICD-10-CM | POA: Diagnosis present

## 2020-07-31 DIAGNOSIS — E43 Unspecified severe protein-calorie malnutrition: Secondary | ICD-10-CM

## 2020-07-31 DIAGNOSIS — Z7901 Long term (current) use of anticoagulants: Secondary | ICD-10-CM

## 2020-07-31 DIAGNOSIS — I4821 Permanent atrial fibrillation: Secondary | ICD-10-CM | POA: Diagnosis present

## 2020-07-31 DIAGNOSIS — Z20822 Contact with and (suspected) exposure to covid-19: Secondary | ICD-10-CM | POA: Diagnosis present

## 2020-07-31 DIAGNOSIS — S72002A Fracture of unspecified part of neck of left femur, initial encounter for closed fracture: Secondary | ICD-10-CM | POA: Insufficient documentation

## 2020-07-31 DIAGNOSIS — N179 Acute kidney failure, unspecified: Secondary | ICD-10-CM | POA: Diagnosis present

## 2020-07-31 DIAGNOSIS — D72828 Other elevated white blood cell count: Secondary | ICD-10-CM | POA: Diagnosis present

## 2020-07-31 DIAGNOSIS — I4892 Unspecified atrial flutter: Secondary | ICD-10-CM

## 2020-07-31 DIAGNOSIS — E860 Dehydration: Secondary | ICD-10-CM | POA: Diagnosis present

## 2020-07-31 DIAGNOSIS — Z950 Presence of cardiac pacemaker: Secondary | ICD-10-CM

## 2020-07-31 DIAGNOSIS — W19XXXA Unspecified fall, initial encounter: Secondary | ICD-10-CM

## 2020-07-31 DIAGNOSIS — Z90710 Acquired absence of both cervix and uterus: Secondary | ICD-10-CM

## 2020-07-31 DIAGNOSIS — K219 Gastro-esophageal reflux disease without esophagitis: Secondary | ICD-10-CM | POA: Diagnosis present

## 2020-07-31 DIAGNOSIS — R64 Cachexia: Secondary | ICD-10-CM | POA: Diagnosis present

## 2020-07-31 DIAGNOSIS — I110 Hypertensive heart disease with heart failure: Secondary | ICD-10-CM | POA: Diagnosis present

## 2020-07-31 LAB — CBC WITH DIFFERENTIAL/PLATELET
Abs Immature Granulocytes: 0.03 10*3/uL (ref 0.00–0.07)
Basophils Absolute: 0.1 10*3/uL (ref 0.0–0.1)
Basophils Relative: 1 %
Eosinophils Absolute: 0.2 10*3/uL (ref 0.0–0.5)
Eosinophils Relative: 2 %
HCT: 33 % — ABNORMAL LOW (ref 36.0–46.0)
Hemoglobin: 10.6 g/dL — ABNORMAL LOW (ref 12.0–15.0)
Immature Granulocytes: 0 %
Lymphocytes Relative: 22 %
Lymphs Abs: 2.1 10*3/uL (ref 0.7–4.0)
MCH: 29.2 pg (ref 26.0–34.0)
MCHC: 32.1 g/dL (ref 30.0–36.0)
MCV: 90.9 fL (ref 80.0–100.0)
Monocytes Absolute: 1.1 10*3/uL — ABNORMAL HIGH (ref 0.1–1.0)
Monocytes Relative: 12 %
Neutro Abs: 6.1 10*3/uL (ref 1.7–7.7)
Neutrophils Relative %: 63 %
Platelets: 239 10*3/uL (ref 150–400)
RBC: 3.63 MIL/uL — ABNORMAL LOW (ref 3.87–5.11)
RDW: 14.8 % (ref 11.5–15.5)
WBC: 9.6 10*3/uL (ref 4.0–10.5)
nRBC: 0 % (ref 0.0–0.2)

## 2020-07-31 LAB — RESP PANEL BY RT-PCR (FLU A&B, COVID) ARPGX2
Influenza A by PCR: NEGATIVE
Influenza B by PCR: NEGATIVE
SARS Coronavirus 2 by RT PCR: NEGATIVE

## 2020-07-31 LAB — BASIC METABOLIC PANEL
Anion gap: 10 (ref 5–15)
BUN: 27 mg/dL — ABNORMAL HIGH (ref 8–23)
CO2: 23 mmol/L (ref 22–32)
Calcium: 8.7 mg/dL — ABNORMAL LOW (ref 8.9–10.3)
Chloride: 96 mmol/L — ABNORMAL LOW (ref 98–111)
Creatinine, Ser: 0.99 mg/dL (ref 0.44–1.00)
GFR, Estimated: 54 mL/min — ABNORMAL LOW (ref >60)
Glucose, Bld: 187 mg/dL — ABNORMAL HIGH (ref 70–99)
Potassium: 4.6 mmol/L (ref 3.5–5.1)
Sodium: 129 mmol/L — ABNORMAL LOW (ref 135–145)

## 2020-07-31 LAB — CK: Total CK: 106 U/L (ref 38–234)

## 2020-07-31 MED ORDER — VITAMIN B-12 1000 MCG PO TABS
1000.0000 ug | ORAL_TABLET | Freq: Every day | ORAL | Status: DC
  Administered 2020-07-31: 1000 ug via SUBLINGUAL
  Administered 2020-08-01 – 2020-08-06 (×5): 1000 ug via ORAL
  Filled 2020-07-31 (×7): qty 1

## 2020-07-31 MED ORDER — METOPROLOL TARTRATE 5 MG/5ML IV SOLN: 5.0000 mg | INTRAVENOUS | Status: DC | PRN

## 2020-07-31 MED ORDER — FERROUS SULFATE 325 (65 FE) MG PO TABS
325.0000 mg | ORAL_TABLET | Freq: Every day | ORAL | Status: DC
  Administered 2020-08-01 – 2020-08-06 (×5): 325 mg via ORAL
  Filled 2020-07-31 (×6): qty 1

## 2020-07-31 MED ORDER — HEPARIN SODIUM (PORCINE) 5000 UNIT/ML IJ SOLN
5000.0000 [IU] | Freq: Three times a day (TID) | INTRAMUSCULAR | Status: DC
  Administered 2020-07-31 – 2020-08-02 (×5): 5000 [IU] via SUBCUTANEOUS
  Filled 2020-07-31 (×4): qty 1

## 2020-07-31 MED ORDER — LABETALOL HCL 5 MG/ML IV SOLN
5.0000 mg | INTRAVENOUS | Status: AC | PRN
  Administered 2020-08-01 – 2020-08-02 (×2): 5 mg via INTRAVENOUS
  Filled 2020-07-31 (×3): qty 4

## 2020-07-31 MED ORDER — ADULT MULTIVITAMIN W/MINERALS CH
1.0000 | ORAL_TABLET | Freq: Every day | ORAL | Status: DC
  Administered 2020-07-31 – 2020-08-06 (×6): 1 via ORAL
  Filled 2020-07-31 (×7): qty 1

## 2020-07-31 MED ORDER — SODIUM CHLORIDE 0.9 % IV SOLN
1.0000 g | INTRAVENOUS | Status: DC
  Administered 2020-08-01 – 2020-08-04 (×4): 1 g via INTRAVENOUS
  Filled 2020-07-31 (×4): qty 1
  Filled 2020-07-31 (×2): qty 10

## 2020-07-31 MED ORDER — QUETIAPINE FUMARATE 25 MG PO TABS: 25.0000 mg | ORAL_TABLET | Freq: Every day | ORAL | Status: DC

## 2020-07-31 MED ORDER — ACETAMINOPHEN 325 MG PO TABS: 325.0000 mg | ORAL_TABLET | Freq: Four times a day (QID) | ORAL | Status: AC | PRN

## 2020-07-31 MED ORDER — ONDANSETRON HCL 4 MG/2ML IJ SOLN
4.0000 mg | Freq: Four times a day (QID) | INTRAMUSCULAR | Status: DC | PRN
Start: 2020-07-31 — End: 2020-08-06

## 2020-07-31 MED ORDER — METOPROLOL TARTRATE 25 MG PO TABS
25.0000 mg | ORAL_TABLET | Freq: Four times a day (QID) | ORAL | Status: DC
  Administered 2020-07-31 – 2020-08-06 (×18): 25 mg via ORAL
  Filled 2020-07-31 (×21): qty 1

## 2020-07-31 MED ORDER — MORPHINE SULFATE (PF) 2 MG/ML IV SOLN
0.5000 mg | INTRAVENOUS | Status: AC | PRN
  Administered 2020-07-31 – 2020-08-01 (×3): 0.5 mg via INTRAVENOUS
  Filled 2020-07-31 (×3): qty 1

## 2020-07-31 MED ORDER — HYDROCODONE-ACETAMINOPHEN 5-325 MG PO TABS
1.0000 | ORAL_TABLET | Freq: Four times a day (QID) | ORAL | Status: DC | PRN
  Administered 2020-08-01 – 2020-08-06 (×8): 1 via ORAL
  Filled 2020-07-31 (×9): qty 1

## 2020-07-31 MED ORDER — MORPHINE SULFATE (PF) 4 MG/ML IV SOLN
4.0000 mg | INTRAVENOUS | Status: AC
  Administered 2020-07-31: 4 mg via INTRAVENOUS

## 2020-07-31 MED ORDER — ENSURE ENLIVE PO LIQD
237.0000 mL | Freq: Three times a day (TID) | ORAL | Status: DC
  Administered 2020-07-31 – 2020-08-06 (×11): 237 mL via ORAL

## 2020-07-31 MED ORDER — MORPHINE SULFATE (PF) 4 MG/ML IV SOLN
INTRAVENOUS | Status: AC
  Filled 2020-07-31: qty 1

## 2020-07-31 MED ORDER — FUROSEMIDE 20 MG PO TABS: 10.0000 mg | ORAL_TABLET | Freq: Every day | ORAL | Status: DC

## 2020-07-31 MED ORDER — LABETALOL HCL 5 MG/ML IV SOLN: 5.0000 mg | INTRAVENOUS | Status: DC | PRN

## 2020-07-31 MED ORDER — SODIUM CHLORIDE 0.9 % IV SOLN: 1.0000 g | INTRAVENOUS | Status: DC

## 2020-07-31 MED ORDER — LOSARTAN POTASSIUM 25 MG PO TABS
25.0000 mg | ORAL_TABLET | Freq: Every day | ORAL | Status: DC
Start: 2020-08-01 — End: 2020-08-06
  Administered 2020-08-01 – 2020-08-06 (×6): 25 mg via ORAL
  Filled 2020-07-31 (×6): qty 1

## 2020-07-31 MED ORDER — PANTOPRAZOLE SODIUM 40 MG PO TBEC
40.0000 mg | DELAYED_RELEASE_TABLET | Freq: Every day | ORAL | Status: DC
  Administered 2020-08-01 – 2020-08-06 (×5): 40 mg via ORAL
  Filled 2020-07-31 (×7): qty 1

## 2020-07-31 MED ORDER — DOCUSATE SODIUM 100 MG PO CAPS
100.0000 mg | ORAL_CAPSULE | Freq: Two times a day (BID) | ORAL | Status: DC
  Administered 2020-08-01 (×3): 100 mg via ORAL
  Filled 2020-07-31 (×5): qty 1

## 2020-07-31 MED ORDER — SODIUM CHLORIDE 1 G PO TABS
1.0000 g | ORAL_TABLET | Freq: Every day | ORAL | Status: DC
  Administered 2020-08-01 – 2020-08-02 (×3): 1 g via ORAL
  Filled 2020-07-31 (×3): qty 1

## 2020-07-31 MED ORDER — SODIUM CHLORIDE 0.9 % IV SOLN
1.0000 g | INTRAVENOUS | Status: DC
  Administered 2020-07-31: 1 g via INTRAVENOUS

## 2020-07-31 MED ORDER — QUETIAPINE FUMARATE 25 MG PO TABS
25.0000 mg | ORAL_TABLET | Freq: Every day | ORAL | Status: DC
  Administered 2020-08-01 – 2020-08-05 (×4): 25 mg via ORAL
  Filled 2020-07-31 (×4): qty 1

## 2020-07-31 NOTE — Progress Notes (Signed)
Initial Nutrition Assessment  DOCUMENTATION CODES:   Not applicable  INTERVENTION:   -Continue Ensure Enlive po TID, each supplement provides 350 kcal and 20 grams of protein -Continue MVI with minerals daily  NUTRITION DIAGNOSIS:   Increased nutrient needs related to post-op healing as evidenced by estimated needs.  GOAL:   Patient will meet greater than or equal to 90% of their needs  MONITOR:   PO intake,Supplement acceptance,Diet advancement,Labs,Weight trends,Skin,I & O's  REASON FOR ASSESSMENT:   Consult Assessment of nutrition requirement/status,Hip fracture protocol  ASSESSMENT:   Kristen Osborn is a 87 y.o. female who presents to the emergency department today with primary complaint of right hip pain.  Pt admitted with rt femur fracture.   Pt awaiting orthopedics consult.   Reviewed wt hx; pt has experienced a 39% wt loss over the past 8 months, which is significant for time frame.   Highly suspect malnutrition, however, unable to identify at this time. Noted Ensure Enlive and MVI have already been ordered.  Medications reviewed and include lasix.   Labs reviewed: Na: 129.   Diet Order:   Diet Order            Diet regular Room service appropriate? Yes; Fluid consistency: Thin  Diet effective now                 EDUCATION NEEDS:   No education needs have been identified at this time  Skin:  Skin Assessment: Reviewed RN Assessment  Last BM:  Unknown  Height:   Ht Readings from Last 1 Encounters:  06/24/20 5\' 6"  (1.676 m)    Weight:   Wt Readings from Last 1 Encounters:  06/25/20 44.6 kg    Ideal Body Weight:  59.1 kg  BMI:  There is no height or weight on file to calculate BMI.  Estimated Nutritional Needs:   Kcal:  1400-1600  Protein:  60-75 grams  Fluid:  > 1.4 L    06/27/20, RD, LDN, CDCES Registered Dietitian II Certified Diabetes Care and Education Specialist Please refer to [facility] for RD and/or RD  on-call/weekend/after hours pager

## 2020-07-31 NOTE — Progress Notes (Signed)
Patient received from ED-  After transferring and placing on the monitor- patient went into A.fib RVR with HR in 140s-150s.  Night nurse coming on- will continue to monitor   Patient having a lot of pain after transfer- pain not due at time.

## 2020-07-31 NOTE — Progress Notes (Signed)
PT Cancellation Note  Patient Details Name: Kristen Osborn MRN: [id number] DOB: 07-13-1926   Cancelled Treatment:    Reason Eval/Treat Not Completed: Medical issues which prohibited therapy (Consult received and chart reviewed. Patient noted with acute peri-prosthetic fracture to R hip, pending surgical repair. Will remain on bedrest until procedure complete and patient cleared for activity.  Will complete PT/OT orders at this time; please re-consult post op as medically appropriate.)   Elaysha Bevard H. Manson Passey, PT, DPT, NCS 07/31/20, 8:22 PM [phone]

## 2020-07-31 NOTE — ED Provider Notes (Signed)
[facility] Emergency Department Provider Note   ____________________________________________   I have reviewed the triage vital signs and the nursing notes.   HISTORY  Chief Complaint Hip Pain (right)   History limited by: Not Limited   HPI Kristen Osborn is a 87 y.o. female who presents to the emergency department today with primary complaint of right hip pain.  The patient caught her right foot on a curb and fell.  The patient did not hit her head.  She is complaining of severe pain to the right thigh.  Patient recently underwent right hip surgery after a fracture.  Patient denies any recent illness or fever.  Did go see primary care earlier in the day today and was diagnosed with a UTI.  Records reviewed. Per medical record review patient has a history of recent right hip fracture  Past Medical History:  Diagnosis Date  . Atrial fibrillation (HCC)   . CHF (congestive heart failure) (HCC)   . Dementia [facility])     Patient Active Problem List   Diagnosis Date Noted  . Protein-calorie malnutrition, severe 06/26/2020  . Chronic anticoagulation 06/25/2020  . Closed displaced fracture of right femoral neck (HCC) 06/25/2020  . Preoperative clearance 06/25/2020  . Distal radius fracture, right 06/25/2020  . Closed right hip fracture (HCC) 06/25/2020  . PAD (peripheral artery disease) (HCC) 10/17/2018  . Status post placement of cardiac pacemaker 10/17/2018  . Alzheimer's dementia without behavioral disturbance (HCC) 04/17/2018  . Atrial fibrillation and flutter (HCC) 04/17/2018  . Benign essential HTN 03/20/2014    Past Surgical History:  Procedure Laterality Date  . ABDOMINAL HYSTERECTOMY    . CLOSED REDUCTION WRIST FRACTURE Right 06/28/2020   Procedure: CLOSED REDUCTION WRIST;  Surgeon: Lyndle Herrlich, MD;  Location: [facility] ORS;  Service: Orthopedics;  Laterality: Right;  . HIP ARTHROPLASTY Right 06/28/2020   Procedure: ARTHROPLASTY BIPOLAR HIP  (HEMIARTHROPLASTY);  Surgeon: Lyndle Herrlich, MD;  Location: [facility] ORS;  Service: Orthopedics;  Laterality: Right;    Prior to Admission medications   Medication Sig Start Date End Date Taking? Authorizing Provider  apixaban (ELIQUIS) 2.5 MG TABS tablet Take 1 tablet (2.5 mg total) by mouth 2 (two) times daily. 07/06/20   Arnetha Courser, MD  Cyanocobalamin 1000 MCG LOZG Place 1 lozenge under the tongue daily.    [provider]  docusate sodium (COLACE) 100 MG capsule Take 1 capsule (100 mg total) by mouth 2 (two) times daily. 07/06/20   Arnetha Courser, MD  epoetin alfa (EPOGEN) 4000 UNIT/ML injection Inject 0.75 mLs (3,000 Units total) into the skin every 14 (fourteen) days. 07/18/20   Arnetha Courser, MD  feeding supplement (ENSURE ENLIVE / ENSURE PLUS) LIQD Take 237 mLs by mouth 3 (three) times daily between meals. 07/06/20   Arnetha Courser, MD  ferrous sulfate 325 (65 FE) MG tablet Take 1 tablet (325 mg total) by mouth daily with breakfast. 07/06/20   Arnetha Courser, MD  furosemide (LASIX) 20 MG tablet Take 0.5 tablets (10 mg total) by mouth daily. 07/06/20   Arnetha Courser, MD  HYDROcodone-acetaminophen (NORCO/VICODIN) 5-325 MG tablet Take 1-2 tablets by mouth every 6 (six) hours as needed for moderate pain. 07/06/20   Arnetha Courser, MD  lidocaine (LIDODERM) 5 % Place 1 patch onto the skin daily. Remove & Discard patch within 12 hours or as directed by MD 07/06/20   Arnetha Courser, MD  liver oil-zinc oxide (DESITIN) 40 % ointment Apply topically as needed for irritation. 07/06/20  Arnetha Courser, MD  losartan (COZAAR) 25 MG tablet Take 25 mg by mouth daily. 05/05/20   [provider]  metoprolol tartrate (LOPRESSOR) 25 MG tablet Take 1 tablet (25 mg total) by mouth every 6 (six) hours. 07/06/20   Arnetha Courser, MD  Multiple Vitamin (MULTIVITAMIN WITH MINERALS) TABS tablet Take 1 tablet by mouth daily. 07/06/20   Arnetha Courser, MD  omeprazole (PRILOSEC) 20 MG capsule Take 20 mg by mouth daily.  04/17/20   [provider]  polyethylene glycol (MIRALAX / GLYCOLAX) 17 g packet Take 17 g by mouth daily as needed for mild constipation or moderate constipation. 07/06/20   Arnetha Courser, MD  potassium chloride (KLOR-CON) 8 MEQ tablet Take 8 mEq by mouth daily. 06/12/20   [provider]  QUEtiapine (SEROQUEL) 25 MG tablet Take 25 mg by mouth daily. Takes at 5 pm 06/16/20   [provider]  sodium chloride 1 g tablet Take 1 tablet (1 g total) by mouth daily. 07/06/20   Arnetha Courser, MD    Allergies Lisinopril  No family history on file.  Social History Social History   Tobacco Use  . Smoking status: Never Smoker  . Smokeless tobacco: Never Used  Vaping Use  . Vaping Use: Never used  Substance Use Topics  . Alcohol use: Not Currently  . Drug use: Never    Review of Systems Constitutional: No fever/chills Eyes: No visual changes. ENT: No sore throat. Cardiovascular: Denies chest pain. Respiratory: Denies shortness of breath. Gastrointestinal: No abdominal pain.  No nausea, no vomiting.  No diarrhea.   Genitourinary: Negative for dysuria. Musculoskeletal: Positive for right leg pain. Skin: Negative for rash. Neurological: Negative for headaches, focal weakness or numbness.  ____________________________________________   PHYSICAL EXAM:  VITAL SIGNS: ED Triage Vitals [07/31/20 1451]  Enc Vitals Group     BP (!) 171/105     Pulse Rate (!) 134     Resp (!) 24     Temp (!) 97.5 F (36.4 C)     Temp Source Oral     SpO2 97 %     Weight      Height      Head Circumference      Peak Flow      Pain Score 10   Constitutional: Alert and oriented.  Eyes: Conjunctivae are normal.  ENT      Head: Normocephalic and atraumatic.      Nose: No congestion/rhinnorhea.      Mouth/Throat: Mucous membranes are moist.      Neck: No stridor. Hematological/Lymphatic/Immunilogical: No cervical lymphadenopathy. Cardiovascular: Normal rate, regular rhythm.   No murmurs, rubs, or gallops.  Respiratory: Normal respiratory effort without tachypnea nor retractions. Breath sounds are clear and equal bilaterally. No wheezes/rales/rhonchi. Gastrointestinal: Soft and non tender. No rebound. No guarding.  Genitourinary: Deferred Musculoskeletal: Right thigh with swelling. Soft. DP 2+.  Neurologic:  Normal speech and language. No gross focal neurologic deficits are appreciated.  Skin:  Skin is warm, dry and intact. No rash noted. Psychiatric: Mood and affect are normal. Speech and behavior are normal. Patient exhibits appropriate insight and judgment.  ____________________________________________    LABS (pertinent positives/negatives)  CBC wbc 9.6, hgb 10.6, plt 239 BMP na 129, k 4.6, glu 187, cr 0.99  ____________________________________________   EKG  I, Phineas Semen, attending physician, personally viewed and interpreted this EKG  EKG Time: 1455 Rate: 115 Rhythm: atrial fibrillation Axis: left axis deviation Intervals: qtc 480 QRS: LAFB ST changes: no st  elevation Impression: abnormal ekg ____________________________________________    RADIOLOGY  Right femur Periprosthetic fracture  CXR No acute abnormality  Pelvis x-ray Right hip fracture  ____________________________________________   PROCEDURES  Procedures  ____________________________________________   INITIAL IMPRESSION / ASSESSMENT AND PLAN / ED COURSE  Pertinent labs & imaging results that were available during my care of the patient were reviewed by me and considered in my medical decision making (see chart for details).   Patient presented to the emergency department today because of concern for right hip pain after a fall. Recently had hip replacement. The patient was found to have a periprosthetic fracture today. Discussed with Dr. Martha Clan with orthopedics. Will plan on admission to the hospitalist service. At this time patient does have swelling,  although compartments are soft and pulses are present.    ____________________________________________   FINAL CLINICAL IMPRESSION(S) / ED DIAGNOSES  Final diagnoses:  Closed fracture of right femur, unspecified fracture morphology, unspecified portion of femur, initial encounter [facility])     Note: This dictation was prepared with Dragon dictation. Any transcriptional errors that result from this process are unintentional     Phineas Semen, MD 07/31/20 1624

## 2020-07-31 NOTE — H&P (Addendum)
History and Physical   Kristen Osborn PPJ:[id number] DOB: 1926/09/22 DOA: 07/31/2020  PCP: Rusty Aus, MD  Outpatient Specialists: [facility] cardiology, Dr. Nehemiah Massed Patient coming from: Home  I have personally briefly reviewed patient's old medical records in [place].  Chief Concern: Fall and right hip pain  HPI: Kristen Osborn is a 87 y.o. female with medical history significant for primary hypertension, peripheral artery disease, atrial fibrillation on Eliquis, diastolic heart failure, Alzheimer dementia, status post permanent pacemaker, presents to the emergency department for chief concerns of a witnessed fall after tripping.  Patient was out having lunch with her daughter.  At baseline she ambulates with a walker.  Her daughter had helped her get into the car and went to the other side to get into the car herself.  Patient then opens the door and attempts to ambulate on her own stating to her daughter that she needed to use the restroom.  She then tripped and fell at the threshold of the sidewalk elevation.  Negative for head trauma with weakness or loss of consciousness.  Since the fall, patient endorses 10 out of 10 pain of the right hip.  Social history: Denies tobacco, EtOH, recreational drug use.  Vaccination: Patient is vaccinated for COVID-19 with 2 doses.  ROS: unable to complete as patient has moderate to severe dementia at baseline  ED Course: Discussed with ED provider, patient requiring hospitalization due to periprosthetic fracture of the right femur.  Vitals in the emergency department showed a temperature of 97.5, respiration rate of 26, heart rate of 154, blood pressure 171/105, satting at 97% on room air.  Labs in the emergency department was remarkable for serum sodium of 129, potassium 4.6, chloride 96, bicarb 23, BUN of 27, serum creatinine of 0.99, nonfasting blood glucose of 187.  WBC was 9.6, hemoglobin was 10.6, platelets 239, EGFR of 54.  CK was  106.  Assessment/Plan  Principal Problem:   Closed right hip fracture (HCC) Active Problems:   Alzheimer's dementia without behavioral disturbance (HCC)   Atrial fibrillation and flutter (HCC)   Benign essential HTN   PAD (peripheral artery disease) (HCC)   Status post placement of cardiac pacemaker   Chronic anticoagulation   Closed displaced fracture of right femoral neck (HCC)   Protein-calorie malnutrition, severe   Closed right hip fracture secondary to mechanical fall -Dr. Mack Guise, orthopedic surgeon has been consulted -Patient will need to be taken to the OR however as she is on Eliquis this needs to be held -Pain control: morphine 0.5 mg IV for severe pain, Norco 1 to 2 tablets every 6 hours as needed for moderate pain, acetaminophen 325 mg every 6 hours as needed for mild pain, headache, fever -Admit to progressive cardiac, telemetry, observation  Hypertension-elevated suspect secondary to increased pain -Resumed home losartan 25 mg daily, metoprolol tartrate 25 mg every 6 hours home dose, Lasix 10 mg daily  Atrial fibrillation with RVR-presumed secondary to pain -Treat as above -Holding home Eliquis -Labetalol injection 5 mg IV every 2 hours as needed for heart rate greater than 160, hold if SBP greater than 90  Hyponatremia-at baseline, resumed home salt tablets  Insomnia-resumed home quetiapine 25 mg p.o. nightly  GERD-PPI resumed  Severe protein/calorie malnutrition-dietary has been consulted -Resumed home dietary supplementation  A.m. labs: CK, CBC, BMP  Chart reviewed.   Hospitalization from 06/24/2020 to 07/06/2020: For closed displaced fracture of the right femoral neck she was taken to the OR by Dr. Harlow Mares on  06/28/2020 for hemiarthroplasty and closed reduction of wrist fracture.  She was discharged to skilled nursing facility.  DVT prophylaxis: Heparin 5000 subcutaneous 3 times daily, a.m. team to discontinue 2 hours before surgery Code Status: DNR   Diet: Heart healthy Family Communication: Updated daughter, Jan at bedside  Disposition Plan: Pending clinical course Consults called: Orthopedic Admission status: Progressive cardiac, with telemetry, observation  Past Medical History:  Diagnosis Date  . Atrial fibrillation ([place])   . CHF (congestive heart failure) ([place])   . Dementia [facility])    Past Surgical History:  Procedure Laterality Date  . ABDOMINAL HYSTERECTOMY    . CLOSED REDUCTION WRIST FRACTURE Right 06/28/2020   Procedure: CLOSED REDUCTION WRIST;  Surgeon: Lovell Sheehan, MD;  Location: [facility] ORS;  Service: Orthopedics;  Laterality: Right;  . HIP ARTHROPLASTY Right 06/28/2020   Procedure: ARTHROPLASTY BIPOLAR HIP (HEMIARTHROPLASTY);  Surgeon: Lovell Sheehan, MD;  Location: [facility] ORS;  Service: Orthopedics;  Laterality: Right;   Social History:  reports that she has never smoked. She has never used smokeless tobacco. She reports previous alcohol use. She reports that she does not use drugs.  Allergies  Allergen Reactions  . Lisinopril Other (See Comments)   No family history on file. Family history: Family history reviewed and not pertinent  Prior to Admission medications   Medication Sig Start Date End Date Taking? Authorizing Provider  apixaban (ELIQUIS) 2.5 MG TABS tablet Take 1 tablet (2.5 mg total) by mouth 2 (two) times daily. 07/06/20   Lorella Nimrod, MD  Cyanocobalamin 1000 MCG LOZG Place 1 lozenge under the tongue daily.    [provider]  docusate sodium (COLACE) 100 MG capsule Take 1 capsule (100 mg total) by mouth 2 (two) times daily. 07/06/20   Lorella Nimrod, MD  epoetin alfa (EPOGEN) 4000 UNIT/ML injection Inject 0.75 mLs (3,000 Units total) into the skin every 14 (fourteen) days. 07/18/20   Lorella Nimrod, MD  feeding supplement (ENSURE ENLIVE / ENSURE PLUS) LIQD Take 237 mLs by mouth 3 (three) times daily between meals. 07/06/20   Lorella Nimrod, MD  ferrous sulfate 325 (65 FE) MG tablet Take 1 tablet  (325 mg total) by mouth daily with breakfast. 07/06/20   Lorella Nimrod, MD  furosemide (LASIX) 20 MG tablet Take 0.5 tablets (10 mg total) by mouth daily. 07/06/20   Lorella Nimrod, MD  HYDROcodone-acetaminophen (NORCO/VICODIN) 5-325 MG tablet Take 1-2 tablets by mouth every 6 (six) hours as needed for moderate pain. 07/06/20   Lorella Nimrod, MD  lidocaine (LIDODERM) 5 % Place 1 patch onto the skin daily. Remove & Discard patch within 12 hours or as directed by MD 07/06/20   Lorella Nimrod, MD  liver oil-zinc oxide (DESITIN) 40 % ointment Apply topically as needed for irritation. 07/06/20   Lorella Nimrod, MD  losartan (COZAAR) 25 MG tablet Take 25 mg by mouth daily. 05/05/20   [provider]  metoprolol tartrate (LOPRESSOR) 25 MG tablet Take 1 tablet (25 mg total) by mouth every 6 (six) hours. 07/06/20   Lorella Nimrod, MD  Multiple Vitamin (MULTIVITAMIN WITH MINERALS) TABS tablet Take 1 tablet by mouth daily. 07/06/20   Lorella Nimrod, MD  omeprazole (PRILOSEC) 20 MG capsule Take 20 mg by mouth daily. 04/17/20   [provider]  polyethylene glycol (MIRALAX / GLYCOLAX) 17 g packet Take 17 g by mouth daily as needed for mild constipation or moderate constipation. 07/06/20   Lorella Nimrod, MD  potassium chloride (KLOR-CON) 8 MEQ tablet Take 8 mEq  by mouth daily. 06/12/20   [provider]  QUEtiapine (SEROQUEL) 25 MG tablet Take 25 mg by mouth daily. Takes at 5 pm 06/16/20   [provider]  sodium chloride 1 g tablet Take 1 tablet (1 g total) by mouth daily. 07/06/20   Lorella Nimrod, MD   Physical Exam: Vitals:   07/31/20 1451 07/31/20 1457 07/31/20 1754  BP: (!) 171/105  (!) 168/98  Pulse: (!) 134 (!) 103 (!) 129  Resp: (!) 24  (!) 21  Temp: (!) 97.5 F (36.4 C)    TempSrc: Oral    SpO2: 97%  92%   Constitutional: appears age appropriate, fail, cachectic, NAD, calm, comfortable Eyes: PERRL, lids and conjunctivae normal ENMT: Mucous membranes are moist. Posterior  pharynx clear of any exudate or lesions. Age-appropriate dentition. Hearing appropriate Neck: normal, supple, no masses, no thyromegaly Respiratory: clear to auscultation bilaterally, no wheezing, no crackles. Normal respiratory effort. No accessory muscle use.  Cardiovascular: Elevated heart rate, irregular rhythm, no murmurs / rubs / gallops. No extremity edema. 2+ pedal pulses. No carotid bruits.  Abdomen: Scaphoid abdomen, no tenderness, no masses palpated, no hepatosplenomegaly. Bowel sounds positive.  Musculoskeletal: no clubbing / cyanosis. No joint deformity upper and lower extremities.  Decreased ROM of the right lower extremity, no contractures, no atrophy.  Bilateral lower and upper extremity muscle atrophy.  Right thigh swelling and pain. Skin: no rashes, lesions, ulcers. No induration.  Ecchymosis of the lower extremity present on admission Neurologic: Sensation intact. Strength 5/5 in all 4.  Psychiatric: Normal judgment and insight. Alert and oriented x self, location of hospital and daughter at bedside.  She was not able to tell me her age. Normal mood.   EKG: independently reviewed, showing atrial fibrillation with rate of 115, QTc 480  Chest x-ray on Admission: I personally reviewed and I agree with radiologist reading as below.  DG Chest 1 View  Result Date: 07/31/2020 CLINICAL DATA:  Tripped and fell on right hip, recent right hip surgery EXAM: CHEST  1 VIEW COMPARISON:  06/24/2020 FINDINGS: 2 frontal views of the chest demonstrate stable single lead pacer. Cardiac silhouette is enlarged. No airspace disease, effusion, or pneumothorax. There are no acute displaced fractures. IMPRESSION: 1. No acute intrathoracic process. Electronically Signed   By: Randa Ngo M.D.   On: 07/31/2020 15:41   DG Pelvis 1-2 Views  Result Date: 07/31/2020 CLINICAL DATA:  Tripped and fell, recent right hip surgery EXAM: PELVIS - 1-2 VIEW COMPARISON:  06/28/2020 FINDINGS: Single frontal view of  the pelvis was performed, including both hips. Right hip hemiarthroplasty is partially identified. There is a periprosthetic fracture involving the femur, only partially included on this exam. Please refer to dedicated right femur series for complete characterization and visualization. The remainder of the bony pelvis is unremarkable. The left hip is well aligned, with mild osteoarthritis. IMPRESSION: 1. Partial visualization of a periprosthetic fracture of the right femur. Please refer to dedicated right femur evaluation. 2. Otherwise unremarkable bony pelvis. Electronically Signed   By: Randa Ngo M.D.   On: 07/31/2020 15:46   DG FEMUR, MIN 2 VIEWS RIGHT  Result Date: 07/31/2020 CLINICAL DATA:  Fall over curb, right hip pain EXAM: RIGHT FEMUR 2 VIEWS COMPARISON:  06/28/2020 FINDINGS: There is an an oblique, displaced perihardware fracture about the femoral component of a right hip total arthroplasty. Osteopenia. No displaced fracture of the included pelvis. IMPRESSION: There is an oblique, displaced perihardware fracture about the femoral component of a right  hip total arthroplasty. Electronically Signed   By: Eddie Candle M.D.   On: 07/31/2020 15:33   Labs on Admission: I have personally reviewed following labs  CBC: Recent Labs  Lab 07/31/20 1450  WBC 9.6  NEUTROABS 6.1  HGB 10.6*  HCT 33.0*  MCV 90.9  PLT 419   Basic Metabolic Panel: Recent Labs  Lab 07/31/20 1450  NA 129*  K 4.6  CL 96*  CO2 23  GLUCOSE 187*  BUN 27*  CREATININE 0.99  CALCIUM 8.7*   Urine analysis:    Component Value Date/Time   COLORURINE YELLOW (A) 06/25/2020 0350   APPEARANCEUR HAZY (A) 06/25/2020 0350   APPEARANCEUR Hazy 08/05/2011 1551   LABSPEC 1.013 06/25/2020 0350   LABSPEC 1.014 08/05/2011 1551   PHURINE 8.0 06/25/2020 0350   GLUCOSEU NEGATIVE 06/25/2020 0350   GLUCOSEU Negative 08/05/2011 1551   HGBUR SMALL (A) 06/25/2020 0350   BILIRUBINUR NEGATIVE 06/25/2020 0350   BILIRUBINUR  Negative 08/05/2011 1551   KETONESUR NEGATIVE 06/25/2020 0350   PROTEINUR 30 (A) 06/25/2020 0350   NITRITE NEGATIVE 06/25/2020 0350   LEUKOCYTESUR NEGATIVE 06/25/2020 0350   LEUKOCYTESUR 3+ 08/05/2011 1551   Jahree Dermody N Oluwafemi Villella D.O. [facility]  If 7PM-7AM, please contact overnight-coverage provider If 7AM-7PM, please contact day coverage provider [email or web address]  07/31/2020, 7:10 PM

## 2020-07-31 NOTE — ED Triage Notes (Signed)
Pt BIB EMS after a mechanical trip and fall on the right hip. Pt had surgery to her right hip 3-4 weeks ago. Pt received of IM fentanyl and of IV fentanyl by EMS. 18G L AC, IV. EMS states pt is alert and oriented at baseline

## 2020-08-01 DIAGNOSIS — W010XXA Fall on same level from slipping, tripping and stumbling without subsequent striking against object, initial encounter: Secondary | ICD-10-CM | POA: Diagnosis present

## 2020-08-01 DIAGNOSIS — Z7901 Long term (current) use of anticoagulants: Secondary | ICD-10-CM | POA: Diagnosis not present

## 2020-08-01 DIAGNOSIS — D72828 Other elevated white blood cell count: Secondary | ICD-10-CM | POA: Diagnosis present

## 2020-08-01 DIAGNOSIS — Z950 Presence of cardiac pacemaker: Secondary | ICD-10-CM | POA: Diagnosis not present

## 2020-08-01 DIAGNOSIS — Z20822 Contact with and (suspected) exposure to covid-19: Secondary | ICD-10-CM | POA: Diagnosis present

## 2020-08-01 DIAGNOSIS — E871 Hypo-osmolality and hyponatremia: Secondary | ICD-10-CM | POA: Diagnosis present

## 2020-08-01 DIAGNOSIS — F05 Delirium due to known physiological condition: Secondary | ICD-10-CM | POA: Diagnosis not present

## 2020-08-01 DIAGNOSIS — E559 Vitamin D deficiency, unspecified: Secondary | ICD-10-CM | POA: Diagnosis present

## 2020-08-01 DIAGNOSIS — G4700 Insomnia, unspecified: Secondary | ICD-10-CM | POA: Diagnosis present

## 2020-08-01 DIAGNOSIS — E43 Unspecified severe protein-calorie malnutrition: Secondary | ICD-10-CM | POA: Diagnosis present

## 2020-08-01 DIAGNOSIS — R739 Hyperglycemia, unspecified: Secondary | ICD-10-CM | POA: Diagnosis present

## 2020-08-01 DIAGNOSIS — D62 Acute posthemorrhagic anemia: Secondary | ICD-10-CM | POA: Diagnosis not present

## 2020-08-01 DIAGNOSIS — R64 Cachexia: Secondary | ICD-10-CM | POA: Diagnosis present

## 2020-08-01 DIAGNOSIS — G309 Alzheimer's disease, unspecified: Secondary | ICD-10-CM | POA: Diagnosis present

## 2020-08-01 DIAGNOSIS — S72001A Fracture of unspecified part of neck of right femur, initial encounter for closed fracture: Secondary | ICD-10-CM | POA: Diagnosis present

## 2020-08-01 DIAGNOSIS — I739 Peripheral vascular disease, unspecified: Secondary | ICD-10-CM | POA: Diagnosis present

## 2020-08-01 DIAGNOSIS — E860 Dehydration: Secondary | ICD-10-CM | POA: Diagnosis present

## 2020-08-01 DIAGNOSIS — N179 Acute kidney failure, unspecified: Secondary | ICD-10-CM | POA: Diagnosis present

## 2020-08-01 DIAGNOSIS — Z66 Do not resuscitate: Secondary | ICD-10-CM | POA: Diagnosis present

## 2020-08-01 DIAGNOSIS — F0280 Dementia in other diseases classified elsewhere without behavioral disturbance: Secondary | ICD-10-CM | POA: Diagnosis present

## 2020-08-01 DIAGNOSIS — I110 Hypertensive heart disease with heart failure: Secondary | ICD-10-CM | POA: Diagnosis present

## 2020-08-01 DIAGNOSIS — Z681 Body mass index (BMI) 19 or less, adult: Secondary | ICD-10-CM | POA: Diagnosis not present

## 2020-08-01 DIAGNOSIS — I4821 Permanent atrial fibrillation: Secondary | ICD-10-CM | POA: Diagnosis present

## 2020-08-01 DIAGNOSIS — N390 Urinary tract infection, site not specified: Secondary | ICD-10-CM | POA: Diagnosis present

## 2020-08-01 DIAGNOSIS — I5032 Chronic diastolic (congestive) heart failure: Secondary | ICD-10-CM | POA: Diagnosis present

## 2020-08-01 DIAGNOSIS — M9701XA Periprosthetic fracture around internal prosthetic right hip joint, initial encounter: Secondary | ICD-10-CM | POA: Diagnosis present

## 2020-08-01 LAB — URINALYSIS, COMPLETE (UACMP) WITH MICROSCOPIC
Bilirubin Urine: NEGATIVE
Glucose, UA: NEGATIVE mg/dL
Hgb urine dipstick: NEGATIVE
Ketones, ur: NEGATIVE mg/dL
Leukocytes,Ua: NEGATIVE
Nitrite: NEGATIVE
Protein, ur: 30 mg/dL — AB
Specific Gravity, Urine: 1.021 (ref 1.005–1.030)
pH: 5 (ref 5.0–8.0)

## 2020-08-01 LAB — CBC
HCT: 26.1 % — ABNORMAL LOW (ref 36.0–46.0)
Hemoglobin: 8.8 g/dL — ABNORMAL LOW (ref 12.0–15.0)
MCH: 29.8 pg (ref 26.0–34.0)
MCHC: 33.7 g/dL (ref 30.0–36.0)
MCV: 88.5 fL (ref 80.0–100.0)
Platelets: 194 10*3/uL (ref 150–400)
RBC: 2.95 MIL/uL — ABNORMAL LOW (ref 3.87–5.11)
RDW: 14.6 % (ref 11.5–15.5)
WBC: 10 10*3/uL (ref 4.0–10.5)
nRBC: 0 % (ref 0.0–0.2)

## 2020-08-01 LAB — MAGNESIUM: Magnesium: 1.8 mg/dL (ref 1.7–2.4)

## 2020-08-01 LAB — BASIC METABOLIC PANEL
Anion gap: 8 (ref 5–15)
BUN: 32 mg/dL — ABNORMAL HIGH (ref 8–23)
CO2: 25 mmol/L (ref 22–32)
Calcium: 8.9 mg/dL (ref 8.9–10.3)
Chloride: 94 mmol/L — ABNORMAL LOW (ref 98–111)
Creatinine, Ser: 0.84 mg/dL (ref 0.44–1.00)
GFR, Estimated: >60 mL/min (ref >60)
Glucose, Bld: 130 mg/dL — ABNORMAL HIGH (ref 70–99)
Potassium: 4.7 mmol/L (ref 3.5–5.1)
Sodium: 127 mmol/L — ABNORMAL LOW (ref 135–145)

## 2020-08-01 LAB — CK: Total CK: 86 U/L (ref 38–234)

## 2020-08-01 LAB — VITAMIN D 25 HYDROXY (VIT D DEFICIENCY, FRACTURES): Vit D, 25-Hydroxy: 26.04 ng/mL — ABNORMAL LOW (ref 30–100)

## 2020-08-01 MED ORDER — SODIUM CHLORIDE 0.9 % IV SOLN: INTRAVENOUS | Status: AC

## 2020-08-01 MED ORDER — CHLORHEXIDINE GLUCONATE CLOTH 2 % EX PADS
6.0000 | MEDICATED_PAD | Freq: Every day | CUTANEOUS | Status: DC
  Administered 2020-08-01 – 2020-08-05 (×5): 6 via TOPICAL

## 2020-08-01 NOTE — Progress Notes (Signed)
Notified NP of no urine output this shift. Bladder scan produced 348. Continuing to monitor. Continue to encourage fluids and pass on to morning shift nurse.

## 2020-08-01 NOTE — Consult Note (Signed)
ORTHOPAEDIC CONSULTATION  REQUESTING PHYSICIAN: Darlin Drop, DO  Chief Complaint: Right hip pain status post fall  HPI: Kristen Osborn is a 87 y.o. female who is seen in her hospital room with her daughter at the bedside today.  Patient sustained a fall yesterday while out for lunch with her daughter.  Patient sustained a mechanical fall.  The patient has dementia and her daughter provides most of the history.  She denies syncope or loss of consciousness.  The patient states she is comfortable and not having significant pain currently.  Past Medical History:  Diagnosis Date  . Atrial fibrillation (HCC)   . CHF (congestive heart failure) (HCC)   . Dementia [facility])    Past Surgical History:  Procedure Laterality Date  . ABDOMINAL HYSTERECTOMY    . CLOSED REDUCTION WRIST FRACTURE Right 06/28/2020   Procedure: CLOSED REDUCTION WRIST;  Surgeon: Lyndle Herrlich, MD;  Location: [facility] ORS;  Service: Orthopedics;  Laterality: Right;  . HIP ARTHROPLASTY Right 06/28/2020   Procedure: ARTHROPLASTY BIPOLAR HIP (HEMIARTHROPLASTY);  Surgeon: Lyndle Herrlich, MD;  Location: [facility] ORS;  Service: Orthopedics;  Laterality: Right;   Social History   Socioeconomic History  . Marital status: Married    Spouse name: Not on file  . Number of children: Not on file  . Years of education: Not on file  . Highest education level: Not on file  Occupational History  . Not on file  Tobacco Use  . Smoking status: Never Smoker  . Smokeless tobacco: Never Used  Vaping Use  . Vaping Use: Never used  Substance and Sexual Activity  . Alcohol use: Not Currently  . Drug use: Never  . Sexual activity: Not Currently  Other Topics Concern  . Not on file  Social History Narrative   lives with daughter   Social Determinants of Health   Financial Resource Strain: Not on file  Food Insecurity: Not on file  Transportation Needs: Not on file  Physical Activity: Not on file  Stress: Not on file  Social  Connections: Not on file   No family history on file. Allergies  Allergen Reactions  . Lisinopril Other (See Comments)   Prior to Admission medications   Medication Sig Start Date End Date Taking? Authorizing Provider  apixaban (ELIQUIS) 2.5 MG TABS tablet Take 1 tablet (2.5 mg total) by mouth 2 (two) times daily. 07/06/20  Yes Arnetha Courser, MD  cefUROXime (CEFTIN) 250 MG tablet Take 1 tablet by mouth in the morning and at bedtime. 07/31/20 08/07/20 Yes [provider]  Cyanocobalamin 1000 MCG LOZG Place 1 lozenge under the tongue daily.   Yes [provider]  losartan (COZAAR) 25 MG tablet Take 25 mg by mouth daily. 05/05/20  Yes [provider]  metoprolol tartrate (LOPRESSOR) 25 MG tablet Take 1 tablet (25 mg total) by mouth every 6 (six) hours. 07/06/20  Yes Arnetha Courser, MD  Multiple Vitamin (MULTIVITAMIN WITH MINERALS) TABS tablet Take 1 tablet by mouth daily. 07/06/20  Yes Arnetha Courser, MD  omeprazole (PRILOSEC) 20 MG capsule Take 20 mg by mouth daily. 04/17/20  Yes [provider]  potassium chloride (KLOR-CON) 10 MEQ tablet Take 10 mEq by mouth daily.   Yes [provider]  QUEtiapine (SEROQUEL) 25 MG tablet Take 25 mg by mouth daily. Takes at 5 pm 06/16/20  Yes [provider]  docusate sodium (COLACE) 100 MG capsule Take 1 capsule (100 mg total) by mouth 2 (two) times daily. 07/06/20  Arnetha Courser, MD  feeding supplement (ENSURE ENLIVE / ENSURE PLUS) LIQD Take 237 mLs by mouth 3 (three) times daily between meals. 07/06/20   Arnetha Courser, MD  HYDROcodone-acetaminophen (NORCO/VICODIN) 5-325 MG tablet Take 1-2 tablets by mouth every 6 (six) hours as needed for moderate pain. Patient not taking: No sig reported 07/06/20   Arnetha Courser, MD  lidocaine (LIDODERM) 5 % Place 1 patch onto the skin daily. Remove & Discard patch within 12 hours or as directed by MD Patient not taking: No sig reported 07/06/20   Arnetha Courser, MD  liver oil-zinc  oxide (DESITIN) 40 % ointment Apply topically as needed for irritation. 07/06/20   Arnetha Courser, MD  polyethylene glycol (MIRALAX / GLYCOLAX) 17 g packet Take 17 g by mouth daily as needed for mild constipation or moderate constipation. 07/06/20   Arnetha Courser, MD   DG Chest 1 View  Result Date: 07/31/2020 CLINICAL DATA:  Tripped and fell on right hip, recent right hip surgery EXAM: CHEST  1 VIEW COMPARISON:  06/24/2020 FINDINGS: 2 frontal views of the chest demonstrate stable single lead pacer. Cardiac silhouette is enlarged. No airspace disease, effusion, or pneumothorax. There are no acute displaced fractures. IMPRESSION: 1. No acute intrathoracic process. Electronically Signed   By: Sharlet Salina M.D.   On: 07/31/2020 15:41   DG Pelvis 1-2 Views  Result Date: 07/31/2020 CLINICAL DATA:  Tripped and fell, recent right hip surgery EXAM: PELVIS - 1-2 VIEW COMPARISON:  06/28/2020 FINDINGS: Single frontal view of the pelvis was performed, including both hips. Right hip hemiarthroplasty is partially identified. There is a periprosthetic fracture involving the femur, only partially included on this exam. Please refer to dedicated right femur series for complete characterization and visualization. The remainder of the bony pelvis is unremarkable. The left hip is well aligned, with mild osteoarthritis. IMPRESSION: 1. Partial visualization of a periprosthetic fracture of the right femur. Please refer to dedicated right femur evaluation. 2. Otherwise unremarkable bony pelvis. Electronically Signed   By: Sharlet Salina M.D.   On: 07/31/2020 15:46   DG FEMUR, MIN 2 VIEWS RIGHT  Result Date: 07/31/2020 CLINICAL DATA:  Fall over curb, right hip pain EXAM: RIGHT FEMUR 2 VIEWS COMPARISON:  06/28/2020 FINDINGS: There is an an oblique, displaced perihardware fracture about the femoral component of a right hip total arthroplasty. Osteopenia. No displaced fracture of the included pelvis. IMPRESSION: There is an oblique,  displaced perihardware fracture about the femoral component of a right hip total arthroplasty. Electronically Signed   By: Lauralyn Primes M.D.   On: 07/31/2020 15:33    Positive ROS: All other systems have been reviewed and were otherwise negative with the exception of those mentioned in the HPI and as above.  Physical Exam: General: Awake, lying in bed, no acute distress  MUSCULOSKELETAL: Right lower extremity: Patient skin is intact.  Patient has swelling in the right thigh but her compartments are soft and compressible.  Distally she has palpable pedal pulses, intact 6 light touch and intact motor function.  There is mild shortening of the right lower extremity without significant rotational deformity.  Assessment: Right periprosthetic hip fracture  Plan: I reviewed the patient's x-rays.  She has a femur fracture below the distal tip of her hemiarthroplasty prosthesis.  Dr. Odis Luster had performed her hemiarthroplasty surgery.  I have discussed this case with him.  He is reviewed the x-rays.  Dr. Odis Luster has agreed to take care of the patient.  She will have surgical fixation of  her fracture on Monday or Tuesday.    The patient will be n.p.o. after midnight in preparation for possible surgery on Monday.  Patient should discontinue heparin at midnight on Sunday night as well.  Patient should remain off her Eliquis until after surgery.  Patient and her daughter are in agreement with this plan..  I answered their questions today.   Juanell Fairly, MD    08/01/2020 5:16 PM

## 2020-08-01 NOTE — Progress Notes (Addendum)
PROGRESS NOTE  ELIOT BENCIVENGA DUK:[id number] DOB: 12-23-1926 DOA: 07/31/2020 PCP: Danella Penton, MD  HPI/Recap of past 24 hours:  Kristen Osborn is a 87 y.o. female with medical history significant for primary hypertension, peripheral artery disease, atrial fibrillation on Eliquis, diastolic heart failure, Alzheimer dementia, status post permanent pacemaker, presents to the emergency department for chief concerns of a witnessed fall after tripping.  Patient was out having lunch with her daughter.  At baseline she ambulates with a walker.  Her daughter had helped her get into the car and went to the other side to get into the car herself.  Patient then opens the door and attempts to ambulate on her own stating to her daughter that she needed to use the restroom.  She then tripped and fell at the threshold of the sidewalk elevation.  Negative for head trauma with weakness or loss of consciousness.  Since the fall, patient endorses 10 out of 10 pain of the right hip.  Work-up revealed periprosthetic fracture of the right femur.  Orthopedic surgery consulted, surgical intervention delayed due to recent use of DOAC, Eliquis, which is on hold.  08/01/20: Patient was seen and examined at her bedside.  Her daughter was present.  She denies having any pain at the time of this visit.  She has not been able to void.  Bladder scan showing urinary retention with greater than 300 cc of urine retained.  We will insert a Foley catheter.   Assessment/Plan: Principal Problem:   Closed right hip fracture (HCC) Active Problems:   Alzheimer's dementia without behavioral disturbance (HCC)   Atrial fibrillation and flutter (HCC)   Benign essential HTN   PAD (peripheral artery disease) (HCC)   Status post placement of cardiac pacemaker   Chronic anticoagulation   Closed displaced fracture of right femoral neck (HCC)   Protein-calorie malnutrition, severe  Closed right hip fracture post mechanical  fall Oblique displaced perihardware fracture about the femoral component of the right hip total arthroplasty seen on x-ray.\ Orthopedic surgery consulted, recommended to continue to hold off Eliquis, possible surgical intervention in a few days. Continue pain control Management per orthopedic surgery.  Chronic hyponatremia on salt tablets prior to admission Serum sodium 127, she is currently asymptomatic. Continue home salt tablets Monitor sodium level  AKI, likely prerenal in the setting of dehydration, poor oral intake Baseline creatinine appears to be 0.5 with GFR greater than 60 Presented with creatinine of 0.99 with GFR 54. Start gentle IV fluid hydration normal saline at 50 cc/h x 2 days. Encourage oral intake.  Acute urinary retention Unable to void this morning Greater than 300 cc urine retained from bladder scan Monitor urine output We will do a voiding trial after her surgery.  Hyperglycemia with no known history of diabetes Presented withSerum glucose 187 Will obtain hemoglobin A1c We will start insulin sliding scale as needed  Chronic normocytic anemia Drop in hemoglobin this morning down to 8.8 from 10.6 which appears to be her baseline.\ Closely monitor H&H Maintain hemoglobin greater than 8.0 due to planned surgical procedure.  Physical debility PT OT to assess post surgery with guidance of orthopedic surgery. Continue fall precautions.  Per her daughter she was recently treated for urinary tract infection at outside facility Will obtain a UA and urine culture Home oral antibiotics on hold She was started on Rocephin empirically Follow urine analysis and urine culture.    Code Status: DNR  Family Communication: Daughter at bedside  Disposition Plan:  Home with home health services versus SNF.   Consultants:  Orthopedic surgery  Procedures:  None.  Antimicrobials:  Rocephin  DVT prophylaxis: SCDs.  Status is: Observation  Patient will  likely require at least 2 midnight for further evaluation and treatment of present condition.  Dispo:  Patient From: Home  Planned Disposition: Home with home health services versus SNF.  Medically stable for discharge: No, management of right hip fracture.       Objective: Vitals:   08/01/20 0015 08/01/20 0306 08/01/20 0815 08/01/20 1043  BP: 111/78 (!) 143/82 118/81   Pulse: (!) 106 (!) 111 89   Resp:   16   Temp:  98.3 F (36.8 C) 98.7 F (37.1 C)   TempSrc:  Oral Oral   SpO2: 96% 95% 94%   Weight:    47 kg  Height:        Intake/Output Summary (Last 24 hours) at 08/01/2020 1103 Last data filed at 08/01/2020 0940 Gross per 24 hour  Intake 120 ml  Output 0 ml  Net 120 ml   Filed Weights   07/31/20 1903 08/01/20 1043  Weight: 48.6 kg 47 kg    Exam:  . General: 87 y.o. year-old female well developed well nourished in no acute distress.  Alert and pleasantly demented. . Cardiovascular: Regular rate and rhythm with no rubs or gallops.  No thyromegaly or JVD noted.   Marland Kitchen Respiratory: Clear to auscultation with no wheezes or rales. Good inspiratory effort. . Abdomen: Soft nontender nondistended with normal bowel sounds x4 quadrants. . Musculoskeletal: Right lower extremity edema. . Skin: No ulcerative lesions noted or rashes . Psychiatry: Mood is appropriate for condition and setting   Data Reviewed: CBC: Recent Labs  Lab 07/31/20 1450 08/01/20 0811  WBC 9.6 10.0  NEUTROABS 6.1  --   HGB 10.6* 8.8*  HCT 33.0* 26.1*  MCV 90.9 88.5  PLT 239 194   Basic Metabolic Panel: Recent Labs  Lab 07/31/20 1450 08/01/20 0747  NA 129* 127*  K 4.6 4.7  CL 96* 94*  CO2 23 25  GLUCOSE 187* 130*  BUN 27* 32*  CREATININE 0.99 0.84  CALCIUM 8.7* 8.9  MG  --  1.8   GFR: Estimated Creatinine Clearance: 32.4 mL/min (by C-G formula based on SCr of 0.84 mg/dL). Liver Function Tests: No results for input(s): AST, ALT, ALKPHOS, BILITOT, PROT, ALBUMIN in the last 168  hours. No results for input(s): LIPASE, AMYLASE in the last 168 hours. No results for input(s): AMMONIA in the last 168 hours. Coagulation Profile: No results for input(s): INR, PROTIME in the last 168 hours. Cardiac Enzymes: Recent Labs  Lab 07/31/20 1450 08/01/20 0747  CKTOTAL 106 86   BNP (last 3 results) No results for input(s): PROBNP in the last 8760 hours. HbA1C: No results for input(s): HGBA1C in the last 72 hours. CBG: No results for input(s): GLUCAP in the last 168 hours. Lipid Profile: No results for input(s): CHOL, HDL, LDLCALC, TRIG, CHOLHDL, LDLDIRECT in the last 72 hours. Thyroid Function Tests: No results for input(s): TSH, T4TOTAL, FREET4, T3FREE, THYROIDAB in the last 72 hours. Anemia Panel: No results for input(s): VITAMINB12, FOLATE, FERRITIN, TIBC, IRON, RETICCTPCT in the last 72 hours. Urine analysis:    Component Value Date/Time   COLORURINE YELLOW (A) 06/25/2020 0350   APPEARANCEUR HAZY (A) 06/25/2020 0350   APPEARANCEUR Hazy 08/05/2011 1551   LABSPEC 1.013 06/25/2020 0350   LABSPEC 1.014 08/05/2011 1551   PHURINE 8.0 06/25/2020 0350  GLUCOSEU NEGATIVE 06/25/2020 0350   GLUCOSEU Negative 08/05/2011 1551   HGBUR SMALL (A) 06/25/2020 0350   BILIRUBINUR NEGATIVE 06/25/2020 0350   BILIRUBINUR Negative 08/05/2011 1551   KETONESUR NEGATIVE 06/25/2020 0350   PROTEINUR 30 (A) 06/25/2020 0350   NITRITE NEGATIVE 06/25/2020 0350   LEUKOCYTESUR NEGATIVE 06/25/2020 0350   LEUKOCYTESUR 3+ 08/05/2011 1551   Sepsis Labs: @LABRCNTIP (procalcitonin:4,lacticidven:4)  ) Recent Results (from the past 240 hour(s))  Resp Panel by RT-PCR (Flu A&B, Covid) Nasopharyngeal Swab     Status: None   Collection Time: 07/31/20  4:20 PM   Specimen: Nasopharyngeal Swab; Nasopharyngeal(NP) swabs in vial transport medium  Result Value Ref Range Status   SARS Coronavirus 2 by RT PCR NEGATIVE NEGATIVE Final    Comment: (NOTE) SARS-CoV-2 target nucleic acids are NOT  DETECTED.  The SARS-CoV-2 RNA is generally detectable in upper respiratory specimens during the acute phase of infection. The lowest concentration of SARS-CoV-2 viral copies this assay can detect is 138 copies/mL. A negative result does not preclude SARS-Cov-2 infection and should not be used as the sole basis for treatment or other patient management decisions. A negative result may occur with  improper specimen collection/handling, submission of specimen other than nasopharyngeal swab, presence of viral mutation(s) within the areas targeted by this assay, and inadequate number of viral copies(<138 copies/mL). A negative result must be combined with clinical observations, patient history, and epidemiological information. The expected result is Negative.  Fact Sheet for Patients:  08/02/20  Fact Sheet for Healthcare Providers:  BloggerCourse.com  This test is no t yet approved or cleared by the SeriousBroker.it FDA and  has been authorized for detection and/or diagnosis of SARS-CoV-2 by FDA under an Emergency Use Authorization (EUA). This EUA will remain  in effect (meaning this test can be used) for the duration of the COVID-19 declaration under Section 564(b)(1) of the Act, 21 U.S.C.section 360bbb-3(b)(1), unless the authorization is terminated  or revoked sooner.       Influenza A by PCR NEGATIVE NEGATIVE Final   Influenza B by PCR NEGATIVE NEGATIVE Final    Comment: (NOTE) The Xpert Xpress SARS-CoV-2/FLU/RSV plus assay is intended as an aid in the diagnosis of influenza from Nasopharyngeal swab specimens and should not be used as a sole basis for treatment. Nasal washings and aspirates are unacceptable for Xpert Xpress SARS-CoV-2/FLU/RSV testing.  Fact Sheet for Patients: [place]  Fact Sheet for Healthcare Providers: BloggerCourse.com  This test is not yet  approved or cleared by the SeriousBroker.it FDA and has been authorized for detection and/or diagnosis of SARS-CoV-2 by FDA under an Emergency Use Authorization (EUA). This EUA will remain in effect (meaning this test can be used) for the duration of the COVID-19 declaration under Section 564(b)(1) of the Act, 21 U.S.C. section 360bbb-3(b)(1), unless the authorization is terminated or revoked.  Performed at [facility], [address] [place], [place]       Studies: DG Chest 1 View  Result Date: 07/31/2020 CLINICAL DATA:  Tripped and fell on right hip, recent right hip surgery EXAM: CHEST  1 VIEW COMPARISON:  06/24/2020 FINDINGS: 2 frontal views of the chest demonstrate stable single lead pacer. Cardiac silhouette is enlarged. No airspace disease, effusion, or pneumothorax. There are no acute displaced fractures. IMPRESSION: 1. No acute intrathoracic process. Electronically Signed   By: 06/26/2020 M.D.   On: 07/31/2020 15:41   DG Pelvis 1-2 Views  Result Date: 07/31/2020 CLINICAL DATA:  Tripped and fell, recent  right hip surgery EXAM: PELVIS - 1-2 VIEW COMPARISON:  06/28/2020 FINDINGS: Single frontal view of the pelvis was performed, including both hips. Right hip hemiarthroplasty is partially identified. There is a periprosthetic fracture involving the femur, only partially included on this exam. Please refer to dedicated right femur series for complete characterization and visualization. The remainder of the bony pelvis is unremarkable. The left hip is well aligned, with mild osteoarthritis. IMPRESSION: 1. Partial visualization of a periprosthetic fracture of the right femur. Please refer to dedicated right femur evaluation. 2. Otherwise unremarkable bony pelvis. Electronically Signed   By: Sharlet SalinaMichael  Brown M.D.   On: 07/31/2020 15:46   DG FEMUR, MIN 2 VIEWS RIGHT  Result Date: 07/31/2020 CLINICAL DATA:  Fall over curb, right hip pain EXAM: RIGHT FEMUR 2 VIEWS  COMPARISON:  06/28/2020 FINDINGS: There is an an oblique, displaced perihardware fracture about the femoral component of a right hip total arthroplasty. Osteopenia. No displaced fracture of the included pelvis. IMPRESSION: There is an oblique, displaced perihardware fracture about the femoral component of a right hip total arthroplasty. Electronically Signed   By: Lauralyn PrimesAlex  Bibbey M.D.   On: 07/31/2020 15:33    Scheduled Meds: . docusate sodium  100 mg Oral BID  . feeding supplement  237 mL Oral TID BM  . ferrous sulfate  325 mg Oral Q breakfast  . heparin  5,000 Units Subcutaneous Q8H  . losartan  25 mg Oral Daily  . metoprolol tartrate  25 mg Oral Q6H  . multivitamin with minerals  1 tablet Oral Daily  . pantoprazole  40 mg Oral Daily  . QUEtiapine  25 mg Oral QHS  . sodium chloride  1 g Oral Daily  . vitamin B-12  1,000 mcg Oral Daily    Continuous Infusions: . cefTRIAXone (ROCEPHIN)  IV       LOS: 0 days     Darlin Droparole N Kennia Vanvorst, MD [facility] Pager [phone]  If 7PM-7AM, please contact night-coverage [email or web address] Password [email or web address] 08/01/2020, 11:03 AM

## 2020-08-02 LAB — CBC
HCT: 20 % — ABNORMAL LOW (ref 36.0–46.0)
Hemoglobin: 6.5 g/dL — ABNORMAL LOW (ref 12.0–15.0)
MCH: 29.1 pg (ref 26.0–34.0)
MCHC: 32.5 g/dL (ref 30.0–36.0)
MCV: 89.7 fL (ref 80.0–100.0)
Platelets: 138 10*3/uL — ABNORMAL LOW (ref 150–400)
RBC: 2.23 MIL/uL — ABNORMAL LOW (ref 3.87–5.11)
RDW: 14.7 % (ref 11.5–15.5)
WBC: 8.9 10*3/uL (ref 4.0–10.5)
nRBC: 0 % (ref 0.0–0.2)

## 2020-08-02 LAB — MAGNESIUM: Magnesium: 1.9 mg/dL (ref 1.7–2.4)

## 2020-08-02 LAB — BASIC METABOLIC PANEL
Anion gap: 8 (ref 5–15)
BUN: 28 mg/dL — ABNORMAL HIGH (ref 8–23)
CO2: 24 mmol/L (ref 22–32)
Calcium: 8.2 mg/dL — ABNORMAL LOW (ref 8.9–10.3)
Chloride: 96 mmol/L — ABNORMAL LOW (ref 98–111)
Creatinine, Ser: 0.83 mg/dL (ref 0.44–1.00)
GFR, Estimated: >60 mL/min (ref >60)
Glucose, Bld: 140 mg/dL — ABNORMAL HIGH (ref 70–99)
Potassium: 4 mmol/L (ref 3.5–5.1)
Sodium: 128 mmol/L — ABNORMAL LOW (ref 135–145)

## 2020-08-02 LAB — PHOSPHORUS: Phosphorus: 3.4 mg/dL (ref 2.5–4.6)

## 2020-08-02 LAB — HEMOGLOBIN: Hemoglobin: 9.5 g/dL — ABNORMAL LOW (ref 12.0–15.0)

## 2020-08-02 LAB — PREPARE RBC (CROSSMATCH)

## 2020-08-02 MED ORDER — SODIUM CHLORIDE 0.9% IV SOLUTION: Freq: Once | INTRAVENOUS | Status: AC

## 2020-08-02 MED ORDER — VITAMIN D 25 MCG (1000 UNIT) PO TABS
1000.0000 [IU] | ORAL_TABLET | Freq: Every day | ORAL | Status: DC
  Administered 2020-08-03 – 2020-08-06 (×4): 1000 [IU] via ORAL
  Filled 2020-08-02 (×5): qty 1

## 2020-08-02 MED ORDER — DOCUSATE SODIUM 50 MG/5ML PO LIQD
100.0000 mg | Freq: Two times a day (BID) | ORAL | Status: DC
  Administered 2020-08-02 – 2020-08-05 (×3): 100 mg via ORAL
  Filled 2020-08-02 (×10): qty 10

## 2020-08-02 MED ORDER — SODIUM CHLORIDE 1 G PO TABS
1.0000 g | ORAL_TABLET | Freq: Three times a day (TID) | ORAL | Status: DC
  Administered 2020-08-02: 1 g via ORAL
  Filled 2020-08-02 (×3): qty 1

## 2020-08-02 NOTE — Progress Notes (Signed)
PROGRESS NOTE  Kristen Osborn LPF:[id number] DOB: September 03, 1926 DOA: 07/31/2020 PCP: Danella Penton, MD  HPI/Recap of past 24 hours:  Kristen Osborn is a 87 y.o. female with medical history significant for primary hypertension, peripheral artery disease, atrial fibrillation on Eliquis, diastolic heart failure, Alzheimer dementia, status post permanent pacemaker, presents to the emergency department for chief concerns of a witnessed fall after tripping.  Patient was out having lunch with her daughter.  At baseline she ambulates with a walker.  Her daughter had helped her get into the car and went to the other side to get into the car herself.  Patient then opens the door and attempts to ambulate on her own stating to her daughter that she needed to use the restroom.  She then tripped and fell at the threshold of the sidewalk elevation.  Negative for head trauma with weakness or loss of consciousness.  Since the fall, patient endorses 10 out of 10 pain of the right hip.  Work-up revealed periprosthetic fracture of the right femur.  Orthopedic surgery consulted, surgical intervention delayed due to recent use of DOAC, Eliquis, which is on hold.  Bladder scan showing urinary retention with greater than 300 cc of urine retained on 08/01/20, Foley catheter inserted.  08/02/20: Acute drop in hemoglobin, down to 6.5, will transfuse 2 unit PRBC.  Obtain consent from daughter at bedside and from another daughter via phone..   Assessment/Plan: Principal Problem:   Closed right hip fracture (HCC) Active Problems:   Alzheimer's dementia without behavioral disturbance (HCC)   Atrial fibrillation and flutter (HCC)   Benign essential HTN   PAD (peripheral artery disease) (HCC)   Status post placement of cardiac pacemaker   Chronic anticoagulation   Closed displaced fracture of right femoral neck (HCC)   Protein-calorie malnutrition, severe   Closed right hip fracture, initial encounter  (HCC)  Closed right hip fracture post mechanical fall Oblique displaced perihardware fracture about the femoral component of the right hip total arthroplasty seen on x-ray.\ Orthopedic surgery consulted, recommended to continue to hold off Eliquis, possible surgical intervention in a few days. Continue pain control Management per orthopedic surgery. Plan for possible orthopedic surgery's intervention on 08/03/2020.   N.p.o. after midnight.  Acute blood loss anemia Drop in hemoglobin this morning down to 6.5 from 8.8 Will transfuse 2 unit PRBCs.  Obtained consent from family. Possible orthopedic surgery tomorrow 07/26/2020.  Vitamin D3 deficiency Vitamin D3 level and indicated will deficiency, 26. Start vitamin D3 replacement.  Chronic hyponatremia on salt tablets prior to admission Serum sodium 128 from 127, she is currently asymptomatic. Continue home salt tablets Monitor sodium level  AKI, likely prerenal in the setting of dehydration, poor oral intake Baseline creatinine appears to be 0.5 with GFR greater than 60 Presented with creatinine of 0.99 with GFR 54. Start gentle IV fluid hydration normal saline at 50 cc/h x 2 days. Encourage oral intake.  Acute urinary retention Unable to void this morning Greater than 300 cc urine retained from bladder scan Monitor urine output We will do a voiding trial after her surgery.  Hyperglycemia with no known history of diabetes Presented withSerum glucose 187 Will obtain hemoglobin A1c We will start insulin sliding scale as needed  Chronic normocytic anemia Drop in hemoglobin this morning down to 8.8 from 10.6 which appears to be her baseline.\ Closely monitor H&H Maintain hemoglobin greater than 8.0 due to planned surgical procedure.  Physical debility PT OT to assess post surgery with guidance  of orthopedic surgery. Continue fall precautions.  Per her daughter she was recently treated for urinary tract infection at outside  facility Will obtain a UA and urine culture Home oral antibiotics on hold She was started on Rocephin empirically Follow urine analysis and urine culture. Urine culture in process.    Code Status: DNR  Family Communication: Daughter at bedside  Disposition Plan: Home with home health services versus SNF.   Consultants:  Orthopedic surgery  Procedures:  None.  Antimicrobials:  Rocephin  DVT prophylaxis: SCDs.  Status is: Inpatient.  Patient will likely require at least 2 midnight for further evaluation and treatment of present condition.  Dispo:  Patient From: Home  Planned Disposition: Home with home health services versus SNF.  Medically stable for discharge: No, management of right hip fracture.       Objective: Vitals:   08/02/20 1146 08/02/20 1345 08/02/20 1415 08/02/20 1435  BP: 123/61 128/63 (!) 142/85 (!) 147/78  Pulse: 93 92 83 64  Resp: 16 18 18 18   Temp: 97.6 F (36.4 C) 97.8 F (36.6 C) 99 F (37.2 C) 98.1 F (36.7 C)  TempSrc: Oral Oral Axillary Axillary  SpO2: 99% 99% 98% 99%  Weight:      Height:        Intake/Output Summary (Last 24 hours) at 08/02/2020 1526 Last data filed at 08/02/2020 1345 Gross per 24 hour  Intake 984.25 ml  Output 1250 ml  Net -265.75 ml   Filed Weights   07/31/20 1903 08/01/20 1043  Weight: 48.6 kg 47 kg    Exam:   General: 87 y.o. year-old female frail-appearing in no acute distress.  She is alert and interactive at the time of this visit.    Cardiovascular: Regular rate and rhythm no rubs or gallops.    Respiratory: Clear to auscultation no wheeze or rales.    Abdomen: Soft nontender normal bowel sounds present.    Musculoskeletal: Right lower extremity edema, right knee edema.  Skin: No ulcerations lesions noted.    Psychiatry: Mood is appropriate for condition and setting.   Data Reviewed: CBC: Recent Labs  Lab 07/31/20 1450 08/01/20 0811 08/02/20 0616  WBC 9.6 10.0 8.9  NEUTROABS  6.1  --   --   HGB 10.6* 8.8* 6.5*  HCT 33.0* 26.1* 20.0*  MCV 90.9 88.5 89.7  PLT 239 194 138*   Basic Metabolic Panel: Recent Labs  Lab 07/31/20 1450 08/01/20 0747 08/02/20 0616  NA 129* 127* 128*  K 4.6 4.7 4.0  CL 96* 94* 96*  CO2 23 25 24   GLUCOSE 187* 130* 140*  BUN 27* 32* 28*  CREATININE 0.99 0.84 0.83  CALCIUM 8.7* 8.9 8.2*  MG  --  1.8 1.9  PHOS  --   --  3.4   GFR: Estimated Creatinine Clearance: 32.8 mL/min (by C-G formula based on SCr of 0.83 mg/dL). Liver Function Tests: No results for input(s): AST, ALT, ALKPHOS, BILITOT, PROT, ALBUMIN in the last 168 hours. No results for input(s): LIPASE, AMYLASE in the last 168 hours. No results for input(s): AMMONIA in the last 168 hours. Coagulation Profile: No results for input(s): INR, PROTIME in the last 168 hours. Cardiac Enzymes: Recent Labs  Lab 07/31/20 1450 08/01/20 0747  CKTOTAL 106 86   BNP (last 3 results) No results for input(s): PROBNP in the last 8760 hours. HbA1C: No results for input(s): HGBA1C in the last 72 hours. CBG: No results for input(s): GLUCAP in the last 168 hours. Lipid Profile:  No results for input(s): CHOL, HDL, LDLCALC, TRIG, CHOLHDL, LDLDIRECT in the last 72 hours. Thyroid Function Tests: No results for input(s): TSH, T4TOTAL, FREET4, T3FREE, THYROIDAB in the last 72 hours. Anemia Panel: No results for input(s): VITAMINB12, FOLATE, FERRITIN, TIBC, IRON, RETICCTPCT in the last 72 hours. Urine analysis:    Component Value Date/Time   COLORURINE YELLOW (A) 08/01/2020 1215   APPEARANCEUR HAZY (A) 08/01/2020 1215   APPEARANCEUR Hazy 08/05/2011 1551   LABSPEC 1.021 08/01/2020 1215   LABSPEC 1.014 08/05/2011 1551   PHURINE 5.0 08/01/2020 1215   GLUCOSEU NEGATIVE 08/01/2020 1215   GLUCOSEU Negative 08/05/2011 1551   HGBUR NEGATIVE 08/01/2020 1215   BILIRUBINUR NEGATIVE 08/01/2020 1215   BILIRUBINUR Negative 08/05/2011 1551   KETONESUR NEGATIVE 08/01/2020 1215   PROTEINUR 30  (A) 08/01/2020 1215   NITRITE NEGATIVE 08/01/2020 1215   LEUKOCYTESUR NEGATIVE 08/01/2020 1215   LEUKOCYTESUR 3+ 08/05/2011 1551   Sepsis Labs: @LABRCNTIP (procalcitonin:4,lacticidven:4)  ) Recent Results (from the past 240 hour(s))  Resp Panel by RT-PCR (Flu A&B, Covid) Nasopharyngeal Swab     Status: None   Collection Time: 07/31/20  4:20 PM   Specimen: Nasopharyngeal Swab; Nasopharyngeal(NP) swabs in vial transport medium  Result Value Ref Range Status   SARS Coronavirus 2 by RT PCR NEGATIVE NEGATIVE Final    Comment: (NOTE) SARS-CoV-2 target nucleic acids are NOT DETECTED.  The SARS-CoV-2 RNA is generally detectable in upper respiratory specimens during the acute phase of infection. The lowest concentration of SARS-CoV-2 viral copies this assay can detect is 138 copies/mL. A negative result does not preclude SARS-Cov-2 infection and should not be used as the sole basis for treatment or other patient management decisions. A negative result may occur with  improper specimen collection/handling, submission of specimen other than nasopharyngeal swab, presence of viral mutation(s) within the areas targeted by this assay, and inadequate number of viral copies(<138 copies/mL). A negative result must be combined with clinical observations, patient history, and epidemiological information. The expected result is Negative.  Fact Sheet for Patients:  08/02/20  Fact Sheet for Healthcare Providers:  BloggerCourse.com  This test is no t yet approved or cleared by the [employer] FDA and  has been authorized for detection and/or diagnosis of SARS-CoV-2 by FDA under an Emergency Use Authorization (EUA). This EUA will remain  in effect (meaning this test can be used) for the duration of the COVID-19 declaration under Section 564(b)(1) of the Act, 21 U.S.C.section 360bbb-3(b)(1), unless the authorization is terminated  or revoked  sooner.       Influenza A by PCR NEGATIVE NEGATIVE Final   Influenza B by PCR NEGATIVE NEGATIVE Final    Comment: (NOTE) The Xpert Xpress SARS-CoV-2/FLU/RSV plus assay is intended as an aid in the diagnosis of influenza from Nasopharyngeal swab specimens and should not be used as a sole basis for treatment. Nasal washings and aspirates are unacceptable for Xpert Xpress SARS-CoV-2/FLU/RSV testing.  Fact Sheet for Patients: [place]  Fact Sheet for Healthcare Providers: BloggerCourse.com  This test is not yet approved or cleared by the [employer] FDA and has been authorized for detection and/or diagnosis of SARS-CoV-2 by FDA under an Emergency Use Authorization (EUA). This EUA will remain in effect (meaning this test can be used) for the duration of the COVID-19 declaration under Section 564(b)(1) of the Act, 21 U.S.C. section 360bbb-3(b)(1), unless the authorization is terminated or revoked.  Performed at [facility], [address] [place], [place]  Studies: No results found.  Scheduled Meds:  Chlorhexidine Gluconate Cloth  6 each Topical Daily   docusate sodium  100 mg Oral BID   feeding supplement  237 mL Oral TID BM   ferrous sulfate  325 mg Oral Q breakfast   losartan  25 mg Oral Daily   metoprolol tartrate  25 mg Oral Q6H   multivitamin with minerals  1 tablet Oral Daily   pantoprazole  40 mg Oral Daily   QUEtiapine  25 mg Oral QHS   sodium chloride  1 g Oral Daily   vitamin B-12  1,000 mcg Oral Daily    Continuous Infusions:  sodium chloride 50 mL/hr at 08/02/20 1021   cefTRIAXone (ROCEPHIN)  IV 1 g (08/01/20 1724)     LOS: 1 day     Darlin Droparole N Kieth Hartis, MD [facility] Pager [phone]  If 7PM-7AM, please contact night-coverage [email or web address] Password [email or web address] 08/02/2020, 3:26 PM

## 2020-08-02 NOTE — Progress Notes (Signed)
Subjective:  Patient sleeping.  Her daughter is at the bedside.  Objective:   VITALS:   Vitals:   08/02/20 1146 08/02/20 1345 08/02/20 1415 08/02/20 1435  BP: 123/61 128/63 (!) 142/85 (!) 147/78  Pulse: 93 92 83 64  Resp: 16 18 18 18   Temp: 97.6 F (36.4 C) 97.8 F (36.6 C) 99 F (37.2 C) 98.1 F (36.7 C)  TempSrc: Oral Oral Axillary Axillary  SpO2: 99% 99% 98% 99%  Weight:      Height:        PHYSICAL EXAM: Deferred as patient is sleeping.     LABS  Results for orders placed or performed during the hospital encounter of 07/31/20 (from the past 24 hour(s))  CBC     Status: Abnormal   Collection Time: 08/02/20  6:16 AM  Result Value Ref Range   WBC 8.9 4.0 - 10.5 K/uL   RBC 2.23 (L) 3.87 - 5.11 MIL/uL   Hemoglobin 6.5 (L) 12.0 - 15.0 g/dL   HCT 08/04/20 (L) 67.6 - 19.5 %   MCV 89.7 80.0 - 100.0 fL   MCH 29.1 26.0 - 34.0 pg   MCHC 32.5 30.0 - 36.0 g/dL   RDW 09.3 26.7 - 12.4 %   Platelets 138 (L) 150 - 400 K/uL   nRBC 0.0 0.0 - 0.2 %  Basic metabolic panel     Status: Abnormal   Collection Time: 08/02/20  6:16 AM  Result Value Ref Range   Sodium 128 (L) 135 - 145 mmol/L   Potassium 4.0 3.5 - 5.1 mmol/L   Chloride 96 (L) 98 - 111 mmol/L   CO2 24 22 - 32 mmol/L   Glucose, Bld 140 (H) 70 - 99 mg/dL   BUN 28 (H) 8 - 23 mg/dL   Creatinine, Ser 08/04/20 0.44 - 1.00 mg/dL   Calcium 8.2 (L) 8.9 - 10.3 mg/dL   GFR, Estimated 9.98 >33 mL/min   Anion gap 8 5 - 15  Magnesium     Status: None   Collection Time: 08/02/20  6:16 AM  Result Value Ref Range   Magnesium 1.9 1.7 - 2.4 mg/dL  Phosphorus     Status: None   Collection Time: 08/02/20  6:16 AM  Result Value Ref Range   Phosphorus 3.4 2.5 - 4.6 mg/dL  Type and screen [facility]     Status: None (Preliminary result)   Collection Time: 08/02/20  6:16 AM  Result Value Ref Range   ABO/RH(D) O POS    Antibody Screen NEG    Sample Expiration 08/05/2020,2359    Unit Number 08/07/2020    Blood  Component Type RED CELLS,LR    Unit division 00    Status of Unit ISSUED    Transfusion Status OK TO TRANSFUSE    Crossmatch Result Compatible    Unit Number [id number]    Blood Component Type RED CELLS,LR    Unit division 00    Status of Unit ISSUED    Transfusion Status OK TO TRANSFUSE    Crossmatch Result      Compatible Performed at [facility], [address] [place], [place]   Prepare RBC (crossmatch)     Status: None   Collection Time: 08/02/20  9:00 AM  Result Value Ref Range   Order Confirmation      ORDER PROCESSED BY BLOOD BANK Performed at [facility], [address] [place], [place]     DG Chest 1 View  Result Date: 07/31/2020 CLINICAL DATA:  Tripped and fell on right hip, recent right hip surgery EXAM: CHEST  1 VIEW COMPARISON:  06/24/2020 FINDINGS: 2 frontal views of the chest demonstrate stable single lead pacer. Cardiac silhouette is enlarged. No airspace disease, effusion, or pneumothorax. There are no acute displaced fractures. IMPRESSION: 1. No acute intrathoracic process. Electronically Signed   By: Sharlet Salina M.D.   On: 07/31/2020 15:41   DG Pelvis 1-2 Views  Result Date: 07/31/2020 CLINICAL DATA:  Tripped and fell, recent right hip surgery EXAM: PELVIS - 1-2 VIEW COMPARISON:  06/28/2020 FINDINGS: Single frontal view of the pelvis was performed, including both hips. Right hip hemiarthroplasty is partially identified. There is a periprosthetic fracture involving the femur, only partially included on this exam. Please refer to dedicated right femur series for complete characterization and visualization. The remainder of the bony pelvis is unremarkable. The left hip is well aligned, with mild osteoarthritis. IMPRESSION: 1. Partial visualization of a periprosthetic fracture of the right femur. Please refer to dedicated right femur evaluation. 2. Otherwise unremarkable bony pelvis. Electronically Signed   By: Sharlet Salina M.D.   On: 07/31/2020 15:46   DG FEMUR, MIN 2 VIEWS RIGHT  Result Date: 07/31/2020 CLINICAL DATA:  Fall over curb, right hip pain EXAM: RIGHT FEMUR 2 VIEWS COMPARISON:  06/28/2020 FINDINGS: There is an an oblique, displaced perihardware fracture about the femoral component of a right hip total arthroplasty. Osteopenia. No displaced fracture of the included pelvis. IMPRESSION: There is an oblique, displaced perihardware fracture about the femoral component of a right hip total arthroplasty. Electronically Signed   By: Lauralyn Primes M.D.   On: 07/31/2020 15:33    Assessment/Plan:     Principal Problem:   Closed right hip fracture (HCC) Active Problems:   Alzheimer's dementia without behavioral disturbance (HCC)   Atrial fibrillation and flutter (HCC)   Benign essential HTN   PAD (peripheral artery disease) (HCC)   Status post placement of cardiac pacemaker   Chronic anticoagulation   Closed displaced fracture of right femoral neck (HCC)   Protein-calorie malnutrition, severe   Closed right hip fracture, initial encounter [facility])   Patient has a right periprosthetic femur fracture.  Dr. Odis Luster performed her hemiarthroplasty surgery and is agreed to take care of the patient for her femur fracture.  He will see the patient tomorrow to discuss surgical options for treatment.  Patient is n.p.o. after midnight.  Her anticoagulation has been stopped in preparation for possible surgery tomorrow.   Juanell Fairly , MD 08/02/2020, 3:12 PM

## 2020-08-03 ENCOUNTER — Encounter
Admission: EM | Disposition: A | Discharge status: Home Health | Payer: Self-pay | Source: Home / Self Care | Attending: Internal Medicine

## 2020-08-03 ENCOUNTER — Inpatient Hospital Stay: Payer: Medicare Other

## 2020-08-03 ENCOUNTER — Inpatient Hospital Stay: Payer: Medicare Other | Admitting: Certified Registered"

## 2020-08-03 ENCOUNTER — Encounter: Payer: Self-pay | Admitting: Internal Medicine

## 2020-08-03 HISTORY — PX: ORIF PERIPROSTHETIC FRACTURE: SHX5034

## 2020-08-03 LAB — BASIC METABOLIC PANEL
Anion gap: 7 (ref 5–15)
Anion gap: 9 (ref 5–15)
BUN: 24 mg/dL — ABNORMAL HIGH (ref 8–23)
BUN: 28 mg/dL — ABNORMAL HIGH (ref 8–23)
CO2: 24 mmol/L (ref 22–32)
CO2: 25 mmol/L (ref 22–32)
Calcium: 8.1 mg/dL — ABNORMAL LOW (ref 8.9–10.3)
Calcium: 8.3 mg/dL — ABNORMAL LOW (ref 8.9–10.3)
Chloride: 98 mmol/L (ref 98–111)
Chloride: 99 mmol/L (ref 98–111)
Creatinine, Ser: 0.57 mg/dL (ref 0.44–1.00)
Creatinine, Ser: 0.61 mg/dL (ref 0.44–1.00)
GFR, Estimated: >60 mL/min (ref >60)
GFR, Estimated: >60 mL/min (ref >60)
Glucose, Bld: 123 mg/dL — ABNORMAL HIGH (ref 70–99)
Glucose, Bld: 152 mg/dL — ABNORMAL HIGH (ref 70–99)
Potassium: 3.8 mmol/L (ref 3.5–5.1)
Potassium: 3.9 mmol/L (ref 3.5–5.1)
Sodium: 130 mmol/L — ABNORMAL LOW (ref 135–145)
Sodium: 132 mmol/L — ABNORMAL LOW (ref 135–145)

## 2020-08-03 LAB — CBC
HCT: 28 % — ABNORMAL LOW (ref 36.0–46.0)
HCT: 28.6 % — ABNORMAL LOW (ref 36.0–46.0)
Hemoglobin: 9.3 g/dL — ABNORMAL LOW (ref 12.0–15.0)
Hemoglobin: 9.6 g/dL — ABNORMAL LOW (ref 12.0–15.0)
MCH: 29.2 pg (ref 26.0–34.0)
MCH: 29.4 pg (ref 26.0–34.0)
MCHC: 33.2 g/dL (ref 30.0–36.0)
MCHC: 33.6 g/dL (ref 30.0–36.0)
MCV: 87.7 fL (ref 80.0–100.0)
MCV: 87.8 fL (ref 80.0–100.0)
Platelets: 135 10*3/uL — ABNORMAL LOW (ref 150–400)
Platelets: 140 10*3/uL — ABNORMAL LOW (ref 150–400)
RBC: 3.19 MIL/uL — ABNORMAL LOW (ref 3.87–5.11)
RBC: 3.26 MIL/uL — ABNORMAL LOW (ref 3.87–5.11)
RDW: 14.9 % (ref 11.5–15.5)
RDW: 15.2 % (ref 11.5–15.5)
WBC: 10 10*3/uL (ref 4.0–10.5)
WBC: 9.6 10*3/uL (ref 4.0–10.5)
nRBC: 0 % (ref 0.0–0.2)
nRBC: 0 % (ref 0.0–0.2)

## 2020-08-03 LAB — URINE CULTURE: Culture: NO GROWTH

## 2020-08-03 LAB — PHOSPHORUS: Phosphorus: 3 mg/dL (ref 2.5–4.6)

## 2020-08-03 LAB — PROTIME-INR
INR: 1.2 (ref 0.8–1.2)
Prothrombin Time: 14.6 seconds (ref 11.4–15.2)

## 2020-08-03 LAB — MAGNESIUM: Magnesium: 1.8 mg/dL (ref 1.7–2.4)

## 2020-08-03 SURGERY — OPEN REDUCTION INTERNAL FIXATION (ORIF) PERIPROSTHETIC FRACTURE
Anesthesia: Spinal | Laterality: Right

## 2020-08-03 MED ORDER — PROPOFOL 10 MG/ML IV BOLUS
INTRAVENOUS | Status: DC | PRN
  Administered 2020-08-03 (×5): 20 mg via INTRAVENOUS

## 2020-08-03 MED ORDER — CEFAZOLIN SODIUM-DEXTROSE 1-4 GM/50ML-% IV SOLN
1.0000 g | Freq: Four times a day (QID) | INTRAVENOUS | Status: AC
  Administered 2020-08-04 (×3): 1 g via INTRAVENOUS
  Filled 2020-08-03 (×4): qty 50

## 2020-08-03 MED ORDER — OXYCODONE HCL 5 MG/5ML PO SOLN: 5.0000 mg | Freq: Once | ORAL | Status: DC | PRN

## 2020-08-03 MED ORDER — BUPIVACAINE-EPINEPHRINE (PF) 0.25% -1:200000 IJ SOLN
INTRAMUSCULAR | Status: DC | PRN
  Administered 2020-08-03: 30 mL via PERINEURAL

## 2020-08-03 MED ORDER — PHENYLEPHRINE HCL (PRESSORS) 10 MG/ML IV SOLN
INTRAVENOUS | Status: DC | PRN
  Administered 2020-08-03 (×3): 100 ug via INTRAVENOUS

## 2020-08-03 MED ORDER — PROPOFOL 500 MG/50ML IV EMUL
INTRAVENOUS | Status: DC | PRN
  Administered 2020-08-03: 40 ug/kg/min via INTRAVENOUS

## 2020-08-03 MED ORDER — FENTANYL CITRATE (PF) 100 MCG/2ML IJ SOLN: 25.0000 ug | INTRAMUSCULAR | Status: DC | PRN

## 2020-08-03 MED ORDER — PROPOFOL 10 MG/ML IV BOLUS
INTRAVENOUS | Status: AC
  Filled 2020-08-03: qty 20

## 2020-08-03 MED ORDER — PROPOFOL 500 MG/50ML IV EMUL
INTRAVENOUS | Status: AC
  Filled 2020-08-03: qty 50

## 2020-08-03 MED ORDER — HALOPERIDOL LACTATE 5 MG/ML IJ SOLN
2.5000 mg | Freq: Once | INTRAMUSCULAR | Status: AC
  Administered 2020-08-03: 2.5 mg via INTRAVENOUS
  Filled 2020-08-03: qty 1

## 2020-08-03 MED ORDER — SODIUM CHLORIDE 0.9 % IV SOLN: INTRAVENOUS | Status: DC | PRN

## 2020-08-03 MED ORDER — FENTANYL CITRATE (PF) 100 MCG/2ML IJ SOLN
INTRAMUSCULAR | Status: AC
  Filled 2020-08-03: qty 2

## 2020-08-03 MED ORDER — BUPIVACAINE HCL (PF) 0.5 % IJ SOLN
INTRAMUSCULAR | Status: DC | PRN
  Administered 2020-08-03: 2.8 mL via INTRATHECAL

## 2020-08-03 MED ORDER — SODIUM CHLORIDE 0.9 % IV SOLN
INTRAVENOUS | Status: DC | PRN
  Administered 2020-08-03: 50 ug/min via INTRAVENOUS

## 2020-08-03 MED ORDER — SODIUM CHLORIDE 1 G PO TABS
1.0000 g | ORAL_TABLET | Freq: Every day | ORAL | Status: DC
  Administered 2020-08-03 – 2020-08-06 (×4): 1 g via ORAL
  Filled 2020-08-03 (×4): qty 1

## 2020-08-03 MED ORDER — DEXMEDETOMIDINE (PRECEDEX) IN NS 20 MCG/5ML (4 MCG/ML) IV SYRINGE
PREFILLED_SYRINGE | INTRAVENOUS | Status: AC
  Filled 2020-08-03: qty 5

## 2020-08-03 MED ORDER — DEXMEDETOMIDINE (PRECEDEX) IN NS 20 MCG/5ML (4 MCG/ML) IV SYRINGE
PREFILLED_SYRINGE | INTRAVENOUS | Status: DC | PRN
  Administered 2020-08-03 (×2): 4 ug via INTRAVENOUS

## 2020-08-03 MED ORDER — NEOMYCIN-POLYMYXIN B GU 40-200000 IR SOLN
Status: DC | PRN
  Administered 2020-08-03: 12 mL

## 2020-08-03 MED ORDER — OXYCODONE HCL 5 MG PO TABS
5.0000 mg | ORAL_TABLET | Freq: Once | ORAL | Status: DC | PRN
Start: 2020-08-03 — End: 2020-08-03

## 2020-08-03 MED ORDER — ONDANSETRON HCL 4 MG/2ML IJ SOLN: 4.0000 mg | Freq: Once | INTRAMUSCULAR | Status: DC | PRN

## 2020-08-03 SURGICAL SUPPLY — 50 items
APL PRP STRL LF DISP 70% ISPRP (MISCELLANEOUS) ×1
BIT DRILL CANN QC 4.3X180 (BIT) IMPLANT
BIT DRILL Q/COUPLING 1 (BIT) ×1 IMPLANT
BNDG COHESIVE 4X5 TAN STRL (GAUZE/BANDAGES/DRESSINGS) IMPLANT
BRUSH SCRUB EZ  4% CHG (MISCELLANEOUS) ×2
BRUSH SCRUB EZ 4% CHG (MISCELLANEOUS) ×2 IMPLANT
CABLE 1.7 (Orthopedic Implant) ×5 IMPLANT
CHLORAPREP W/TINT 26 (MISCELLANEOUS) ×2 IMPLANT
COVER WAND RF STERILE (DRAPES) ×2 IMPLANT
DRAPE 3/4 80X56 (DRAPES) ×2 IMPLANT
DRAPE U-SHAPE 47X51 STRL (DRAPES) ×2 IMPLANT
DRILL BIT 4.3MM (BIT) ×2
DRSG AQUACEL AG ADV 3.5X 4 (GAUZE/BANDAGES/DRESSINGS) IMPLANT
DRSG AQUACEL AG ADV 3.5X10 (GAUZE/BANDAGES/DRESSINGS) ×2 IMPLANT
ELECT REM PT RETURN 9FT ADLT (ELECTROSURGICAL) ×2
ELECTRODE REM PT RTRN 9FT ADLT (ELECTROSURGICAL) ×1 IMPLANT
GAUZE XEROFORM 1X8 LF (GAUZE/BANDAGES/DRESSINGS) ×4 IMPLANT
GLOVE SURG ORTHO LTX SZ8 (GLOVE) ×2 IMPLANT
GLOVE SURG UNDER LTX SZ8 (GLOVE) ×2 IMPLANT
GOWN STRL REUS W/ TWL LRG LVL3 (GOWN DISPOSABLE) ×1 IMPLANT
GOWN STRL REUS W/ TWL XL LVL3 (GOWN DISPOSABLE) ×1 IMPLANT
GOWN STRL REUS W/TWL LRG LVL3 (GOWN DISPOSABLE) ×2
GOWN STRL REUS W/TWL XL LVL3 (GOWN DISPOSABLE) ×2
IMMOB KNEE 24 THIGH 24 443303 (SOFTGOODS) ×1 IMPLANT
KIT PATIENT CARE HANA TABLE (KITS) ×2 IMPLANT
KIT TURNOVER CYSTO (KITS) ×2 IMPLANT
MANIFOLD NEPTUNE II (INSTRUMENTS) ×2 IMPLANT
MAT ABSORB  FLUID 56X50 GRAY (MISCELLANEOUS) ×1
MAT ABSORB FLUID 56X50 GRAY (MISCELLANEOUS) ×1 IMPLANT
NDL SPNL 20GX3.5 QUINCKE YW (NEEDLE) ×1 IMPLANT
NEEDLE SPNL 20GX3.5 QUINCKE YW (NEEDLE) ×2 IMPLANT
NS IRRIG 1000ML POUR BTL (IV SOLUTION) ×2 IMPLANT
PACK HIP COMPR (MISCELLANEOUS) ×2 IMPLANT
PIN POSITION 4.5 THRED (Pin) ×4 IMPLANT
PLATE CRV LCP 17 HOLE 4.5X318 (Plate) ×1 IMPLANT
PULSAVAC PLUS IRRIG FAN TIP (DISPOSABLE) ×2
SCREW CORTEX ST 4.5X32 (Screw) ×1 IMPLANT
SCREW CORTEX ST 4.5X36 (Screw) ×2 IMPLANT
SCREW LOCK ST T25 SD 5.0X38 (Screw) ×3 IMPLANT
SCREW LOCKING 5.0MM 42MM (Screw) ×1 IMPLANT
STAPLER SKIN PROX 35W (STAPLE) ×2 IMPLANT
SUT DVC 2 QUILL PDO  T11 36X36 (SUTURE) ×1
SUT DVC 2 QUILL PDO T11 36X36 (SUTURE) IMPLANT
SUT QUILL PDO 2-0 40CM 26MM (SUTURE) ×1 IMPLANT
SUT VIC AB 0 CT1 36 (SUTURE) ×2 IMPLANT
SUT VIC AB 2-0 CT1 27 (SUTURE) ×4
SUT VIC AB 2-0 CT1 TAPERPNT 27 (SUTURE) ×2 IMPLANT
SYR 30ML LL (SYRINGE) ×2 IMPLANT
TIP FAN IRRIG PULSAVAC PLUS (DISPOSABLE) IMPLANT
TOWEL OR 17X26 4PK STRL BLUE (TOWEL DISPOSABLE) ×2 IMPLANT

## 2020-08-03 NOTE — H&P (Signed)
The patient has been re-examined, and the chart reviewed, and there have been no interval changes to the documented history and physical.  Plan a right femur open reduction and internal fixation of periprosthetic right hip fracture today.  Anesthesia is not consulted regarding a peripheral nerve block for post-operative pain.  The risks, benefits, and alternatives have been discussed at length, and the patient is willing to proceed.    The diagnosis, risks, benefits and alternatives to treatment are all discussed in detail with the patient and family. Risks include but are not limited to bleeding, infection, deep vein thrombosis, pulmonary embolism, nerve or vascular injury, non-union, repeat operation, persistent pain, weakness, stiffness and death. Her family understands and is eager to proceed.

## 2020-08-03 NOTE — Anesthesia Preprocedure Evaluation (Signed)
Anesthesia Evaluation  Patient identified by MRN, date of birth, ID band Patient awake  General Assessment Comment:Last eliquis Friday AM (over 72 hours ago)  Reviewed: Allergy & Precautions, H&P , NPO status , Patient's Chart, lab work & pertinent test results, reviewed documented beta blocker date and time   History of Anesthesia Complications Negative for: history of anesthetic complications  Airway Mallampati: II  TM Distance: >3 FB Neck ROM: full    Dental  (+) Poor Dentition   Pulmonary neg pulmonary ROS,    Pulmonary exam normal        Cardiovascular Exercise Tolerance: Poor hypertension, On Medications + Peripheral Vascular Disease and +CHF  + dysrhythmias Atrial Fibrillation + pacemaker  Rhythm:Irregular Rate:Normal     Neuro/Psych PSYCHIATRIC DISORDERS Dementia negative neurological ROS  negative psych ROS   GI/Hepatic negative GI ROS, Neg liver ROS,   Endo/Other  negative endocrine ROS  Renal/GU negative Renal ROS  negative genitourinary   Musculoskeletal   Abdominal   Peds  Hematology negative hematology ROS (+)   Anesthesia Other Findings Past Medical History: No date: Atrial fibrillation (HCC) No date: CHF (congestive heart failure) (HCC) No date: Dementia (HCC)  Past Surgical History: No date: ABDOMINAL HYSTERECTOMY  BMI    Body Mass Index: 15.87 kg/m      Reproductive/Obstetrics negative OB ROS                            Anesthesia Physical  Anesthesia Plan  ASA: III  Anesthesia Plan: Spinal   Post-op Pain Management:    Induction: Intravenous  PONV Risk Score and Plan: 2 and Treatment may vary due to age or medical condition, TIVA, Ondansetron and Dexamethasone  Airway Management Planned: Nasal Cannula and Natural Airway  Additional Equipment: None  Intra-op Plan:   Post-operative Plan:   Informed Consent: I have reviewed the patients History  and Physical, chart, labs and discussed the procedure including the risks, benefits and alternatives for the proposed anesthesia with the patient or authorized representative who has indicated his/her understanding and acceptance.   Patient has DNR.  Discussed DNR with power of attorney and Suspend DNR.   Dental Advisory Given, History available from chart only and Consent reviewed with POA  Plan Discussed with: CRNA  Anesthesia Plan Comments: (Patient did well with spinal anesthetic last month for hip surgery. Discussed R/B/A of neuraxial anesthesia technique with patient and her two daughters at bedside: - rare risks of spinal/epidural hematoma, nerve damage, infection - Risk of PDPH - Risk of nausea and vomiting - Risk of conversion to general anesthesia and its associated risks, including sore throat, damage to lips/teeth/oropharynx, and rare risks such as cardiac and respiratory events.  Daughters voiced understanding.)       Anesthesia Quick Evaluation

## 2020-08-03 NOTE — Progress Notes (Addendum)
PROGRESS NOTE  Kristen Osborn HLK:[id number] DOB: Nov 06, 1926 DOA: 07/31/2020 PCP: Danella Penton, MD  HPI/Recap of past 24 hours:  Kristen Osborn is a 87 y.o. female with medical history significant for primary hypertension, peripheral artery disease, atrial fibrillation on Eliquis, diastolic heart failure, Alzheimer dementia, status post permanent pacemaker, presents to the emergency department from home due to witnessed mechanical fall.    Work-up revealed periprosthetic fracture of the right femur.  Orthopedic surgery consulted, surgical intervention delayed due to recent use of DOAC, Eliquis, which is on hold.  Hospital course complicated by acute urinary retention for which a Foley catheter was placed on 08/01/2020.  Will attempt a voiding trial a day after her surgery.  Also complicated by acute blood loss with hemoglobin dropped down to 6.5, she is currently post 2 unit PRBC transfusion.  08/03/20: Patient was seen and examined with her daughter at her bedside.  She denies having any pain at the time of this visit after taking her oral opiates.   Assessment/Plan: Principal Problem:   Closed right hip fracture (HCC) Active Problems:   Alzheimer's dementia without behavioral disturbance (HCC)   Atrial fibrillation and flutter (HCC)   Benign essential HTN   PAD (peripheral artery disease) (HCC)   Status post placement of cardiac pacemaker   Chronic anticoagulation   Closed displaced fracture of right femoral neck (HCC)   Protein-calorie malnutrition, severe   Closed right hip fracture, initial encounter (HCC)  Closed right hip fracture post mechanical fall Oblique displaced perihardware fracture about the femoral component of the right hip total arthroplasty seen on x-ray. Seen by orthopedic surgery, possible repair on 08/03/2020. Continue analgesics. Management per orthopedic surgery.  Permanent A. Fib, on Eliquis PTA, on hold due to planned surgery. She has had episodes  of A. fib with RVR with rates in the 120-140s. Continue to hold off Eliquis as recommended by orthopedic surgery. She is rate controlled on p.o. Lopressor 25 mg every 6 hours Continue to closely monitor on telemetry. IV Lopressor as needed with parameters.  Acute blood loss anemia post 2 unit PRBC transfusion. Drop in hemoglobin morning of 08/02/2020 down to 6.5 from 8.8 Transfused 2 unit PRBCs.    Vitamin D3 deficiency Vitamin D3 level and indicated will deficiency, 26. Continue vitamin D3 replacement.  Improved chronic hyponatremia on salt tablets prior to admission Serum sodium 130 from 132 from 128 from 127 She is currently asymptomatic. Continue home salt tablets Monitor sodium level  Resolving AKI, likely prerenal in the setting of dehydration, poor oral intake Baseline creatinine appears to be 0.5 with GFR greater than 60 Presented with creatinine of 0.99 with GFR 54. Creatinine is downtrending 0.6 She has received gentle IV fluid hydration normal saline at 50 cc/h x 2 days.  Acute urinary retention Unable to void morning of 08/02/2020. Greater than 300 cc urine retained from bladder scan We will attempt voiding trial after surgery likely on 08/04/2020. If she fails voiding trial will replace Foley catheter and refer to urology for voiding trial in the clinic.    Hyperglycemia with no known history of diabetes Presented withSerum glucose 187 Will obtain hemoglobin A1c We will start insulin sliding scale as needed  Chronic normocytic anemia Drop in hemoglobin this morning down to 8.8 from 10.6 which appears to be her baseline. Closely monitor H&H Maintain hemoglobin greater than 8.0 due to planned surgical procedure.  Physical debility PT OT to assess post surgery with guidance of orthopedic surgery. Continue fall  precautions.  Per her daughter she was recently treated for urinary tract infection at outside facility Will obtain a UA and urine culture Home oral  antibiotics on hold She was started on Rocephin empirically Urine culture negative to date Will DC antibiotics after removal of Foley catheter.    Code Status: DNR  Family Communication: Daughter at bedside  Disposition Plan: Home with home health services which is family's preference   Consultants:  Orthopedic surgery  Procedures:  None.  Antimicrobials:  Rocephin  DVT prophylaxis: SCDs.  Status is: Inpatient.  Patient will likely require at least 2 midnight for further evaluation and treatment of present condition.  Dispo:  Patient From: Home  Planned Disposition: Home with [facility] with home health services  Medically stable for discharge: No, management of right hip fracture.       Objective: Vitals:   08/03/20 0432 08/03/20 0745 08/03/20 0900 08/03/20 1209  BP: (!) 157/82 (!) 152/94  135/63  Pulse: 89 95  88  Resp: 18 18  16   Temp: 98.2 F (36.8 C) 98.6 F (37 C)  97.7 F (36.5 C)  TempSrc: Oral Oral  Oral  SpO2: 95% 95%  93%  Weight:   48.4 kg   Height:   5\' 6"  (1.676 m)     Intake/Output Summary (Last 24 hours) at 08/03/2020 1422 Last data filed at 08/02/2020 2300 Gross per 24 hour  Intake 1602.07 ml  Output 600 ml  Net 1002.07 ml   Filed Weights   07/31/20 1903 08/01/20 1043 08/03/20 0900  Weight: 48.6 kg 47 kg 48.4 kg    Exam:  . General: 87 y.o. year-old female frail-appearing no acute distress.  She is alert and interactive. . Cardiovascular: Tachycardic with no rubs or gallops.   08/05/20 Respiratory: Clear to auscultation no wheezes or rales.  She has poor inspiratory effort. . Abdomen: Soft nontender normal bowel sounds present . Musculoskeletal: Right lower extremity edema, right knee edema.   . Skin: No ulcerative lesions noted. 82 Psychiatry: Mood is appropriate for condition and setting.   Data Reviewed: CBC: Recent Labs  Lab 07/31/20 1450 08/01/20 0811 08/02/20 0616 08/02/20 1944 08/02/20 2330 08/03/20 0701  WBC  9.6 10.0 8.9  --  9.6 10.0  NEUTROABS 6.1  --   --   --   --   --   HGB 10.6* 8.8* 6.5* 9.5* 9.6* 9.3*  HCT 33.0* 26.1* 20.0*  --  28.6* 28.0*  MCV 90.9 88.5 89.7  --  87.7 87.8  PLT 239 194 138*  --  140* 135*   Basic Metabolic Panel: Recent Labs  Lab 07/31/20 1450 08/01/20 0747 08/02/20 0616 08/02/20 2330 08/03/20 0701  NA 129* 127* 128* 132* 130*  K 4.6 4.7 4.0 3.9 3.8  CL 96* 94* 96* 99 98  CO2 23 25 24 24 25   GLUCOSE 187* 130* 140* 152* 123*  BUN 27* 32* 28* 28* 24*  CREATININE 0.99 0.84 0.83 0.57 0.61  CALCIUM 8.7* 8.9 8.2* 8.1* 8.3*  MG  --  1.8 1.9 1.8  --   PHOS  --   --  3.4 3.0  --    GFR: Estimated Creatinine Clearance: 35 mL/min (by C-G formula based on SCr of 0.61 mg/dL). Liver Function Tests: No results for input(s): AST, ALT, ALKPHOS, BILITOT, PROT, ALBUMIN in the last 168 hours. No results for input(s): LIPASE, AMYLASE in the last 168 hours. No results for input(s): AMMONIA in the last 168 hours. Coagulation Profile: Recent  Labs  Lab 08/03/20 0701  INR 1.2   Cardiac Enzymes: Recent Labs  Lab 07/31/20 1450 08/01/20 0747  CKTOTAL 106 86   BNP (last 3 results) No results for input(s): PROBNP in the last 8760 hours. HbA1C: No results for input(s): HGBA1C in the last 72 hours. CBG: No results for input(s): GLUCAP in the last 168 hours. Lipid Profile: No results for input(s): CHOL, HDL, LDLCALC, TRIG, CHOLHDL, LDLDIRECT in the last 72 hours. Thyroid Function Tests: No results for input(s): TSH, T4TOTAL, FREET4, T3FREE, THYROIDAB in the last 72 hours. Anemia Panel: No results for input(s): VITAMINB12, FOLATE, FERRITIN, TIBC, IRON, RETICCTPCT in the last 72 hours. Urine analysis:    Component Value Date/Time   COLORURINE YELLOW (A) 08/01/2020 1215   APPEARANCEUR HAZY (A) 08/01/2020 1215   APPEARANCEUR Hazy 08/05/2011 1551   LABSPEC 1.021 08/01/2020 1215   LABSPEC 1.014 08/05/2011 1551   PHURINE 5.0 08/01/2020 1215   GLUCOSEU NEGATIVE  08/01/2020 1215   GLUCOSEU Negative 08/05/2011 1551   HGBUR NEGATIVE 08/01/2020 1215   BILIRUBINUR NEGATIVE 08/01/2020 1215   BILIRUBINUR Negative 08/05/2011 1551   KETONESUR NEGATIVE 08/01/2020 1215   PROTEINUR 30 (A) 08/01/2020 1215   NITRITE NEGATIVE 08/01/2020 1215   LEUKOCYTESUR NEGATIVE 08/01/2020 1215   LEUKOCYTESUR 3+ 08/05/2011 1551   Sepsis Labs: @LABRCNTIP (procalcitonin:4,lacticidven:4)  ) Recent Results (from the past 240 hour(s))  Resp Panel by RT-PCR (Flu A&B, Covid) Nasopharyngeal Swab     Status: None   Collection Time: 07/31/20  4:20 PM   Specimen: Nasopharyngeal Swab; Nasopharyngeal(NP) swabs in vial transport medium  Result Value Ref Range Status   SARS Coronavirus 2 by RT PCR NEGATIVE NEGATIVE Final    Comment: (NOTE) SARS-CoV-2 target nucleic acids are NOT DETECTED.  The SARS-CoV-2 RNA is generally detectable in upper respiratory specimens during the acute phase of infection. The lowest concentration of SARS-CoV-2 viral copies this assay can detect is 138 copies/mL. A negative result does not preclude SARS-Cov-2 infection and should not be used as the sole basis for treatment or other patient management decisions. A negative result may occur with  improper specimen collection/handling, submission of specimen other than nasopharyngeal swab, presence of viral mutation(s) within the areas targeted by this assay, and inadequate number of viral copies(<138 copies/mL). A negative result must be combined with clinical observations, patient history, and epidemiological information. The expected result is Negative.  Fact Sheet for Patients:  08/02/20  Fact Sheet for Healthcare Providers:  BloggerCourse.com  This test is no t yet approved or cleared by the [employer] FDA and  has been authorized for detection and/or diagnosis of SARS-CoV-2 by FDA under an Emergency Use Authorization (EUA). This EUA  will remain  in effect (meaning this test can be used) for the duration of the COVID-19 declaration under Section 564(b)(1) of the Act, 21 U.S.C.section 360bbb-3(b)(1), unless the authorization is terminated  or revoked sooner.       Influenza A by PCR NEGATIVE NEGATIVE Final   Influenza B by PCR NEGATIVE NEGATIVE Final    Comment: (NOTE) The Xpert Xpress SARS-CoV-2/FLU/RSV plus assay is intended as an aid in the diagnosis of influenza from Nasopharyngeal swab specimens and should not be used as a sole basis for treatment. Nasal washings and aspirates are unacceptable for Xpert Xpress SARS-CoV-2/FLU/RSV testing.  Fact Sheet for Patients: [place]  Fact Sheet for Healthcare Providers: BloggerCourse.com  This test is not yet approved or cleared by the [employer] FDA and has been authorized for detection and/or diagnosis  of SARS-CoV-2 by FDA under an Emergency Use Authorization (EUA). This EUA will remain in effect (meaning this test can be used) for the duration of the COVID-19 declaration under Section 564(b)(1) of the Act, 21 U.S.C. section 360bbb-3(b)(1), unless the authorization is terminated or revoked.  Performed at [facility], [address] [place], [place] [address]   Urine Culture     Status: None   Collection Time: 08/01/20 12:15 PM   Specimen: Urine, Random  Result Value Ref Range Status   Specimen Description   Final    URINE, RANDOM Performed at [facility], [address] [place], [place] [address]    Special Requests   Final    NONE Performed at [facility], [address] [place], [place] [address]    Culture   Final    NO GROWTH Performed at [facility], [address] [place], [place] [address]    Report Status 08/03/2020 FINAL  Final      Studies: No results found.  Scheduled Meds: . Chlorhexidine Gluconate Cloth  6 each Topical Daily  .  cholecalciferol  1,000 Units Oral Daily  . docusate  100 mg Oral BID  . feeding supplement  237 mL Oral TID BM  . ferrous sulfate  325 mg Oral Q breakfast  . losartan  25 mg Oral Daily  . metoprolol tartrate  25 mg Oral Q6H  . multivitamin with minerals  1 tablet Oral Daily  . pantoprazole  40 mg Oral Daily  . QUEtiapine  25 mg Oral QHS  . sodium chloride  1 g Oral Daily  . vitamin B-12  1,000 mcg Oral Daily    Continuous Infusions: . cefTRIAXone (ROCEPHIN)  IV 1 g (08/02/20 2306)     LOS: 2 days     Darlin Droparole N Samarrah Tranchina, MD [facility] Pager [phone]  If 7PM-7AM, please contact night-coverage [email or web address] Password [email or web address] 08/03/2020, 2:22 PM

## 2020-08-03 NOTE — Progress Notes (Signed)
Pt agitated, not able to get temperature or sp02

## 2020-08-03 NOTE — Transfer of Care (Signed)
Immediate Anesthesia Transfer of Care Note  Patient: Kristen Osborn  Procedure(s) Performed: OPEN REDUCTION INTERNAL FIXATION (ORIF) PERIPROSTHETIC FRACTURE (Right )  Patient Location: PACU  Anesthesia Type:Spinal  Level of Consciousness: drowsy and patient cooperative  Airway & Oxygen Therapy: Patient Spontanous Breathing  Post-op Assessment: Report given to RN and Post -op Vital signs reviewed and stable  Post vital signs: Reviewed and stable  Last Vitals:  Vitals Value Taken Time  BP 138/83 08/03/20 2022  Temp 36.4 C 08/03/20 2022  Pulse 76 08/03/20 2025  Resp 16 08/03/20 2025  SpO2 97 % 08/03/20 2025  Vitals shown include unvalidated device data.  Last Pain:  Vitals:   08/03/20 1601  TempSrc:   PainSc: 0-No pain         Complications: No complications documented.

## 2020-08-03 NOTE — Anesthesia Procedure Notes (Signed)
Date/Time: 08/03/2020 4:50 PM Performed by: Junious Silk, CRNA Pre-anesthesia Checklist: Patient identified, Emergency Drugs available, Suction available, Patient being monitored and Timeout performed Oxygen Delivery Method: Simple face mask

## 2020-08-03 NOTE — Anesthesia Procedure Notes (Signed)
Spinal  Patient location during procedure: OR Start time: 08/03/2020 4:42 PM End time: 08/03/2020 4:52 PM Reason for block: surgical anesthesia Staffing Performed: resident/CRNA  Resident/CRNA: Nelda Marseille, CRNA Preanesthetic Checklist Completed: patient identified, IV checked, site marked, risks and benefits discussed, surgical consent, monitors and equipment checked, pre-op evaluation and timeout performed Spinal Block Patient position: sitting Prep: Betadine Patient monitoring: heart rate, continuous pulse ox, blood pressure and cardiac monitor Approach: midline Location: L3-4 Injection technique: single-shot Needle Needle type: Quincke  Needle gauge: 22 G Needle length: 9 cm Assessment Sensory level: T10 Events: CSF return Additional Notes Negative paresthesia. Negative blood return. Positive free-flowing CSF. Expiration date of kit checked and confirmed. Patient tolerated procedure well, without complications.

## 2020-08-04 ENCOUNTER — Encounter: Payer: Self-pay | Admitting: Orthopedic Surgery

## 2020-08-04 LAB — BASIC METABOLIC PANEL
Anion gap: 7 (ref 5–15)
BUN: 27 mg/dL — ABNORMAL HIGH (ref 8–23)
CO2: 23 mmol/L (ref 22–32)
Calcium: 8.2 mg/dL — ABNORMAL LOW (ref 8.9–10.3)
Chloride: 100 mmol/L (ref 98–111)
Creatinine, Ser: 0.71 mg/dL (ref 0.44–1.00)
GFR, Estimated: >60 mL/min (ref >60)
Glucose, Bld: 136 mg/dL — ABNORMAL HIGH (ref 70–99)
Potassium: 3.9 mmol/L (ref 3.5–5.1)
Sodium: 130 mmol/L — ABNORMAL LOW (ref 135–145)

## 2020-08-04 LAB — CBC
HCT: 28.9 % — ABNORMAL LOW (ref 36.0–46.0)
Hemoglobin: 9.8 g/dL — ABNORMAL LOW (ref 12.0–15.0)
MCH: 29.4 pg (ref 26.0–34.0)
MCHC: 33.9 g/dL (ref 30.0–36.0)
MCV: 86.8 fL (ref 80.0–100.0)
Platelets: 213 10*3/uL (ref 150–400)
RBC: 3.33 MIL/uL — ABNORMAL LOW (ref 3.87–5.11)
RDW: 16.2 % — ABNORMAL HIGH (ref 11.5–15.5)
WBC: 12.3 10*3/uL — ABNORMAL HIGH (ref 4.0–10.5)
nRBC: 0 % (ref 0.0–0.2)

## 2020-08-04 MED ORDER — METOPROLOL TARTRATE 5 MG/5ML IV SOLN: 2.5000 mg | Freq: Four times a day (QID) | INTRAVENOUS | Status: DC | PRN

## 2020-08-04 MED ORDER — ONDANSETRON HCL 4 MG PO TABS: 4.0000 mg | ORAL_TABLET | Freq: Four times a day (QID) | ORAL | Status: DC | PRN

## 2020-08-04 MED ORDER — METOCLOPRAMIDE HCL 10 MG PO TABS: 5.0000 mg | ORAL_TABLET | Freq: Three times a day (TID) | ORAL | Status: DC | PRN

## 2020-08-04 MED ORDER — APIXABAN 5 MG PO TABS: 5.0000 mg | ORAL_TABLET | Freq: Two times a day (BID) | ORAL | Status: DC

## 2020-08-04 MED ORDER — ONDANSETRON HCL 4 MG/2ML IJ SOLN: 4.0000 mg | Freq: Four times a day (QID) | INTRAMUSCULAR | Status: DC | PRN

## 2020-08-04 MED ORDER — APIXABAN 2.5 MG PO TABS
2.5000 mg | ORAL_TABLET | Freq: Two times a day (BID) | ORAL | Status: DC
  Administered 2020-08-04 – 2020-08-06 (×4): 2.5 mg via ORAL
  Filled 2020-08-04 (×4): qty 1

## 2020-08-04 MED ORDER — DOCUSATE SODIUM 100 MG PO CAPS
100.0000 mg | ORAL_CAPSULE | Freq: Two times a day (BID) | ORAL | Status: DC
  Administered 2020-08-04 – 2020-08-05 (×3): 100 mg via ORAL
  Filled 2020-08-04 (×3): qty 1

## 2020-08-04 MED ORDER — SODIUM CHLORIDE 0.9 % IV SOLN: INTRAVENOUS | Status: AC

## 2020-08-04 MED ORDER — METOCLOPRAMIDE HCL 5 MG/ML IJ SOLN: 5.0000 mg | Freq: Three times a day (TID) | INTRAMUSCULAR | Status: DC | PRN

## 2020-08-04 NOTE — Progress Notes (Signed)
Pt arrived as a transfer from 2A.  She is alert to self and denying any pain at this time.  Daughter Jan is at bedside. RL immobilizer on and foley is intact.  Hard cast in place to pts R wrist.  Skin is very fraile with several skin tears and abrasions.  IV present to pts L anterior forearm.  Pt is wearing glasses and was placed on tele box 40-38.  Daughters will take turns staying the night with pt as pt will sundown overnight.  Mgmt is aware.

## 2020-08-04 NOTE — Evaluation (Signed)
Physical Therapy Evaluation Patient Details Name: Kristen Osborn MRN: [id number] DOB: [birth year] Today's Date: 08/04/2020   History of Present Illness  Pt is a 87 yo female s/p ORIF of periprosthetic R hip fx. Hospital stay complicated by urinary retention and low hemoglobin, s/p 2 transfusions, episodes of afib with RVR.PMH of CHF, HTN, afib, pacemaker, dementia.    Clinical Impression  Patient alert, sitting EOB with OT, PT/OT overlap for OOB mobility attempts. Sit <> Stand attempted twice, second attempt pt able to come up into standing, but unable to maintain NWB precautions (PT foot underneath pt's foot to assist/assess). Returned to supine maxX2, and rolling in bed performed maxA for linen adjustment as well. Several supine exercises performed, but pt very limited throughout session due to pain. Pt with family and RN in room at end of session.  Overall the patient demonstrated deficits (see "PT Problem List") that impede the patient's functional abilities, safety, and mobility and would benefit from skilled PT intervention. Recommendation is HHPT with 24/7 supervision/assistance, family confident that they can provide this care. Recommendation of equipment includes hospital bed.     Follow Up Recommendations Home health PT;Supervision/Assistance - 24 hour    Equipment Recommendations  Hospital bed    Recommendations for Other Services       Precautions / Restrictions Precautions Precautions: Fall Restrictions Weight Bearing Restrictions: Yes RLE Weight Bearing: Non weight bearing      Mobility  Bed Mobility Overal bed mobility: Needs Assistance Bed Mobility: Supine to Sit;Sit to Supine;Rolling Rolling: Max assist   Supine to sit: Mod assist Sit to supine: Max assist;+2 for safety/equipment        Transfers Overall transfer level: Needs assistance Equipment used: Rolling walker (2 wheeled) Transfers: Sit to/from Stand Sit to Stand: Max assist;+2 physical  assistance         General transfer comment: PT foot under pt foot to ensure NWB status, pt unable to maintain NWB, returned to sitting  Ambulation/Gait             General Gait Details: unable  Stairs            Wheelchair Mobility    Modified Rankin (Stroke Patients Only)       Balance Overall balance assessment: Needs assistance Sitting-balance support: Feet supported Sitting balance-Leahy Scale: Fair       Standing balance-Leahy Scale: Zero                               Pertinent Vitals/Pain Pain Assessment: Faces Faces Pain Scale: Hurts worst Pain Location: R hip Pain Descriptors / Indicators: Moaning;Guarding;Grimacing Pain Intervention(s): Limited activity within patient's tolerance;Monitored during session;Premedicated before session;Patient requesting pain meds-RN notified;Repositioned    Home Living Family/patient expects to be discharged to:: Private residence Living Arrangements: Children Available Help at Discharge: Family;Available 24 hours/day Type of Home: House Home Access: Stairs to enter Entrance Stairs-Rails: Right Entrance Stairs-Number of Steps: 2 steps with R railing into home (then has 3 steps with R railing to kitchen but can stay on one level) Home Layout: One level Home Equipment: Walker - 2 wheels;Walker - standard;Bedside commode;Shower seat - built in;Grab bars - tub/shower      Prior Function Level of Independence: Independent         Comments: 3 daughters take turns providing 24/7 assist     Hand Dominance   Dominant Hand: Right    Extremity/Trunk Assessment   Upper Extremity  Assessment Upper Extremity Assessment: Defer to OT evaluation    Lower Extremity Assessment Lower Extremity Assessment: RLE deficits/detail;LLE deficits/detail (R knee immobilizer in place, doffed for exercises, donned at end of session) RLE Deficits / Details: deferred LLE Deficits / Details: WFLs    Cervical / Trunk  Assessment Cervical / Trunk Assessment: Normal  Communication   Communication: No difficulties  Cognition Arousal/Alertness: Awake/alert Behavior During Therapy: WFL for tasks assessed/performed Overall Cognitive Status: History of cognitive impairments - at baseline                                        General Comments      Exercises General Exercises - Lower Extremity Ankle Circles/Pumps: AROM;Both;10 reps Heel Slides: AAROM;Strengthening;Right;10 reps;AROM;Left Hip ABduction/ADduction: AROM;Strengthening;Left;10 reps;AAROM;Right   Assessment/Plan    PT Assessment Patient needs continued PT services  PT Problem List Decreased strength;Decreased range of motion;Decreased activity tolerance;Decreased balance;Decreased mobility;Decreased knowledge of precautions;Pain;Decreased safety awareness;Decreased knowledge of use of DME       PT Treatment Interventions DME instruction;Balance training;Gait training;Neuromuscular re-education;Stair training;Functional mobility training;Patient/family education;Therapeutic activities;Therapeutic exercise    PT Goals (Current goals can be found in the Care Plan section)  Acute Rehab PT Goals Patient Stated Goal: to get home, and get back to walking if she can PT Goal Formulation: With family Time For Goal Achievement: 08/18/20 Potential to Achieve Goals: Good    Frequency BID   Barriers to discharge        Co-evaluation PT/OT/SLP Co-Evaluation/Treatment: Yes Reason for Co-Treatment: For patient/therapist safety;To address functional/ADL transfers PT goals addressed during session: Mobility/safety with mobility;Proper use of DME;Balance OT goals addressed during session: Proper use of Adaptive equipment and DME       AM-PAC PT "6 Clicks" Mobility  Outcome Measure Help needed turning from your back to your side while in a flat bed without using bedrails?: A Lot Help needed moving from lying on your back to  sitting on the side of a flat bed without using bedrails?: A Lot Help needed moving to and from a bed to a chair (including a wheelchair)?: Total Help needed standing up from a chair using your arms (e.g., wheelchair or bedside chair)?: Total Help needed to walk in hospital room?: Total Help needed climbing 3-5 steps with a railing? : Total 6 Click Score: 8    End of Session Equipment Utilized During Treatment: Gait belt Activity Tolerance: Patient limited by pain Patient left: in bed;with family/visitor present;with call bell/phone within reach;with bed alarm set;with nursing/sitter in room Nurse Communication: Mobility status PT Visit Diagnosis: Other abnormalities of gait and mobility (R26.89);Muscle weakness (generalized) (M62.81);Other symptoms and signs involving the nervous system (R29.898);Pain Pain - Right/Left: Right Pain - part of body: Hip    Time: 5852-7782 PT Time Calculation (min) (ACUTE ONLY): 28 min   Charges:   PT Evaluation $PT Eval Low Complexity: 1 Low PT Treatments $Therapeutic Exercise: 23-37 mins        Olga Coaster PT, DPT 3:09 PM,08/04/20

## 2020-08-04 NOTE — Progress Notes (Signed)
PROGRESS NOTE  Kristen MaclachlanVirginia W Janowski Osborn:[id number]:[id number] DOB: 24-Apr-1927 DOA: 07/31/2020 PCP: Danella PentonMiller, Mark F, MD  HPI/Recap of past 24 hours:  Kristen Osborn is a 87 y.o. female with medical history significant for primary hypertension, peripheral artery disease, atrial fibrillation on Eliquis, diastolic heart failure, Alzheimer dementia, status post permanent pacemaker, presents to the emergency department from home due to witnessed mechanical fall.    Work-up revealed periprosthetic fracture of the right femur.  Orthopedic surgery consulted, surgical intervention delayed due to recent use of DOAC, Eliquis, which is on hold.  Hospital course complicated by acute urinary retention for which a Foley catheter was placed on 08/01/2020.  Will attempt a voiding trial a day after her surgery.  Also complicated by acute blood loss with hemoglobin dropped down to 6.5, she is currently post 2 unit PRBC transfusion.  Post right hip repair on 08/03/2020 by Dr. Odis LusterBowers.  08/04/20: POD #1 post right hip repair.  She is alert and pleasantly confused.   Assessment/Plan: Principal Problem:   Closed right hip fracture (HCC) Active Problems:   Alzheimer's dementia without behavioral disturbance (HCC)   Atrial fibrillation and flutter (HCC)   Benign essential HTN   PAD (peripheral artery disease) (HCC)   Status post placement of cardiac pacemaker   Chronic anticoagulation   Closed displaced fracture of right femoral neck (HCC)   Protein-calorie malnutrition, severe   Closed right hip fracture, initial encounter (HCC)  POD #1 post right hip repair from closed right hip fracture post mechanical fall Oblique displaced perihardware fracture about the femoral component of the right hip total arthroplasty seen on x-ray. Right hip repair on 08/03/2020 by Dr. Odis LusterBowers. Continue analgesics. Management per orthopedic surgery. PT OT has assessed and recommended home health PT OT. TOC consulted to assist with home health  services  Permanent A. Fib, on Eliquis PTA Okay to restart Eliquis per orthopedic surgery. She is rate controlled on p.o. Lopressor 25 mg every 6 hours Continue to closely monitor on telemetry. IV Lopressor as needed with parameters.  Acute blood loss anemia post 2 unit PRBC transfusion. She received 2 unit PRBC transfusion for hemoglobin of 6.5 on 08/02/2020. Hemoglobin stable post surgery 9.8 from 9.3.  Leukocytosis likely reactive in the setting of orthopedic surgery Afebrile Repeat CBC in the morning  Vitamin D3 deficiency Vitamin D3 level and indicated will deficiency, 26. Continue vitamin D3 replacement.  Improved chronic hyponatremia on salt tablets prior to admission Serum sodium 130 from 127 She is currently asymptomatic. Continue home salt tablets Monitor sodium level  Resolving AKI, likely prerenal in the setting of dehydration, poor oral intake Baseline creatinine appears to be 0.5 with GFR greater than 60 Presented with creatinine of 0.99 with GFR 54. Creatinine is downtrending 0.7 Continue gentle IV fluid hydration normal saline at 50 cc/h x 1 day.  Acute urinary retention Unable to void morning of 08/02/2020. Greater than 300 cc urine retained from bladder scan We will attempt voiding trial after surgery likely on 08/05/2020. If she fails voiding trial will replace Foley catheter and refer to urology for voiding trial in the clinic.    Hyperglycemia with no known history of diabetes Presented withSerum glucose 187 Will obtain hemoglobin A1c We will start insulin sliding scale as needed  Chronic normocytic anemia Stable as stated above.  Physical debility PT OT to assess post surgery with guidance of orthopedic surgery. Continue fall precautions.  Per her daughter she was recently treated for urinary tract infection at outside facility  Home oral antibiotics on hold She was started on Rocephin empirically Urine culture negative to date Will DC  antibiotics after removal of Foley catheter possibly on 08/05/2020.    Code Status: DNR  Family Communication: Updated her daughter at bedside.  Disposition Plan: Home with home health services likely on 08/06/2020.   Consultants:  Orthopedic surgery  Procedures:  None.  Antimicrobials:  Rocephin  DVT prophylaxis: SCDs.  Status is: Inpatient.  Patient will likely require at least 2 midnight for further evaluation and treatment of present condition.  Dispo:  Patient From: Home  Planned Disposition: Home with Health Care Svc with home health services  Medically stable for discharge: No, management of right hip fracture.       Objective: Vitals:   08/04/20 1020 08/04/20 1241 08/04/20 1555 08/04/20 1555  BP:  (!) 149/85 138/80 138/80  Pulse:  (!) 108 (!) 101 (!) 101  Resp:  18 16 16   Temp:  97.7 F (36.5 C) 98.6 F (37 C) 98.6 F (37 C)  TempSrc:      SpO2:  94%  95%  Weight: 49.5 kg     Height:        Intake/Output Summary (Last 24 hours) at 08/04/2020 1728 Last data filed at 08/04/2020 1422 Gross per 24 hour  Intake 2410 ml  Output 1050 ml  Net 1360 ml   Filed Weights   08/03/20 0900 08/04/20 0400 08/04/20 1020  Weight: 48.4 kg 46.9 kg 49.5 kg    Exam:  . General: 87 y.o. year-old female frail-appearing, alert and pleasantly confused.  . Cardiovascular: Irregular rate and rhythm no rubs or gallops. 82 Respiratory: Clear to auscultation no wheezes or rales. . Abdomen: Soft nontender no bowel sounds present.   . Skin: No ulcerative lesions noted. Marland Kitchen Psychiatry: Mood is appropriate for condition and setting.   Data Reviewed: CBC: Recent Labs  Lab 07/31/20 1450 08/01/20 0811 08/02/20 0616 08/02/20 1944 08/02/20 2330 08/03/20 0701 08/04/20 0618  WBC 9.6 10.0 8.9  --  9.6 10.0 12.3*  NEUTROABS 6.1  --   --   --   --   --   --   HGB 10.6* 8.8* 6.5* 9.5* 9.6* 9.3* 9.8*  HCT 33.0* 26.1* 20.0*  --  28.6* 28.0* 28.9*  MCV 90.9 88.5 89.7  --   87.7 87.8 86.8  PLT 239 194 138*  --  140* 135* 213   Basic Metabolic Panel: Recent Labs  Lab 08/01/20 0747 08/02/20 0616 08/02/20 2330 08/03/20 0701 08/04/20 0618  NA 127* 128* 132* 130* 130*  K 4.7 4.0 3.9 3.8 3.9  CL 94* 96* 99 98 100  CO2 25 24 24 25 23   GLUCOSE 130* 140* 152* 123* 136*  BUN 32* 28* 28* 24* 27*  CREATININE 0.84 0.83 0.57 0.61 0.71  CALCIUM 8.9 8.2* 8.1* 8.3* 8.2*  MG 1.8 1.9 1.8  --   --   PHOS  --  3.4 3.0  --   --    GFR: Estimated Creatinine Clearance: 35.8 mL/min (by C-G formula based on SCr of 0.71 mg/dL). Liver Function Tests: No results for input(s): AST, ALT, ALKPHOS, BILITOT, PROT, ALBUMIN in the last 168 hours. No results for input(s): LIPASE, AMYLASE in the last 168 hours. No results for input(s): AMMONIA in the last 168 hours. Coagulation Profile: Recent Labs  Lab 08/03/20 0701  INR 1.2   Cardiac Enzymes: Recent Labs  Lab 07/31/20 1450 08/01/20 0747  CKTOTAL 106 86   BNP (last 3  results) No results for input(s): PROBNP in the last 8760 hours. HbA1C: No results for input(s): HGBA1C in the last 72 hours. CBG: No results for input(s): GLUCAP in the last 168 hours. Lipid Profile: No results for input(s): CHOL, HDL, LDLCALC, TRIG, CHOLHDL, LDLDIRECT in the last 72 hours. Thyroid Function Tests: No results for input(s): TSH, T4TOTAL, FREET4, T3FREE, THYROIDAB in the last 72 hours. Anemia Panel: No results for input(s): VITAMINB12, FOLATE, FERRITIN, TIBC, IRON, RETICCTPCT in the last 72 hours. Urine analysis:    Component Value Date/Time   COLORURINE YELLOW (A) 08/01/2020 1215   APPEARANCEUR HAZY (A) 08/01/2020 1215   APPEARANCEUR Hazy 08/05/2011 1551   LABSPEC 1.021 08/01/2020 1215   LABSPEC 1.014 08/05/2011 1551   PHURINE 5.0 08/01/2020 1215   GLUCOSEU NEGATIVE 08/01/2020 1215   GLUCOSEU Negative 08/05/2011 1551   HGBUR NEGATIVE 08/01/2020 1215   BILIRUBINUR NEGATIVE 08/01/2020 1215   BILIRUBINUR Negative 08/05/2011 1551    KETONESUR NEGATIVE 08/01/2020 1215   PROTEINUR 30 (A) 08/01/2020 1215   NITRITE NEGATIVE 08/01/2020 1215   LEUKOCYTESUR NEGATIVE 08/01/2020 1215   LEUKOCYTESUR 3+ 08/05/2011 1551   Sepsis Labs: @LABRCNTIP (procalcitonin:4,lacticidven:4)  ) Recent Results (from the past 240 hour(s))  Resp Panel by RT-PCR (Flu A&B, Covid) Nasopharyngeal Swab     Status: None   Collection Time: 07/31/20  4:20 PM   Specimen: Nasopharyngeal Swab; Nasopharyngeal(NP) swabs in vial transport medium  Result Value Ref Range Status   SARS Coronavirus 2 by RT PCR NEGATIVE NEGATIVE Final    Comment: (NOTE) SARS-CoV-2 target nucleic acids are NOT DETECTED.  The SARS-CoV-2 RNA is generally detectable in upper respiratory specimens during the acute phase of infection. The lowest concentration of SARS-CoV-2 viral copies this assay can detect is 138 copies/mL. A negative result does not preclude SARS-Cov-2 infection and should not be used as the sole basis for treatment or other patient management decisions. A negative result may occur with  improper specimen collection/handling, submission of specimen other than nasopharyngeal swab, presence of viral mutation(s) within the areas targeted by this assay, and inadequate number of viral copies(<138 copies/mL). A negative result must be combined with clinical observations, patient history, and epidemiological information. The expected result is Negative.  Fact Sheet for Patients:  08/02/20  Fact Sheet for Healthcare Providers:  BloggerCourse.com  This test is no t yet approved or cleared by the [employer] FDA and  has been authorized for detection and/or diagnosis of SARS-CoV-2 by FDA under an Emergency Use Authorization (EUA). This EUA will remain  in effect (meaning this test can be used) for the duration of the COVID-19 declaration under Section 564(b)(1) of the Act, 21 U.S.C.section 360bbb-3(b)(1),  unless the authorization is terminated  or revoked sooner.       Influenza A by PCR NEGATIVE NEGATIVE Final   Influenza B by PCR NEGATIVE NEGATIVE Final    Comment: (NOTE) The Xpert Xpress SARS-CoV-2/FLU/RSV plus assay is intended as an aid in the diagnosis of influenza from Nasopharyngeal swab specimens and should not be used as a sole basis for treatment. Nasal washings and aspirates are unacceptable for Xpert Xpress SARS-CoV-2/FLU/RSV testing.  Fact Sheet for Patients: [place]  Fact Sheet for Healthcare Providers: BloggerCourse.com  This test is not yet approved or cleared by the [employer] FDA and has been authorized for detection and/or diagnosis of SARS-CoV-2 by FDA under an Emergency Use Authorization (EUA). This EUA will remain in effect (meaning this test can be used) for the duration of the COVID-19 declaration  under Section 564(b)(1) of the Act, 21 U.S.C. section 360bbb-3(b)(1), unless the authorization is terminated or revoked.  Performed at [facility], [address], [place], [place] [address]   Urine Culture     Status: None   Collection Time: 08/01/20 12:15 PM   Specimen: Urine, Random  Result Value Ref Range Status   Specimen Description   Final    URINE, RANDOM Performed at [facility], [address], [place], [place] [address]    Special Requests   Final    NONE Performed at [facility], [address], [place], [place] [address]    Culture   Final    NO GROWTH Performed at [facility], [address], [place], [place] [address]    Report Status 08/03/2020 FINAL  Final      Studies: DG HIP OPERATIVE UNILAT W OR W/O PELVIS RIGHT  Result Date: 08/03/2020 CLINICAL DATA:  Right hip ORIF. EXAM: OPERATIVE RIGHT HIP (WITH PELVIS IF PERFORMED) TECHNIQUE: Fluoroscopic spot image(s) were submitted for interpretation post-operatively. COMPARISON:   Preoperative radiographs 01/01/2012 FINDINGS: Five fluoroscopic spot views of the right proximal femur obtained in the operating room. Lateral plate, screws, and cerclage wire fixation of periprosthetic 3 proximal femur fracture. Fluoroscopy time not provided. IMPRESSION: Procedural fluoroscopy during ORIF periprosthetic femur fracture. Electronically Signed   By: Narda Rutherford M.D.   On: 08/03/2020 20:21    Scheduled Meds: . apixaban  2.5 mg Oral BID  . Chlorhexidine Gluconate Cloth  6 each Topical Daily  . cholecalciferol  1,000 Units Oral Daily  . docusate  100 mg Oral BID  . docusate sodium  100 mg Oral BID  . feeding supplement  237 mL Oral TID BM  . ferrous sulfate  325 mg Oral Q breakfast  . losartan  25 mg Oral Daily  . metoprolol tartrate  25 mg Oral Q6H  . multivitamin with minerals  1 tablet Oral Daily  . pantoprazole  40 mg Oral Daily  . QUEtiapine  25 mg Oral QHS  . sodium chloride  1 g Oral Daily  . vitamin B-12  1,000 mcg Oral Daily    Continuous Infusions: . cefTRIAXone (ROCEPHIN)  IV 1 g (08/02/20 2306)     LOS: 3 days     Darlin Drop, MD [facility] Pager [phone]  If 7PM-7AM, please contact night-coverage [email or web address] Password [facility] 08/04/2020, 5:28 PM

## 2020-08-04 NOTE — Anesthesia Postprocedure Evaluation (Signed)
Anesthesia Post Note  Patient: Kristen Osborn  Procedure(s) Performed: OPEN REDUCTION INTERNAL FIXATION (ORIF) PERIPROSTHETIC FRACTURE (Right )  Patient location during evaluation: Nursing Unit Anesthesia Type: Spinal Level of consciousness: oriented and awake and alert Pain management: pain level controlled Vital Signs Assessment: post-procedure vital signs reviewed and stable Respiratory status: spontaneous breathing and respiratory function stable Cardiovascular status: blood pressure returned to baseline and stable Postop Assessment: no headache, no backache, no apparent nausea or vomiting and patient able to bend at knees Anesthetic complications: no   No complications documented.   Last Vitals:  Vitals:   08/04/20 0001 08/04/20 0400  BP: (!) 158/104 (!) 153/74  Pulse: (!) 110 (!) 102  Resp: 17 15  Temp: 37.1 C 36.9 C  SpO2: 96% 99%    Last Pain:  Vitals:   08/04/20 0400  TempSrc: Oral  PainSc: 0-No pain                 Starling Manns

## 2020-08-04 NOTE — Progress Notes (Signed)
When patient first arrived to floor she was agitated, ripping off tele monitor, and not staying still long enough to obtain accurate vitals. Patient oriented to self only and very forgetful. Daughter at bedside. Patient eventually calmed down and was agreeable to care. HR now 100s- 140s but does not sustain. Manuela Schwartz, NP aware of HR. Scheduled PO metop given. No new orders. Will notify Steward Drone again if HR sustains high. Fall precautions maintained, call bell within reach. patient able to make her needs know.

## 2020-08-04 NOTE — Evaluation (Addendum)
Occupational Therapy Evaluation Patient Details Name: Kristen Osborn MRN: [id number] DOB: Oct 06, 1926 Today's Date: 08/04/2020    History of Present Illness Pt is a 87 yo female s/p ORIF of periprosthetic R hip fx. Hospital stay complicated by urinary retention and low hemoglobin, s/p 2 transfusions, episodes of afib with RVR.PMH of CHF, HTN, afib, pacemaker, dementia.   Clinical Impression   Pt seen for OT evaluation this date. Prior to Feb 2022 admission, pt was independent with functional mobility. Pt's 3 daughters took turns providing 24/7 assist. Following previous hospital admission, pt was at rehab for 2 weeks before returning home and receiving home health OT/PT. Pt progressed to walking short household distances without AD. Pt currently requires MOD assist for LB dressing while in long-chair position, MOD A for supine>sit transfers, and MAX A+2 for sit>stand transfers due to pain and limited AROM of R hip. Pt was unable to recall situation and verbalize/adhere to non-weightbearing precautions during functional mobility. Following x1 standing attempt, pt was returned to supine, requiring MAX A+2. Of note, HR 100s at start of session, with HR increasing to 120s during functional mobility. HR returned to 100s following cessation of activity. Pt and family would benefit from additional instruction in self care skills and techniques to help maintain precautions, with or without assistive devices, to support recall and carryover prior to discharge. Upon discharge, recommend 24/7 assistance/supervision from family (family stated they are confident they can provide appropriate physical assist) and HHOT services.    Follow Up Recommendations  Home health OT;Supervision/Assistance - 24 hour    Equipment Recommendations  Hospital bed       Precautions / Restrictions Precautions Precautions: Fall Required Braces or Orthoses: Splint/Cast Splint/Cast: Splint for R distal radius fx (weight bear  through elbow only); per family, cast originally planned to be removed 3/29, however present during evaluation Splint/Cast - Date Prophylactic Dressing Applied (if applicable): 06/24/20 Restrictions Weight Bearing Restrictions: Yes RLE Weight Bearing: Non weight bearing      Mobility Bed Mobility Overal bed mobility: Needs Assistance Bed Mobility: Supine to Sit;Sit to Supine;Rolling Rolling: Max assist   Supine to sit: Mod assist Sit to supine: Max assist;+2 for safety/equipment        Transfers Overall transfer level: Needs assistance Equipment used: Rolling walker (2 wheeled) Transfers: Sit to/from Stand Sit to Stand: Max assist;+2 physical assistance         General transfer comment: PT foot under pt foot to ensure NWB status, pt unable to maintain NWB, returned to sitting    Balance Overall balance assessment: Needs assistance Sitting-balance support: Single extremity supported;Feet supported Sitting balance-Leahy Scale: Fair Sitting balance - Comments: Fair sitting balance at EOB     Standing balance-Leahy Scale: Zero                             ADL either performed or assessed with clinical judgement   ADL Overall ADL's : Needs assistance/impaired                     Lower Body Dressing: Moderate assistance;Bed level Lower Body Dressing Details (indicate cue type and reason): To don/doff socks. Pt able to don/doff L independently. Required MAX assist to don/doff R               General ADL Comments: MOD A to don/doff socks at bed-level. Anticipate pt SUPERVISION for bed-level UB ADLs  Pertinent Vitals/Pain Pain Assessment: Faces Faces Pain Scale: Hurts worst Pain Location: R hip Pain Descriptors / Indicators: Moaning;Guarding;Grimacing Pain Intervention(s): Limited activity within patient's tolerance;Monitored during session;Premedicated before session;Patient requesting pain meds-RN notified;Repositioned      Hand Dominance Right   Extremity/Trunk Assessment Upper Extremity Assessment Upper Extremity Assessment: Generalized weakness;RUE deficits/detail RUE: Unable to fully assess due to immobilization   Lower Extremity Assessment Lower Extremity Assessment: Defer to PT evaluation RLE Deficits / Details: deferred LLE Deficits / Details: WFLs   Cervical / Trunk Assessment Cervical / Trunk Assessment: Normal   Communication Communication Communication: No difficulties   Cognition Arousal/Alertness: Awake/alert Behavior During Therapy: WFL for tasks assessed/performed Overall Cognitive Status: History of cognitive impairments - at baseline                                 General Comments: Pt A&Ox1 (is aware that she is not at home). Required frequent cues to re-orient pt to situation and frequent multi-modal cues for sequencing tasks      Exercises  Other Exercises: Education provided to pt and daughter (Jan) on R hip precautions, with daughter verbalizing understanding, however pt unable to adhere to precautions during functional mobility        Home Living Family/patient expects to be discharged to:: Private residence Living Arrangements: Children Available Help at Discharge: Family;Available 24 hours/day Type of Home: House Home Access: Stairs to enter [employer] of Steps: 2 steps with R railing into home (then has 3 steps with R railing to kitchen but can stay on one level) Entrance Stairs-Rails: Right Home Layout: One level     Bathroom Shower/Tub: Producer, television/film/video: Standard     Home Equipment: Environmental consultant - 2 wheels;Walker - standard;Bedside commode;Shower seat - built in;Grab bars - tub/shower          Prior Functioning/Environment Level of Independence: Needs assistance        Comments: Prior to Feb 2022 admission, pt was independent with functional mobility w/out AD. Pt's 3 daughters took turns providing 24/7 assist.  Following previous hospital admission, pt was at rehab for 2 weeks before returning home and receiving home health OT/PT and continued assistance from daughters. Pt progressed to walking short household distances without AD        OT Problem List: Decreased strength;Decreased range of motion;Decreased activity tolerance;Impaired balance (sitting and/or standing);Decreased cognition;Decreased knowledge of precautions;Pain;Impaired UE functional use      OT Treatment/Interventions: Self-care/ADL training;Therapeutic exercise;DME and/or AE instruction;Therapeutic activities;Patient/family education;Balance training    OT Goals(Current goals can be found in the care plan section) Acute Rehab OT Goals Patient Stated Goal: to go home OT Goal Formulation: With patient/family Time For Goal Achievement: 08/18/20 Potential to Achieve Goals: Good ADL Goals Pt Will Perform Grooming: with set-up;with supervision;sitting Pt Will Transfer to Toilet: with min assist;stand pivot transfer;bedside commode Pt Will Perform Toileting - Clothing Manipulation and hygiene: with min assist;sit to/from stand  OT Frequency: Min 1X/week           Co-evaluation   Reason for Co-Treatment: Complexity of the patient's impairments (multi-system involvement);To address functional/ADL transfers PT goals addressed during session: Mobility/safety with mobility;Proper use of DME;Balance OT goals addressed during session: ADL's and self-care;Proper use of Adaptive equipment and DME      AM-PAC OT "6 Clicks" Daily Activity     Outcome Measure Help from another person eating meals?: None Help from another person  taking care of personal grooming?: A Little Help from another person toileting, which includes using toliet, bedpan, or urinal?: A Lot Help from another person bathing (including washing, rinsing, drying)?: A Lot Help from another person to put on and taking off regular upper body clothing?: A Little Help from  another person to put on and taking off regular lower body clothing?: A Lot 6 Click Score: 16   End of Session Equipment Utilized During Treatment: Gait belt;Rolling walker Nurse Communication: Mobility status  Activity Tolerance: Patient tolerated treatment well Patient left: in bed;with call bell/phone within reach;with bed alarm set;with family/visitor present  OT Visit Diagnosis: Unsteadiness on feet (R26.81);Muscle weakness (generalized) (M62.81);Repeated falls (R29.6)                Time: 3474-2595 OT Time Calculation (min): 26 min Charges:  OT General Charges $OT Visit: 1 Visit OT Evaluation $OT Eval Moderate Complexity: 1 Mod  Matthew Folks, OTR/L [facility] [phone]

## 2020-08-04 NOTE — TOC Initial Note (Signed)
Transition of Care [facility]) - Initial/Assessment Note    Patient Details  Name: Kristen Osborn MRN: [id number] Date of Birth: 07-18-1926  Transition of Care [facility] CM/SW Contact:    Kristen Ely, RN Phone Number: 08/04/2020, 1:37 PM  Clinical Narrative:  TOC assessment done via phone with daughter Kristen Osborn. Daughter states patient lives in home at [place], with family rotating days, 3 daughters, therefore she does not live alone. Daughters take care of shopping, cooking, picking up meds at [facility], and taking patients to medical appointments. Patients ambulates with rolling walker, also have handicapped equipped bathroom with walk in tub and commode riser. [facility] provides [employer], PT/RN/ST. Daughter states the she and her sisters decided not to send patient to SNF for Rehab. They will continue with [facility], order hospital bed and bedside commode. They will continue to rotate staying with her. TOC to track and assist with other discharge needs.             Expected Discharge Plan: Home w Home Health Services Barriers to Discharge: Continued Medical Work up   Patient Goals and CMS Choice Patient states their goals for this hospitalization and ongoing recovery are:: Per Daughter, Kristen Osborn patient to return home.   Choice offered to / list presented to : NA  Expected Discharge Plan and Services Expected Discharge Plan: Home w Home Health Services In-house Referral: Clinical Social Work   Post Acute Care Choice: Home Health Living arrangements for the past 2 months: Single Family Home                           HH Arranged: Speech Therapy,PT,OT HH Agency: [facility] Date HH Agency Contacted: 08/04/20 Time HH Agency Contacted: 1100 Representative spoke with at [facility] Agency: [place]  Prior Living Arrangements/Services Living arrangements for the past 2 months: Single Family Home Lives with:: Self,Adult Children          Need for Family Participation in  Patient Care: Yes (Comment) Care giver support system in place?: Yes (comment) Current home services: Home PT,Home RN,DME Criminal Activity/Legal Involvement Pertinent to Current Situation/Hospitalization: No - Comment as needed  Activities of Daily Living      Permission Sought/Granted                  Emotional Assessment   Attitude/Demeanor/Rapport: Unable to Assess Affect (typically observed): Unable to Assess   Alcohol / Substance Use: Not Applicable Psych Involvement: No (comment)  Admission diagnosis:  Fall [W19.XXXA] Closed left hip fracture (HCC) [S72.002A] Closed fracture of right femur, unspecified fracture morphology, unspecified portion of femur, initial encounter (HCC) [S72.91XA] Closed right hip fracture, initial encounter [facility]) [S72.001A] Patient Active Problem List   Diagnosis Date Noted  . Closed right hip fracture, initial encounter (HCC) 08/01/2020  . Closed left hip fracture (HCC) 07/31/2020  . Protein-calorie malnutrition, severe 06/26/2020  . Chronic anticoagulation 06/25/2020  . Closed displaced fracture of right femoral neck (HCC) 06/25/2020  . Preoperative clearance 06/25/2020  . Distal radius fracture, right 06/25/2020  . Closed right hip fracture (HCC) 06/25/2020  . PAD (peripheral artery disease) (HCC) 10/17/2018  . Status post placement of cardiac pacemaker 10/17/2018  . Alzheimer's dementia without behavioral disturbance (HCC) 04/17/2018  . Atrial fibrillation and flutter (HCC) 04/17/2018  . Benign essential HTN 03/20/2014   PCP:  Danella Penton, MD Pharmacy:   [facility] [address] [place] - [phone] [place]  [place] [address] Phone: [phone] Fax: [phone]     Social Determinants of Health (SDOH) Interventions    Readmission Risk Interventions Readmission Risk Prevention Plan 08/04/2020  Transportation Screening Complete  PCP or Specialist Appt within 3-5 Days Complete  HRI or Home Care  Consult Complete  Social Work Consult for Recovery Care Planning/Counseling Complete  Palliative Care Screening Not Applicable  Medication Review [employer]) Complete  Some recent data might be hidden

## 2020-08-04 NOTE — Care Management Important Message (Signed)
Important Message  Patient Details  Name: Kristen Osborn MRN: [id number] Date of Birth: 11-04-1926   Medicare Important Message Given:  Yes     Johnell Comings 08/04/2020, 11:23 AM

## 2020-08-04 NOTE — Progress Notes (Signed)
PT Cancellation Note  Patient Details Name: Kristen Osborn MRN: [id number] DOB: Sep 11, 1926   Cancelled Treatment:    Reason Eval/Treat Not Completed: Other (comment). Per staff pt will be transferring units (to 1A) very shortly. PT to re-attempt in PM.   Olga Coaster PT, DPT 11:28 AM,08/04/20

## 2020-08-04 NOTE — Progress Notes (Signed)
  Subjective:  Patient reports pain as mild.  Pleasantly confused. Her daughter is in the room.  Objective:   VITALS:   Vitals:   08/04/20 1020 08/04/20 1241 08/04/20 1555 08/04/20 1555  BP:  (!) 149/85 138/80 138/80  Pulse:  (!) 108 (!) 101 (!) 101  Resp:  18 16 16   Temp:  97.7 F (36.5 C) 98.6 F (37 C) 98.6 F (37 C)  TempSrc:      SpO2:  94%  95%  Weight: 49.5 kg     Height:        PHYSICAL EXAM:  ABD soft Sensation intact distally Dorsiflexion/Plantar flexion intact Incision: scant drainage No cellulitis present Compartment soft  LABS  Results for orders placed or performed during the hospital encounter of 07/31/20 (from the past 24 hour(s))  CBC     Status: Abnormal   Collection Time: 08/04/20  6:18 AM  Result Value Ref Range   WBC 12.3 (H) 4.0 - 10.5 K/uL   RBC 3.33 (L) 3.87 - 5.11 MIL/uL   Hemoglobin 9.8 (L) 12.0 - 15.0 g/dL   HCT 08/06/20 (L) 08.1 - 44.8 %   MCV 86.8 80.0 - 100.0 fL   MCH 29.4 26.0 - 34.0 pg   MCHC 33.9 30.0 - 36.0 g/dL   RDW 18.5 (H) 63.1 - 49.7 %   Platelets 213 150 - 400 K/uL   nRBC 0.0 0.0 - 0.2 %  Basic metabolic panel     Status: Abnormal   Collection Time: 08/04/20  6:18 AM  Result Value Ref Range   Sodium 130 (L) 135 - 145 mmol/L   Potassium 3.9 3.5 - 5.1 mmol/L   Chloride 100 98 - 111 mmol/L   CO2 23 22 - 32 mmol/L   Glucose, Bld 136 (H) 70 - 99 mg/dL   BUN 27 (H) 8 - 23 mg/dL   Creatinine, Ser 08/06/20 0.44 - 1.00 mg/dL   Calcium 8.2 (L) 8.9 - 10.3 mg/dL   GFR, Estimated 3.78 >58 mL/min   Anion gap 7 5 - 15    DG HIP OPERATIVE UNILAT W OR W/O PELVIS RIGHT  Result Date: 08/03/2020 CLINICAL DATA:  Right hip ORIF. EXAM: OPERATIVE RIGHT HIP (WITH PELVIS IF PERFORMED) TECHNIQUE: Fluoroscopic spot image(s) were submitted for interpretation post-operatively. COMPARISON:  Preoperative radiographs 01/01/2012 FINDINGS: Five fluoroscopic spot views of the right proximal femur obtained in the operating room. Lateral plate, screws, and  cerclage wire fixation of periprosthetic 3 proximal femur fracture. Fluoroscopy time not provided. IMPRESSION: Procedural fluoroscopy during ORIF periprosthetic femur fracture. Electronically Signed   By: 01/03/2012 M.D.   On: 08/03/2020 20:21    Assessment/Plan: 1 Day Post-Op   Principal Problem:   Closed right hip fracture (HCC) Active Problems:   Alzheimer's dementia without behavioral disturbance (HCC)   Atrial fibrillation and flutter (HCC)   Benign essential HTN   PAD (peripheral artery disease) (HCC)   Status post placement of cardiac pacemaker   Chronic anticoagulation   Closed displaced fracture of right femoral neck (HCC)   Protein-calorie malnutrition, severe   Closed right hip fracture, initial encounter (HCC)   Up with therapy, encourage NWB but TDWB is OK as well Discharge home with home health  Dressing change tomorrow Continue cast treatment distal radius until seen in the office for repeat x-rays and staple removal   08/05/2020 , MD 08/04/2020, 8:06 PM

## 2020-08-05 ENCOUNTER — Other Ambulatory Visit: Payer: Self-pay

## 2020-08-05 ENCOUNTER — Encounter: Payer: Self-pay | Admitting: Orthopedic Surgery

## 2020-08-05 LAB — BASIC METABOLIC PANEL
Anion gap: 6 (ref 5–15)
BUN: 30 mg/dL — ABNORMAL HIGH (ref 8–23)
CO2: 25 mmol/L (ref 22–32)
Calcium: 8.2 mg/dL — ABNORMAL LOW (ref 8.9–10.3)
Chloride: 100 mmol/L (ref 98–111)
Creatinine, Ser: 0.56 mg/dL (ref 0.44–1.00)
GFR, Estimated: >60 mL/min (ref >60)
Glucose, Bld: 130 mg/dL — ABNORMAL HIGH (ref 70–99)
Potassium: 3.4 mmol/L — ABNORMAL LOW (ref 3.5–5.1)
Sodium: 131 mmol/L — ABNORMAL LOW (ref 135–145)

## 2020-08-05 LAB — CBC
HCT: 26.4 % — ABNORMAL LOW (ref 36.0–46.0)
Hemoglobin: 9 g/dL — ABNORMAL LOW (ref 12.0–15.0)
MCH: 29.6 pg (ref 26.0–34.0)
MCHC: 34.1 g/dL (ref 30.0–36.0)
MCV: 86.8 fL (ref 80.0–100.0)
Platelets: 197 10*3/uL (ref 150–400)
RBC: 3.04 MIL/uL — ABNORMAL LOW (ref 3.87–5.11)
RDW: 15.9 % — ABNORMAL HIGH (ref 11.5–15.5)
WBC: 10.9 10*3/uL — ABNORMAL HIGH (ref 4.0–10.5)
nRBC: 0 % (ref 0.0–0.2)

## 2020-08-05 LAB — PHOSPHORUS
Phosphorus: 2.6 mg/dL (ref 2.5–4.6)
Phosphorus: 2.6 mg/dL (ref 2.5–4.6)

## 2020-08-05 LAB — OSMOLALITY: Osmolality: 282 mOsm/kg (ref 275–295)

## 2020-08-05 LAB — VITAMIN B12: Vitamin B-12: 1510 pg/mL — ABNORMAL HIGH (ref 180–914)

## 2020-08-05 LAB — HEMOGLOBIN A1C
Hgb A1c MFr Bld: 5.2 % (ref 4.8–5.6)
Mean Plasma Glucose: 102.54 mg/dL

## 2020-08-05 LAB — MAGNESIUM
Magnesium: 1.9 mg/dL (ref 1.7–2.4)
Magnesium: 1.9 mg/dL (ref 1.7–2.4)

## 2020-08-05 LAB — VITAMIN D 25 HYDROXY (VIT D DEFICIENCY, FRACTURES): Vit D, 25-Hydroxy: 29.1 ng/mL — ABNORMAL LOW (ref 30–100)

## 2020-08-05 MED ORDER — BISACODYL 5 MG PO TBEC: 10.0000 mg | DELAYED_RELEASE_TABLET | Freq: Every day | ORAL | Status: DC | PRN

## 2020-08-05 MED ORDER — HYDROCODONE-ACETAMINOPHEN 5-325 MG PO TABS: 1.0000 | ORAL_TABLET | ORAL | 0 refills | Status: DC | PRN

## 2020-08-05 MED ORDER — POTASSIUM & SODIUM PHOSPHATES 280-160-250 MG PO PACK
2.0000 | PACK | Freq: Three times a day (TID) | ORAL | Status: AC
  Administered 2020-08-05 (×3): 2 via ORAL
  Filled 2020-08-05 (×3): qty 2

## 2020-08-05 MED ORDER — BISACODYL 10 MG RE SUPP: 10.0000 mg | Freq: Every day | RECTAL | Status: DC | PRN

## 2020-08-05 MED ORDER — BISACODYL 5 MG PO TBEC: 5.0000 mg | DELAYED_RELEASE_TABLET | Freq: Every day | ORAL | Status: DC | PRN

## 2020-08-05 MED ORDER — POLYETHYLENE GLYCOL 3350 17 G PO PACK
17.0000 g | PACK | Freq: Two times a day (BID) | ORAL | Status: DC
  Administered 2020-08-05 (×2): 17 g via ORAL
  Filled 2020-08-05 (×3): qty 1

## 2020-08-05 MED ORDER — BISACODYL 10 MG RE SUPP
10.0000 mg | Freq: Once | RECTAL | Status: AC
  Administered 2020-08-05: 10 mg via RECTAL
  Filled 2020-08-05: qty 1

## 2020-08-05 NOTE — Progress Notes (Signed)
Physical Therapy Treatment Patient Details Name: Kristen Osborn MRN: [id number] DOB: May 02, 1927 Today's Date: 08/05/2020    History of Present Illness Pt is a 87 yo female s/p ORIF of periprosthetic R hip fx. Hospital stay complicated by urinary retention and low hemoglobin, s/p 2 transfusions, episodes of afib with RVR.PMH of CHF, HTN, afib, pacemaker, dementia.    PT Comments    Pt alert, family at bedside. Agreeable to PT. Reported pain with mobility and RLE movement only, no pain signs/symptoms at rest. Pt performed several supine exercises with verbal and tactile cues. Pt demonstrated improved participation in mobility this PM; minA for bed mobility and sit <> stand modA with RW (with platform attachment for RUE) three times. Able to stand ~10-15 seconds on 2nd and 3rd attempt, but did fatigue quickly. TotalA from PT to maintain NWB precautions on RLE. Returned to supine with all needs in reach and family at bedside. The patient would benefit from further skilled PT intervention to continue to progress towards goals. Recommendation remains appropriate.    Follow Up Recommendations  Home health PT;Supervision/Assistance - 24 hour     Equipment Recommendations  Hospital bed    Recommendations for Other Services       Precautions / Restrictions Precautions Precautions: Fall Required Braces or Orthoses: Splint/Cast;Knee Immobilizer - Right Knee Immobilizer - Right: On at all times (off for hygiene and dressing changes only) Splint/Cast: Splint for R distal radius fx (weight bear through elbow only); per family, cast originally planned to be removed 3/29, however present during evaluation Restrictions Weight Bearing Restrictions: Yes RLE Weight Bearing: Non weight bearing Other Position/Activity Restrictions: per Ortho's note, pt OK for TDWB, but still encourage NWB. also per MD knee immobilizer only to be removed for hygiene purposes and dressing changes.    Mobility  Bed  Mobility Overal bed mobility: Needs Assistance Bed Mobility: Supine to Sit;Sit to Supine     Supine to sit: Min assist;HOB elevated Sit to supine: Min assist        Transfers Overall transfer level: Needs assistance Equipment used: Rolling walker (2 wheeled) Transfers: Sit to/from Stand Sit to Stand: Mod assist         General transfer comment: PT maintained NWB precautions with maxA from PT (PT holding RLE off of ground)  Ambulation/Gait             General Gait Details: unable   Stairs             Wheelchair Mobility    Modified Rankin (Stroke Patients Only)       Balance Overall balance assessment: Needs assistance Sitting-balance support: Single extremity supported;Feet supported Sitting balance-Leahy Scale: Fair       Standing balance-Leahy Scale: Poor                              Cognition Arousal/Alertness: Awake/alert Behavior During Therapy: WFL for tasks assessed/performed Overall Cognitive Status: History of cognitive impairments - at baseline                                 General Comments: Pt A&Ox1 (is aware that she is not at home). Required frequent cues to re-orient pt to situation and frequent multi-modal cues for sequencing tasks      Exercises General Exercises - Lower Extremity Ankle Circles/Pumps: AROM;Both;10 reps Heel Slides: Strengthening;10 reps;AROM;Left;AAROM Hip ABduction/ADduction: AROM;Strengthening;Left;10 reps;AAROM;Right  Straight Leg Raises: AAROM;Strengthening;Right;10 reps Other Exercises Other Exercises: sit <> stand three times during session. first attempt pt unable to come up fully into standing due to pain; modA to stand. platform attachment on RW for R wrist due to cast. Second and third attempt pt able to come up into standing with modA as well, and maintained standing ~10seconds or so    General Comments        Pertinent Vitals/Pain Faces Pain Scale: Hurts even  more Pain Location: R hip with mobility Pain Descriptors / Indicators: Moaning;Guarding;Grimacing Pain Intervention(s): Limited activity within patient's tolerance;Monitored during session;Repositioned    Home Living Family/patient expects to be discharged to:: Private residence                    Prior Function            PT Goals (current goals can now be found in the care plan section) Progress towards PT goals: Progressing toward goals    Frequency    BID      PT Plan Current plan remains appropriate    Co-evaluation              AM-PAC PT "6 Clicks" Mobility   Outcome Measure  Help needed turning from your back to your side while in a flat bed without using bedrails?: A Little Help needed moving from lying on your back to sitting on the side of a flat bed without using bedrails?: A Little Help needed moving to and from a bed to a chair (including a wheelchair)?: A Lot Help needed standing up from a chair using your arms (e.g., wheelchair or bedside chair)?: A Lot Help needed to walk in hospital room?: Total Help needed climbing 3-5 steps with a railing? : Total 6 Click Score: 12    End of Session Equipment Utilized During Treatment: Gait belt Activity Tolerance: Patient tolerated treatment well Patient left: in bed;with family/visitor present;with call bell/phone within reach;with bed alarm set;with nursing/sitter in room Nurse Communication: Mobility status PT Visit Diagnosis: Other abnormalities of gait and mobility (R26.89);Muscle weakness (generalized) (M62.81);Other symptoms and signs involving the nervous system (R29.898);Pain Pain - Right/Left: Right Pain - part of body: Hip     Time: 0321-2248 PT Time Calculation (min) (ACUTE ONLY): 23 min  Charges:  $Therapeutic Exercise: 23-37 mins                     Olga Coaster PT, DPT 2:45 PM,08/05/20

## 2020-08-05 NOTE — Progress Notes (Signed)
[facility] Progress Note  Patient: Kristen Osborn    GYJ:[id number]  DOA: 07/31/2020     Date of Service: the patient was seen and examined on 08/05/2020  Chief Complaint  Patient presents with  . Hip Pain    right   Brief hospital course: [place] a 87 y.o.femalewith medical history significant forprimary hypertension, peripheral artery disease, atrial fibrillation on Eliquis, diastolic heart failure, Alzheimer dementia, status post permanent pacemaker, presents to the emergency department from home due to witnessed mechanical fall.    Work-up revealed periprosthetic fracture of the right femur.  Orthopedic surgery consulted, surgical intervention delayed due to recent use of DOAC, Eliquis, which is on hold.  Hospital course complicated by acute urinary retention for which a Foley catheter was placed on 08/01/2020.  Will attempt a voiding trial a day after her surgery.  Also complicated by acute blood loss with hemoglobin dropped down to 6.5, she is currently post 2 unit PRBC transfusion.  Post right hip repair on 08/03/2020 by Dr. Odis Luster.   Assessment and Plan: Principal Problem:   Closed right hip fracture (HCC) Active Problems:   Alzheimer's dementia without behavioral disturbance (HCC)   Atrial fibrillation and flutter (HCC)   Benign essential HTN   PAD (peripheral artery disease) (HCC)   Status post placement of cardiac pacemaker   Chronic anticoagulation   Closed displaced fracture of right femoral neck (HCC)   Protein-calorie malnutrition, severe   Closed right hip fracture, initial encounter (HCC)  POD #2 post right hip repair from closed right hip fracture post mechanical fall Oblique displaced perihardware fracture about the femoral component of the right hip total arthroplasty seen on x-ray. Right hip repair on 08/03/2020 by Dr. Odis Luster.  Recommended to follow-up in 2 weeks as an outpatient. Continue analgesics. PT OT has assessed and  recommended home health PT OT. TOC consulted to assist with home health services  Permanent A. Fib, on Eliquis PTA Okay to restart Eliquis per orthopedic surgery. She is rate controlled on p.o. Lopressor 25 mg every 6 hours Continue to closely monitor on telemetry. IV Lopressor as needed with parameters.  Acute blood loss anemia post 2 unit PRBC transfusion. She received 2 unit PRBC transfusion for hemoglobin of 6.5 on 08/02/2020. Hemoglobin stable post surgery 9.8 from 9.3.  Leukocytosis likely reactive in the setting of orthopedic surgery Afebrile Repeat CBC in the morning  Vitamin D3 deficiency Vitamin D3 level and indicated will deficiency, 26. Continue vitamin D3 replacement.  Improved chronic hyponatremia on salt tablets prior to admission Serum sodium 131 from 127 She is currently asymptomatic. Continue home salt tablets Monitor sodium level  Resolving AKI, likely prerenal in the setting of dehydration, poor oral intake Baseline creatinine appears to be 0.5 with GFR greater than 60 Presented with creatinine of 0.99 with GFR 54. Creatinine is downtrending 0.7 Continue gentle IV fluid hydration normal saline at 50 cc/h x 1 day.  Acute urinary retention Unable to void morning of 08/02/2020. Greater than 300 cc urine retained from bladder scan Discontinue Foley and voiding trial today 08/05/2020. If she fails voiding trial will replace Foley catheter and refer to urology for voiding trial in the clinic.    Hyperglycemia with no known history of diabetes Presented withSerum glucose 187 could be secondary to stress hemoglobin A1c 5.2, within normal range We will start insulin sliding scale as needed  Chronic normocytic anemia Stable as stated above.  Physical debility PT OT evaluation done, recommended SNF placement but patient's  daughter want to take her home due to sundowning dementia and behavioral changes during SNF stay in the past  Continue fall  precautions.  Per her daughter she was recently treated for urinary tract infection at outside facility Home oral antibiotics on hold She was started on Rocephin empirically Urine culture negative to date Will DC antibiotics after removal of Foley catheter possibly on 08/05/2020.   Body mass index is 17.61 kg/m.  Nutrition Problem: Increased nutrient needs Etiology: post-op healing Interventions: Interventions: Ensure Enlive (each supplement provides 350kcal and 20 grams of protein),MVI     Diet: soft DVT Prophylaxis: Therapeutic Anticoagulation with Eliquis   Advance goals of care discussion: DNR  Family Communication: family was present at bedside, at the time of interview.  The pt provided permission to discuss medical plan with the family. Opportunity was given to ask question and all questions were answered satisfactorily.   Disposition:  Pt is from Home, admitted with right hip fracture, anemia, UTI, urine retention, still has Foley catheter due to urinary retention, which precludes a safe discharge. Discharge to home, tomorrow a.m., we will discontinue Foley catheter today to follow voiding trial.  No BM since past 5 days, continue laxatives and plan for DC tomorrow.  Subjective: No overnight issues, patient's pain is under control, resting comfortably.  Denies any active issues, no abdominal pain, no nausea vomiting or diarrhea.  Denies any chest pain or palpitations.   Physical Exam: General:  Alert, NAD.  Appear in no distress, affect appropriate Eyes: PERRLA ENT: Oral Mucosa Clear, moist  Neck: no JVD,  Cardiovascular: S1 and S2 Present, no Murmur,  Respiratory: good respiratory effort, Bilateral Air entry equal and Decreased, no Crackles, no wheezes Abdomen: Bowel Sound present, Soft and no tenderness,  Skin: no rashes  Extremities: no Pedal edema, no calf tenderness, right knee cast intact and left forearm cast intact Neurologic: without any new focal  findings Gait not checked due to patient safety concerns  Vitals:   08/05/20 0525 08/05/20 0814 08/05/20 1150 08/05/20 1356  BP: (!) 162/92 135/72 (!) 155/120 (!) 135/116  Pulse: 82 87 66   Resp: 16 18 16    Temp: 97.6 F (36.4 C) 97.6 F (36.4 C) (!) 97.3 F (36.3 C)   TempSrc: Oral     SpO2: 94% 94% 98%   Weight:      Height:        Intake/Output Summary (Last 24 hours) at 08/05/2020 1545 Last data filed at 08/05/2020 1428 Gross per 24 hour  Intake 120 ml  Output 800 ml  Net -680 ml   Filed Weights   08/03/20 0900 08/04/20 0400 08/04/20 1020  Weight: 48.4 kg 46.9 kg 49.5 kg    Data Reviewed: I have personally reviewed and interpreted daily labs, tele strips, imagings as discussed above. I reviewed all nursing notes, pharmacy notes, vitals, pertinent old records I have discussed plan of care as described above with RN and patient/family.  CBC: Recent Labs  Lab 07/31/20 1450 08/01/20 0811 08/02/20 0616 08/02/20 1944 08/02/20 2330 08/03/20 0701 08/04/20 0618 08/05/20 0451  WBC 9.6   < > 8.9  --  9.6 10.0 12.3* 10.9*  NEUTROABS 6.1  --   --   --   --   --   --   --   HGB 10.6*   < > 6.5* 9.5* 9.6* 9.3* 9.8* 9.0*  HCT 33.0*   < > 20.0*  --  28.6* 28.0* 28.9* 26.4*  MCV 90.9   < >  89.7  --  87.7 87.8 86.8 86.8  PLT 239   < > 138*  --  140* 135* 213 197   < > = values in this interval not displayed.   Basic Metabolic Panel: Recent Labs  Lab 08/01/20 0747 08/02/20 0616 08/02/20 2330 08/03/20 0701 08/04/20 0618 08/05/20 0451  NA 127* 128* 132* 130* 130* 131*  K 4.7 4.0 3.9 3.8 3.9 3.4*  CL 94* 96* 99 98 100 100  CO2 25 24 24 25 23 25   GLUCOSE 130* 140* 152* 123* 136* 130*  BUN 32* 28* 28* 24* 27* 30*  CREATININE 0.84 0.83 0.57 0.61 0.71 0.56  CALCIUM 8.9 8.2* 8.1* 8.3* 8.2* 8.2*  MG 1.8 1.9 1.8  --   --  1.9  1.9  PHOS  --  3.4 3.0  --   --  2.6  2.6    Studies: No results found.  Scheduled Meds: . apixaban  2.5 mg Oral BID  . Chlorhexidine  Gluconate Cloth  6 each Topical Daily  . cholecalciferol  1,000 Units Oral Daily  . docusate  100 mg Oral BID  . feeding supplement  237 mL Oral TID BM  . ferrous sulfate  325 mg Oral Q breakfast  . losartan  25 mg Oral Daily  . metoprolol tartrate  25 mg Oral Q6H  . multivitamin with minerals  1 tablet Oral Daily  . pantoprazole  40 mg Oral Daily  . polyethylene glycol  17 g Oral BID  . potassium & sodium phosphates  2 packet Oral TID AC & HS  . QUEtiapine  25 mg Oral QHS  . sodium chloride  1 g Oral Daily  . vitamin B-12  1,000 mcg Oral Daily   Continuous Infusions: . sodium chloride 50 mL/hr at 08/05/20 0648   PRN Meds: bisacodyl, bisacodyl, HYDROcodone-acetaminophen, metoCLOPramide **OR** metoCLOPramide (REGLAN) injection, metoprolol tartrate, ondansetron (ZOFRAN) IV, ondansetron **OR** ondansetron (ZOFRAN) IV  Time spent: 35 minutes  Author: 08/07/20. MD [facility] 08/05/2020 3:45 PM  To reach On-call, see care teams to locate the attending and reach out to them via [email or web address]. If 7PM-7AM, please contact night-coverage If you still have difficulty reaching the attending provider, please page the [facility] (Director on Call) for [facility] on amion for assistance.

## 2020-08-05 NOTE — Op Note (Signed)
08/03/2020  11:39 AM  PATIENT:  Kristen Osborn    PRE-OPERATIVE DIAGNOSIS:  Right Periprosthetic Femur Fracture  POST-OPERATIVE DIAGNOSIS:  Same  PROCEDURE:  OPEN REDUCTION INTERNAL FIXATION (ORIF) PERIPROSTHETIC FEMUR FRACTURE, RIGHT  SURGEON:  Lyndle Herrlich, MD   ASSIST: Altamese Cabal, PA-C  ANESTHESIA: spinal, local  PREOPERATIVE INDICATIONS:  Kristen Osborn is a  87 y.o. female with a diagnosis of Right Periprosthetic Femur Fracture who elected for surgical management after extensive discussion of  the risks benefits and alternatives  with the patient preoperatively including but not limited to the risks of infection, bleeding, nerve injury, cardiopulmonary complications, the need for revision surgery, among others, and the patient was willing to proceed.  EBL: 200 cc  OPERATIVE IMPLANTS: Synthes PLATE CRV LCP 17 HOLE [id number]  OPERATIVE FINDINGS: Comminuted, displaced, [place] B1 periprosthetic hip fracture, right  OPERATIVE PROCEDURE: The patient was brought to the operating room and underwent satisfactory anesthesia and was placed in the supine position on the HANA table with the operative leg in traction and the contralateral in a well leg holder.  The operative leg was prepped and draped in sterile fashion.  A longitudinal lateral incision was made over the length of the femur with dissection carried out sharply through fascia down to bone.  The vastus lateralis was lifted anteriorly to expose the femur.  Irrigation was used. The fracture fragments were manipulated into the best possible position due to the comminution.  A Synthes 17 hole periprosthetic femur plate was aligned along the femur.  It was temporarily fixated with a large bone holding clamp.  The central screw was placed through the  plate to bring the plate down to the bone.  The femur was reduced and the distal locking screws were placed. Four synthes cables were placed around the proximal femur.  Fluoroscopy  showed overall good alignment on AP and lateral views with minimal recurvatum. A separate lateral incision was made and three locking screws were placed distally.  Fluoroscopy showed good alignment on AP and lateral views. Wound was again irrigated and closed with #2 Quill on fascia, 2-0 Vicryl on subcutaneous tissue and staples on all skin areas.  A sterile dressing was applied.  Sponge and needle count was correct.  Soft long-leg dressing and knee immobilizer were applied.  The patient was awakened and taken to recovery in good condition.   Cassell Smiles, MD

## 2020-08-05 NOTE — Progress Notes (Signed)
Nutrition Follow-up  DOCUMENTATION CODES:   Severe malnutrition in context of chronic illness,Underweight  INTERVENTION:   -Continue Ensure Enlive po TID, each supplement provides 350 kcal and 20 grams of protein -Continue MVI with minerals daily -Magic cup TID with meals, each supplement provides 290 kcal and 9 grams of protein  NUTRITION DIAGNOSIS:   Severe Malnutrition related to chronic illness (dementia) as evidenced by severe fat depletion,severe muscle depletion.  Ongoing  GOAL:   Patient will meet greater than or equal to 90% of their needs  Progressing   MONITOR:   PO intake,Supplement acceptance,Labs,Weight trends,Skin,I & O's  REASON FOR ASSESSMENT:   Consult Assessment of nutrition requirement/status,Hip fracture protocol  ASSESSMENT:   Kristen Osborn is a 87 y.o. female who presents to the emergency department today with primary complaint of right hip pain.  3/30- s/p PROCEDURE:  OPEN REDUCTION INTERNAL FIXATION (ORIF) PERIPROSTHETIC FEMUR FRACTURE, RIGHT  Reviewed I/O's: +635 ml x 24 hours and +741 ml since admission  UOP: 625 ml x 24 hours  Spoke with pt and daughter at bedside. Per daughter, pt's intake has improved since admission. She consumed about 25% of breakfast. PTA pt consumes 3 meals per day, but pt "eats like a bird". She usually consumes about one Ensure supplement daily.   Reviewed wt hx; pt has experienced a 39% wt loss over the past 8 months, which is significant for time frame.   Discussed importance of good meal and supplement intake to promote healing. They are amenable to Ensure and Magic Cups.   Medications reviewed and include vitamin D3, colace, miralax, vitamin B-12, and 0.9% sodium chloride infusion @ 50 ml/hr.    Labs reviewed: Na: 131.   NUTRITION - FOCUSED PHYSICAL EXAM:  Flowsheet Row Most Recent Value  Orbital Region Severe depletion  Upper Arm Region Severe depletion  Thoracic and Lumbar Region Severe depletion   Buccal Region Severe depletion  Clavicle Bone Region Severe depletion  Clavicle and Acromion Bone Region Severe depletion  Scapular Bone Region Severe depletion  Dorsal Hand Severe depletion  Patellar Region Severe depletion  Anterior Thigh Region Severe depletion  Posterior Calf Region Severe depletion  Edema (RD Assessment) None  Hair Reviewed  Eyes Reviewed  Mouth Reviewed  Skin Reviewed  Nails Reviewed       Diet Order:   Diet Order            DIET SOFT Room service appropriate? Yes; Fluid consistency: Thin  Diet effective now                 EDUCATION NEEDS:   Education needs have been addressed  Skin:  Skin Assessment: Skin Integrity Issues: Skin Integrity Issues:: Incisions Incisions: closed rt leg  Last BM:  Unknown  Height:   Ht Readings from Last 1 Encounters:  08/03/20 5\' 6"  (1.676 m)    Weight:   Wt Readings from Last 1 Encounters:  08/04/20 49.5 kg    Ideal Body Weight:  59.1 kg  BMI:  Body mass index is 17.61 kg/m.  Estimated Nutritional Needs:   Kcal:  1400-1600  Protein:  60-75 grams  Fluid:  > 1.4 L    08/06/20, RD, LDN, CDCES Registered Dietitian II Certified Diabetes Care and Education Specialist Please refer to [facility] for RD and/or RD on-call/weekend/after hours pager

## 2020-08-05 NOTE — Progress Notes (Addendum)
Physical Therapy Treatment Patient Details Name: Kristen Osborn MRN: [id number] DOB: [birth year] Today's Date: 08/05/2020    History of Present Illness Pt is a 87 yo female s/p ORIF of periprosthetic R hip fx. Hospital stay complicated by urinary retention and low hemoglobin, s/p 2 transfusions, episodes of afib with RVR.PMH of CHF, HTN, afib, pacemaker, dementia.    PT Comments    Patient alert, family in room. Pt premedicated prior to session. Pt exhibited improved tolerance compared to previous sessions as well as decreased pain. Several supine exercises performed with tactile and verbal cues. Supine <> sit with minA and extended time. Sit <> Stand three times, maxA with RW and cues for sequencing/hand placement ea attempt. MaxA to come up into standing and PT held pt R leg to maintain NWB precautions. A hop was attempted, but pt was unable to clear LLE from floor. Returned to supine and quarter turned for skin protection, nursing student and lab in room at end of session. Pt quickly fell asleep due to fatigue.  The patient would benefit from further skilled PT intervention to continue to progress towards goals. Recommendation remains appropriate.     Follow Up Recommendations  Home health PT;Supervision/Assistance - 24 hour     Equipment Recommendations  Hospital bed    Recommendations for Other Services       Precautions / Restrictions Precautions Precautions: Fall Required Braces or Orthoses: Splint/Cast;Knee Immobilizer - Right Knee Immobilizer - Right: On at all times (off for hygiene and dressing changes only) Splint/Cast: Splint for R distal radius fx (weight bear through elbow only); per family, cast originally planned to be removed 3/29, however present during evaluation Restrictions Weight Bearing Restrictions: Yes RLE Weight Bearing: Non weight bearing Other Position/Activity Restrictions: per Ortho's note, pt OK for TDWB, but still encourage NWB. also per MD knee  immobilizer only to be removed for hygiene purposes and dressing changes.    Mobility  Bed Mobility Overal bed mobility: Needs Assistance Bed Mobility: Supine to Sit;Sit to Supine Rolling: Mod assist   Supine to sit: Min assist;HOB elevated Sit to supine: Min assist        Transfers Overall transfer level: Needs assistance Equipment used: Rolling walker (2 wheeled) Transfers: Sit to/from Stand Sit to Stand: Max assist         General transfer comment: PT maintained NWB precautions with maxA from PT (PT holding RLE off of ground)  Ambulation/Gait                 Stairs             Wheelchair Mobility    Modified Rankin (Stroke Patients Only)       Balance Overall balance assessment: Needs assistance Sitting-balance support: Single extremity supported;Feet supported Sitting balance-Leahy Scale: Fair Sitting balance - Comments: Fair sitting balance at EOB     Standing balance-Leahy Scale: Poor                              Cognition Arousal/Alertness: Awake/alert Behavior During Therapy: WFL for tasks assessed/performed Overall Cognitive Status: History of cognitive impairments - at baseline                                 General Comments: Pt A&Ox1 (is aware that she is not at home). Required frequent cues to re-orient pt to situation and frequent multi-modal cues  for sequencing tasks      Exercises General Exercises - Lower Extremity Ankle Circles/Pumps: AROM;Both;10 reps Heel Slides: Strengthening;10 reps;AROM;Left;5 reps;Right;AAROM Hip ABduction/ADduction: AROM;Strengthening;Left;10 reps;AAROM;Right Other Exercises Other Exercises: sit <> stand exercise performed three times during session. MaxA with RW and PT maintaining NWB precautions by holding onto patient's leg. Cued for hand placement and sequencing ea time, family provided CGA assist as well.    General Comments        Pertinent Vitals/Pain Pain  Assessment: Faces Faces Pain Scale: Hurts little more Pain Location: R hip with doffing/donning knee immobilizer Pain Descriptors / Indicators: Moaning;Guarding;Grimacing Pain Intervention(s): Limited activity within patient's tolerance;Monitored during session;Premedicated before session;Repositioned    Home Living                      Prior Function            PT Goals (current goals can now be found in the care plan section) Progress towards PT goals: Progressing toward goals    Frequency    BID      PT Plan Current plan remains appropriate    Co-evaluation              AM-PAC PT "6 Clicks" Mobility   Outcome Measure  Help needed turning from your back to your side while in a flat bed without using bedrails?: A Lot Help needed moving from lying on your back to sitting on the side of a flat bed without using bedrails?: A Lot Help needed moving to and from a bed to a chair (including a wheelchair)?: Total Help needed standing up from a chair using your arms (e.g., wheelchair or bedside chair)?: Total Help needed to walk in hospital room?: Total Help needed climbing 3-5 steps with a railing? : Total 6 Click Score: 8    End of Session Equipment Utilized During Treatment: Gait belt Activity Tolerance: Patient tolerated treatment well Patient left: in bed;with family/visitor present;with call bell/phone within reach;with bed alarm set;with nursing/sitter in room Nurse Communication: Mobility status PT Visit Diagnosis: Other abnormalities of gait and mobility (R26.89);Muscle weakness (generalized) (M62.81);Other symptoms and signs involving the nervous system (R29.898);Pain Pain - Right/Left: Right Pain - part of body: Hip     Time: 5573-2202 PT Time Calculation (min) (ACUTE ONLY): 43 min  Charges:  $Therapeutic Exercise: 38-52 mins                     Olga Coaster PT, DPT 12:05 PM,08/05/20

## 2020-08-05 NOTE — Progress Notes (Signed)
Subjective:  Patient reports pain as mild to moderate.   Pleasantly confused. Her daughter is in the room.   Objective:   VITALS:   Vitals:   08/04/20 1555 08/04/20 2125 08/05/20 0111 08/05/20 0525  BP: 138/80 (!) 170/126 140/60 (!) 162/92  Pulse: (!) 101 97 (!) 103 82  Resp: 16 18  16   Temp: 98.6 F (37 C) 98.4 F (36.9 C) 98.2 F (36.8 C) 97.6 F (36.4 C)  TempSrc:  Axillary Oral Oral  SpO2: 95% 97% 96% 94%  Weight:      Height:        PHYSICAL EXAM:  Neurologically intact ABD soft Neurovascular intact Sensation intact distally Intact pulses distally Dorsiflexion/Plantar flexion intact Incision: moderate drainage and dressing changed No cellulitis present Compartment soft  LABS  Results for orders placed or performed during the hospital encounter of 07/31/20 (from the past 24 hour(s))  CBC     Status: Abnormal   Collection Time: 08/05/20  4:51 AM  Result Value Ref Range   WBC 10.9 (H) 4.0 - 10.5 K/uL   RBC 3.04 (L) 3.87 - 5.11 MIL/uL   Hemoglobin 9.0 (L) 12.0 - 15.0 g/dL   HCT 08/07/20 (L) 40.9 - 81.1 %   MCV 86.8 80.0 - 100.0 fL   MCH 29.6 26.0 - 34.0 pg   MCHC 34.1 30.0 - 36.0 g/dL   RDW 91.4 (H) 78.2 - 95.6 %   Platelets 197 150 - 400 K/uL   nRBC 0.0 0.0 - 0.2 %  Basic metabolic panel     Status: Abnormal   Collection Time: 08/05/20  4:51 AM  Result Value Ref Range   Sodium 131 (L) 135 - 145 mmol/L   Potassium 3.4 (L) 3.5 - 5.1 mmol/L   Chloride 100 98 - 111 mmol/L   CO2 25 22 - 32 mmol/L   Glucose, Bld 130 (H) 70 - 99 mg/dL   BUN 30 (H) 8 - 23 mg/dL   Creatinine, Ser 08/07/20 0.44 - 1.00 mg/dL   Calcium 8.2 (L) 8.9 - 10.3 mg/dL   GFR, Estimated 0.86 >57 mL/min   Anion gap 6 5 - 15  Magnesium     Status: None   Collection Time: 08/05/20  4:51 AM  Result Value Ref Range   Magnesium 1.9 1.7 - 2.4 mg/dL  Phosphorus     Status: None   Collection Time: 08/05/20  4:51 AM  Result Value Ref Range   Phosphorus 2.6 2.5 - 4.6 mg/dL    DG HIP OPERATIVE  UNILAT W OR W/O PELVIS RIGHT  Result Date: 08/03/2020 CLINICAL DATA:  Right hip ORIF. EXAM: OPERATIVE RIGHT HIP (WITH PELVIS IF PERFORMED) TECHNIQUE: Fluoroscopic spot image(s) were submitted for interpretation post-operatively. COMPARISON:  Preoperative radiographs 01/01/2012 FINDINGS: Five fluoroscopic spot views of the right proximal femur obtained in the operating room. Lateral plate, screws, and cerclage wire fixation of periprosthetic 3 proximal femur fracture. Fluoroscopy time not provided. IMPRESSION: Procedural fluoroscopy during ORIF periprosthetic femur fracture. Electronically Signed   By: 01/03/2012 M.D.   On: 08/03/2020 20:21    Assessment/Plan: 2 Days Post-Op   Principal Problem:   Closed right hip fracture (HCC) Active Problems:   Alzheimer's dementia without behavioral disturbance (HCC)   Atrial fibrillation and flutter (HCC)   Benign essential HTN   PAD (peripheral artery disease) (HCC)   Status post placement of cardiac pacemaker   Chronic anticoagulation   Closed displaced fracture of right femoral neck (HCC)   Protein-calorie malnutrition, severe  Closed right hip fracture, initial encounter (HCC)   Advance diet Up with therapy  Encourage NWB but TDWB is OK as well Discharge home with home health  Continue cast treatment distal radius until seen in the office for repeat x-rays and staple removal Follow up in 2 weeks April 11 call office for appointment [phone] Hemoglobin stable Continue Hydrocodone for pain relief (rx sent to pharmacy)    Altamese Cabal , PA-C 08/05/2020, 6:18 AM

## 2020-08-05 NOTE — Progress Notes (Addendum)
Occupational Therapy Treatment Patient Details Name: Kristen Osborn MRN: [id number] DOB: [birth year] Today's Date: 08/05/2020    History of present illness Pt is a 87 yo female s/p ORIF of periprosthetic R hip fx. Hospital stay complicated by urinary retention and low hemoglobin, s/p 2 transfusions, episodes of afib with RVR.PMH of CHF, HTN, afib, pacemaker, dementia.   OT comments  Pt seen for OT treatment on this date. Upon arrival to room, pt asleep in bed however easily awoken and agreeable to OT tx. Pt's daughter at bedside. This date, pt required MOD A for bed mobility, SUPERVISION/SET-UP for seated grooming tasks at EOB, and MAX A+2 for stand pivot transfer to/from [facility] (with OT providing MAX multi-modal cues for sequencing and body positioning to maintain weightbearing precaution). Pt is making good progress toward goals and continues to benefit from skilled OT services to maximize return to PLOF and minimize risk of future falls, injury, caregiver burden, and readmission. Will continue to follow POC. Discharge recommendation remains appropriate.    Follow Up Recommendations  Home health OT;Supervision/Assistance - 24 hour    Equipment Recommendations  Hospital bed       Precautions / Restrictions Precautions Precautions: Fall Required Braces or Orthoses: Splint/Cast;Knee Immobilizer - Right Knee Immobilizer - Right: On at all times (off for hygiene and dressing changes only) Splint/Cast: Splint for R distal radius fx (weight bear through elbow only); per family, cast originally planned to be removed 3/29, however present during evaluation Restrictions Weight Bearing Restrictions: Yes RLE Weight Bearing: Non weight bearing Other Position/Activity Restrictions: per Ortho's note, pt OK for TDWB, but still encourage NWB. also per MD knee immobilizer only to be removed for hygiene purposes and dressing changes.       Mobility Bed Mobility Overal bed mobility: Needs Assistance Bed  Mobility: Supine to Sit;Sit to Supine     Supine to sit: Mod assist;HOB elevated Sit to supine: Mod assist        Transfers Overall transfer level: Needs assistance Equipment used: Rolling walker (2 wheeled) Transfers: Stand Pivot Transfers Stand pivot transfers: Max assist;+2 physical assistance         Balance Overall balance assessment: Needs assistance Sitting-balance support: No upper extremity supported;Feet supported Sitting balance-Leahy Scale: Fair Sitting balance - Comments: Fair sitting balance at EOB     Standing balance-Leahy Scale: Zero Standing balance comment: MAX A to maintain balance during stand pivot tranfer to [facility]                           ADL either performed or assessed with clinical judgement   ADL Overall ADL's : Needs assistance/impaired     Grooming: Wash/dry hands;Supervision/safety;Set up;Sitting                   Toilet Transfer: Maximal assistance;+2 for physical assistance;BSC Toilet Transfer Details (indicate cue type and reason): MAX verbal cues for sequencing and adhering to weightbearing precautions Toileting- Clothing Manipulation and Hygiene: Maximal assistance;Sit to/from stand                         Cognition Arousal/Alertness: Awake/alert Behavior During Therapy: WFL for tasks assessed/performed Overall Cognitive Status: History of cognitive impairments - at baseline                                 General Comments: Required frequent cues to  re-orient pt to situation and frequent multi-modal cues for sequencing tasks                   Pertinent Vitals/ Pain       Pain Assessment: Faces Faces Pain Scale: Hurts even more Pain Location: R hip with mobility Pain Descriptors / Indicators: Moaning;Guarding;Grimacing Pain Intervention(s): Limited activity within patient's tolerance;Monitored during session;Repositioned         Frequency  Min 1X/week        Progress  Toward Goals  OT Goals(current goals can now be found in the care plan section)  Progress towards OT goals: Progressing toward goals  Acute Rehab OT Goals Patient Stated Goal: to go home OT Goal Formulation: With patient/family Time For Goal Achievement: 08/18/20 Potential to Achieve Goals: Good  Plan Discharge plan remains appropriate;Frequency remains appropriate       AM-PAC OT "6 Clicks" Daily Activity     Outcome Measure   Help from another person eating meals?: None Help from another person taking care of personal grooming?: A Little Help from another person toileting, which includes using toliet, bedpan, or urinal?: A Lot Help from another person bathing (including washing, rinsing, drying)?: A Lot Help from another person to put on and taking off regular upper body clothing?: A Little Help from another person to put on and taking off regular lower body clothing?: A Lot 6 Click Score: 16    End of Session Equipment Utilized During Treatment: Gait belt;Rolling walker  OT Visit Diagnosis: Unsteadiness on feet (R26.81);Muscle weakness (generalized) (M62.81);Repeated falls (R29.6)   Activity Tolerance Patient tolerated treatment well   Patient Left in bed;with call bell/phone within reach;with bed alarm set;with family/visitor present   Nurse Communication Mobility status        Time: 3557-3220 OT Time Calculation (min): 38 min  Charges: OT General Charges $OT Visit: 1 Visit OT Treatments $Self Care/Home Management : 38-52 mins  Matthew Folks, OTR/L [facility] [phone]

## 2020-08-05 NOTE — TOC Progression Note (Signed)
Transition of Care [facility]) - Progression Note    Patient Details  Name: Kristen Osborn MRN: [id number] Date of Birth: 09-08-1926  [facility] of [facility]) CM/SW Contact  Caryn Section, RN Phone Number: 08/05/2020, 2:58 PM  Clinical Narrative:   TOC in to see patient and daughter Kristen Osborn at bedside.  Patient oriented to self and place, tired at present.  Purpose of visit:  Discharge planning.  SNF recommendation presented, daughter states there are 2 other daughters in the family, and they are planning to take care of their mother at home.  They have no concerns about getting patient to appointments or getting medications, as they all support the patient.They were not happy with previous SNF stays.  [facility], [place] notified, can take patient if discharged tomorrow.  TOC contact information given, patient and daughter have no further questions at this time.    Expected Discharge Plan: Home w Home Health Services Barriers to Discharge: Continued Medical Work up  Expected Discharge Plan and Services Expected Discharge Plan: Home w Home Health Services In-house Referral: Clinical Social Work   Post Acute Care Choice: Home Health Living arrangements for the past 2 months: Single Family Home                           HH Arranged: Speech Therapy,PT,OT HH Agency: [facility] Date HH Agency Contacted: 08/04/20 Time HH Agency Contacted: 1100 Representative spoke with at [facility] Agency: [place]   Social Determinants of Health (SDOH) Interventions    Readmission Risk Interventions Readmission Risk Prevention Plan 08/05/2020 08/04/2020  Transportation Screening Complete Complete  PCP or Specialist Appt within 3-5 Days Complete Complete  HRI or Home Care Consult Complete Complete  Social Work Consult for Recovery Care Planning/Counseling Complete Complete  Palliative Care Screening Not Applicable Not Applicable  Medication Review [employer]) Complete Complete  Some recent data  might be hidden

## 2020-08-05 NOTE — Progress Notes (Signed)
Foley removed per order, without complications. External purwick now in place.

## 2020-08-06 LAB — BPAM RBC
Blood Product Expiration Date: 202204272359
Blood Product Expiration Date: 202204272359
Blood Product Expiration Date: 202204272359
Blood Product Expiration Date: 202204272359
ISSUE DATE / TIME: 202203271041
ISSUE DATE / TIME: 202203271416
ISSUE DATE / TIME: 202203281816
ISSUE DATE / TIME: 202203281816
Unit Type and Rh: 5100
Unit Type and Rh: 5100
Unit Type and Rh: 5100
Unit Type and Rh: 5100

## 2020-08-06 LAB — TYPE AND SCREEN
ABO/RH(D): O POS
Antibody Screen: NEGATIVE
Unit division: 0
Unit division: 0
Unit division: 0
Unit division: 0

## 2020-08-06 MED ORDER — FERROUS SULFATE 325 (65 FE) MG PO TABS: 325.0000 mg | ORAL_TABLET | Freq: Every day | ORAL | 3 refills | Status: DC

## 2020-08-06 MED ORDER — METOPROLOL TARTRATE 25 MG PO TABS: 25.0000 mg | ORAL_TABLET | Freq: Four times a day (QID) | ORAL | Status: DC

## 2020-08-06 MED ORDER — VITAMIN D 125 MCG (5000 UT) PO CAPS: 1.0000 | ORAL_CAPSULE | Freq: Every day | ORAL | 0 refills | Status: DC

## 2020-08-06 MED ORDER — SODIUM CHLORIDE 1 G PO TABS
1.0000 g | ORAL_TABLET | Freq: Two times a day (BID) | ORAL | Status: AC
Start: 2020-08-06 — End: 2020-08-20

## 2020-08-06 MED ORDER — LOSARTAN POTASSIUM 25 MG PO TABS: 25.0000 mg | ORAL_TABLET | Freq: Every day | ORAL | Status: DC

## 2020-08-06 MED ORDER — BISACODYL 5 MG PO TBEC: 10.0000 mg | DELAYED_RELEASE_TABLET | Freq: Every day | ORAL | 0 refills | Status: DC | PRN

## 2020-08-06 NOTE — Discharge Summary (Signed)
[facility] Discharge Summary   Patient: Kristen MaclachlanVirginia W Osborn NWG:[id number]  PCP: Danella PentonMiller, Mark F, MD  Date of admission: 07/31/2020   Date of discharge:  08/06/2020     Discharge Diagnoses:  Principal Problem:   Closed right hip fracture [facility](HCC) Active Problems:   Alzheimer's dementia without behavioral disturbance (HCC)   Atrial fibrillation and flutter (HCC)   Benign essential HTN   PAD (peripheral artery disease) (HCC)   Status post placement of cardiac pacemaker   Chronic anticoagulation   Closed displaced fracture of right femoral neck (HCC)   Protein-calorie malnutrition, severe   Closed right hip fracture, initial encounter (HCC)   Admitted From: Home Disposition:  Home with HHPT  Recommendations for Outpatient Follow-up:  1. PCP: in 1 wk 2. F/u ortho in 2 wks 3. Follow up LABS/TEST:     Diet recommendation: Cardiac diet  Activity: The patient is advised to gradually reintroduce usual activities, as tolerated  Discharge Condition: stable  Code Status: DNR   History of present illness: As per the H and P dictated on admission Hospital Course:  Kristen Osborn a 87 y.o.femalewith medical history significant forprimary hypertension, peripheral artery disease, atrial fibrillation on Eliquis, diastolic heart failure, Alzheimer dementia, status post permanent pacemaker, presents to the emergency department from home due to witnessed mechanical fall.  Work-up revealed periprosthetic fracture of the right femur. Orthopedic surgery consulted, surgical intervention delayed due to recent use of DOAC, Eliquis, which is on hold. Hospital course complicated by acute urinary retention for which a Foley catheter was placed on 08/01/2020. Will attempt a voiding trial a day after her surgery. Also complicated by acute blood loss with hemoglobin dropped down to 6.5, she is currently post 2 unit PRBC transfusion. Post right hip repair on 08/03/2020 by Dr. Odis LusterBowers POD #2 post right  hip repair from closed right hip fracture post mechanical fall Oblique displaced perihardware fracture about the femoral component of the right hip total arthroplasty seen on x-ray. Right hip repair on 08/03/2020 by Dr.Bowers.  Recommended to follow-up in 2 weeks as an outpatient. Continue analgesics. PT OT has assessed and recommended home health PT OT. TOC consulted to assist with home health services Permanent A. Fib, on Eliquis PTA, Okay to restart Eliquis per orthopedic surgery. She is rate controlled on p.o. Lopressor 25 mg every 6 hours.  Follow-up with PCP. Acute blood loss anemia post 2 unit PRBC transfusion. She received 2 unit PRBC transfusion for hemoglobin of 6.5 on 08/02/2020. Hemoglobin stable post surgery 9.8 from 9.3. Leukocytosis likely reactive in the setting of orthopedic surgery Afebrile, most likely reactive leukocytosis.  Follow with PCP and repeat CBC after 1 week  Vitamin D3 deficiency, Vitamin D3 level and indicated will deficiency, 26. Continue vitamin D3 replacement. Improved chronic hyponatremia on salt tablets prior to admission Serum sodium 17031from 127--131, She is currently asymptomatic. Continue home salt tablets. F/u pcp to Monitor sodium level Resolving AKI, likely prerenal in the setting of dehydration, poor oral intake Baseline creatinine appears to be 0.5 with GFR greater than 60, Presented with creatinine of 0.99 with GFR 54. Creatinine is downtrending 0.56 on dc.  S/p gentle IV fluid hydration normal saline at 50 cc/h x 1 day.  Continue oral hydration Acute urinary retention, pt was Unable to void morning of 08/02/2020. Greater than 300 cc urine retained from bladder scan. Discontinue Foley and passed voiding trial on  08/05/2020. Hyperglycemia with no known history of diabetes. Presented withSerum glucose 187 could be secondary to  stress. hemoglobin A1c 5.2, within normal range. S/p insulin sliding scale as needed during hospital stay.  Diabetic diet and  follow with PCP. Chronic normocytic anemia, Stable as stated above. Physical debility, PT OT evaluation done, recommended SNF placement but patient's daughter want to take her home due to sundowning dementia and behavioral changes during SNF stay in the past. Continue fall precautions. Per her daughter she was recently treated for urinary tract infection at outside facility, Home oral antibiotics on hold. She was started on Rocephin empirically Urine culture negative to date. DC antibiotics after removal of Foley catheteron 08/05/2020. Body mass index is 17.61 kg/m.  Nutrition Problem: Increased nutrient needs Etiology: post-op healing Interventions: Interventions: Ensure Enlive (each supplement provides 350kcal and 20 grams of protein),MVI   Patient was seen by physical therapy, who recommended [facility] but family refused due to possibility of agitation due to dementia, so Home health  was arranged. On the day of the discharge the patient's vitals were stable, and no other acute medical condition were reported by patient. the patient was felt safe to be discharge at Home with Home health.  Consultants: Orthopedics Procedures: Right hip ORIF done on 07/26/2020  Discharge Exam: General: Appear in no distress, no Rash; Oral Mucosa Clear, moist. Cardiovascular: S1 and S2 Present, no Murmur, Respiratory: normal respiratory effort, Bilateral Air entry present and no Crackles, no wheezes Abdomen: Bowel Sound present, Soft and no tenderness, no hernia Extremities: no Pedal edema, no calf tenderness Neurology: alert and NAD, no focal deficit affect appropriate.  Filed Weights   08/03/20 0900 08/04/20 0400 08/04/20 1020  Weight: 48.4 kg 46.9 kg 49.5 kg   Vitals:   08/06/20 0400 08/06/20 0816  BP: 108/64 (!) 144/76  Pulse:  81  Resp:  17  Temp:  98.4 F (36.9 C)  SpO2:  93%    DISCHARGE MEDICATION: Allergies as of 08/06/2020      Reactions   Lisinopril Other (See Comments)       Medication List    STOP taking these medications   cefUROXime 250 MG tablet Commonly known as: CEFTIN   Cyanocobalamin 1000 MCG Lozg     TAKE these medications   apixaban 2.5 MG Tabs tablet Commonly known as: ELIQUIS Take 1 tablet (2.5 mg total) by mouth 2 (two) times daily.   bisacodyl 5 MG EC tablet Commonly known as: DULCOLAX Take 2 tablets (10 mg total) by mouth daily as needed for moderate constipation.   docusate sodium 100 MG capsule Commonly known as: COLACE Take 1 capsule (100 mg total) by mouth 2 (two) times daily.   feeding supplement Liqd Take 237 mLs by mouth 3 (three) times daily between meals.   ferrous sulfate 325 (65 FE) MG tablet Take 1 tablet (325 mg total) by mouth daily with breakfast.   HYDROcodone-acetaminophen 5-325 MG tablet Commonly known as: NORCO/VICODIN Take 1 tablet by mouth every 4 (four) hours as needed for moderate pain. What changed:   how much to take  when to take this   lidocaine 5 % Commonly known as: LIDODERM Place 1 patch onto the skin daily. Remove & Discard patch within 12 hours or as directed by MD   liver oil-zinc oxide 40 % ointment Commonly known as: DESITIN Apply topically as needed for irritation.   losartan 25 MG tablet Commonly known as: COZAAR Take 1 tablet (25 mg total) by mouth daily. Skip dose if SBP <120 mmHg What changed: additional instructions   metoprolol tartrate 25 MG tablet Commonly  known as: LOPRESSOR Take 1 tablet (25 mg total) by mouth every 6 (six) hours. Skip dose if SBP <110 mmHg What changed: additional instructions   multivitamin with minerals Tabs tablet Take 1 tablet by mouth daily.   omeprazole 20 MG capsule Commonly known as: PRILOSEC Take 20 mg by mouth daily.   polyethylene glycol 17 g packet Commonly known as: MIRALAX / GLYCOLAX Take 17 g by mouth daily as needed for mild constipation or moderate constipation.   potassium chloride 10 MEQ tablet Commonly known as:  KLOR-CON Take 10 mEq by mouth daily.   QUEtiapine 25 MG tablet Commonly known as: SEROQUEL Take 25 mg by mouth daily. Takes at 5 pm   sodium chloride 1 g tablet Take 1 tablet (1 g total) by mouth 2 (two) times daily with a meal for 14 days. What changed: when to take this   Vitamin D 125 MCG (5000 UT) Caps Take 1 capsule by mouth daily.            Durable Medical Equipment  (From admission, onward)         Start     Ordered   08/04/20 1733  For home use only DME Hospital bed  Once       Question Answer Comment  Length of Need 12 Months   The above medical condition requires: Patient requires the ability to reposition frequently   Head must be elevated greater than: 30 degrees   Bed type Semi-electric   Hoyer Lift Yes      08/04/20 1732           Discharge Care Instructions  (From admission, onward)         Start     Ordered   08/06/20 0000  Discharge wound care:       Comments: As per ortho and f/u for dressing change   08/06/20 1232         Allergies  Allergen Reactions  . Lisinopril Other (See Comments)   Discharge Instructions    Diet - low sodium heart healthy   Complete by: As directed    Discharge instructions   Complete by: As directed    F/u with PCP in 1-2 wks  F/u Ortho in 2 wks   Discharge wound care:   Complete by: As directed    As per ortho and f/u for dressing change   Increase activity slowly   Complete by: As directed       The results of significant diagnostics from this hospitalization (including imaging, microbiology, ancillary and laboratory) are listed below for reference.    Significant Diagnostic Studies: DG Chest 1 View  Result Date: 07/31/2020 CLINICAL DATA:  Tripped and fell on right hip, recent right hip surgery EXAM: CHEST  1 VIEW COMPARISON:  06/24/2020 FINDINGS: 2 frontal views of the chest demonstrate stable single lead pacer. Cardiac silhouette is enlarged. No airspace disease, effusion, or pneumothorax.  There are no acute displaced fractures. IMPRESSION: 1. No acute intrathoracic process. Electronically Signed   By: Sharlet Salina M.D.   On: 07/31/2020 15:41   DG Pelvis 1-2 Views  Result Date: 07/31/2020 CLINICAL DATA:  Tripped and fell, recent right hip surgery EXAM: PELVIS - 1-2 VIEW COMPARISON:  06/28/2020 FINDINGS: Single frontal view of the pelvis was performed, including both hips. Right hip hemiarthroplasty is partially identified. There is a periprosthetic fracture involving the femur, only partially included on this exam. Please refer to dedicated right femur series for complete characterization  and visualization. The remainder of the bony pelvis is unremarkable. The left hip is well aligned, with mild osteoarthritis. IMPRESSION: 1. Partial visualization of a periprosthetic fracture of the right femur. Please refer to dedicated right femur evaluation. 2. Otherwise unremarkable bony pelvis. Electronically Signed   By: Sharlet Salina M.D.   On: 07/31/2020 15:46   DG HIP OPERATIVE UNILAT W OR W/O PELVIS RIGHT  Result Date: 08/03/2020 CLINICAL DATA:  Right hip ORIF. EXAM: OPERATIVE RIGHT HIP (WITH PELVIS IF PERFORMED) TECHNIQUE: Fluoroscopic spot image(s) were submitted for interpretation post-operatively. COMPARISON:  Preoperative radiographs 01/01/2012 FINDINGS: Five fluoroscopic spot views of the right proximal femur obtained in the operating room. Lateral plate, screws, and cerclage wire fixation of periprosthetic 3 proximal femur fracture. Fluoroscopy time not provided. IMPRESSION: Procedural fluoroscopy during ORIF periprosthetic femur fracture. Electronically Signed   By: Narda Rutherford M.D.   On: 08/03/2020 20:21   DG FEMUR, MIN 2 VIEWS RIGHT  Result Date: 07/31/2020 CLINICAL DATA:  Fall over curb, right hip pain EXAM: RIGHT FEMUR 2 VIEWS COMPARISON:  06/28/2020 FINDINGS: There is an an oblique, displaced perihardware fracture about the femoral component of a right hip total arthroplasty.  Osteopenia. No displaced fracture of the included pelvis. IMPRESSION: There is an oblique, displaced perihardware fracture about the femoral component of a right hip total arthroplasty. Electronically Signed   By: Lauralyn Primes M.D.   On: 07/31/2020 15:33    Microbiology: Recent Results (from the past 240 hour(s))  Resp Panel by RT-PCR (Flu A&B, Covid) Nasopharyngeal Swab     Status: None   Collection Time: 07/31/20  4:20 PM   Specimen: Nasopharyngeal Swab; Nasopharyngeal(NP) swabs in vial transport medium  Result Value Ref Range Status   SARS Coronavirus 2 by RT PCR NEGATIVE NEGATIVE Final    Comment: (NOTE) SARS-CoV-2 target nucleic acids are NOT DETECTED.  The SARS-CoV-2 RNA is generally detectable in upper respiratory specimens during the acute phase of infection. The lowest concentration of SARS-CoV-2 viral copies this assay can detect is 138 copies/mL. A negative result does not preclude SARS-Cov-2 infection and should not be used as the sole basis for treatment or other patient management decisions. A negative result may occur with  improper specimen collection/handling, submission of specimen other than nasopharyngeal swab, presence of viral mutation(s) within the areas targeted by this assay, and inadequate number of viral copies(<138 copies/mL). A negative result must be combined with clinical observations, patient history, and epidemiological information. The expected result is Negative.  Fact Sheet for Patients:  BloggerCourse.com  Fact Sheet for Healthcare Providers:  SeriousBroker.it  This test is no t yet approved or cleared by the [place] FDA and  has been authorized for detection and/or diagnosis of SARS-CoV-2 by FDA under an Emergency Use Authorization (EUA). This EUA will remain  in effect (meaning this test can be used) for the duration of the COVID-19 declaration under Section 564(b)(1) of the Act,  21 U.S.C.section 360bbb-3(b)(1), unless the authorization is terminated  or revoked sooner.       Influenza A by PCR NEGATIVE NEGATIVE Final   Influenza B by PCR NEGATIVE NEGATIVE Final    Comment: (NOTE) The Xpert Xpress SARS-CoV-2/FLU/RSV plus assay is intended as an aid in the diagnosis of influenza from Nasopharyngeal swab specimens and should not be used as a sole basis for treatment. Nasal washings and aspirates are unacceptable for Xpert Xpress SARS-CoV-2/FLU/RSV testing.  Fact Sheet for Patients: BloggerCourse.com  Fact Sheet for Healthcare Providers: SeriousBroker.it  This test  is not yet approved or cleared by the [place] and has been authorized for detection and/or diagnosis of SARS-CoV-2 by FDA under an Emergency Use Authorization (EUA). This EUA will remain in effect (meaning this test can be used) for the duration of the COVID-19 declaration under Section 564(b)(1) of the Act, 21 U.S.C. section 360bbb-3(b)(1), unless the authorization is terminated or revoked.  Performed at [facility], [address], [place], [place] [address]   Urine Culture     Status: None   Collection Time: 08/01/20 12:15 PM   Specimen: Urine, Random  Result Value Ref Range Status   Specimen Description   Final    URINE, RANDOM Performed at [facility], [address] [place], [place] [address]    Special Requests   Final    NONE Performed at [facility] [address], [place], [place] [address]    Culture   Final    NO GROWTH Performed at [facility], [address], [place], [place] [address]    Report Status 08/03/2020 FINAL  Final     Labs: CBC: Recent Labs  Lab 07/31/20 1450 08/01/20 0811 08/02/20 0616 08/02/20 1944 08/02/20 2330 08/03/20 0701 08/04/20 0618 08/05/20 0451  WBC 9.6   < > 8.9  --  9.6 10.0 12.3* 10.9*  NEUTROABS 6.1  --   --   --   --   --   --    --   HGB 10.6*   < > 6.5* 9.5* 9.6* 9.3* 9.8* 9.0*  HCT 33.0*   < > 20.0*  --  28.6* 28.0* 28.9* 26.4*  MCV 90.9   < > 89.7  --  87.7 87.8 86.8 86.8  PLT 239   < > 138*  --  140* 135* 213 197   < > = values in this interval not displayed.   Basic Metabolic Panel: Recent Labs  Lab 08/01/20 0747 08/02/20 0616 08/02/20 2330 08/03/20 0701 08/04/20 0618 08/05/20 0451  NA 127* 128* 132* 130* 130* 131*  K 4.7 4.0 3.9 3.8 3.9 3.4*  CL 94* 96* 99 98 100 100  CO2 25 24 24 25 23 25   GLUCOSE 130* 140* 152* 123* 136* 130*  BUN 32* 28* 28* 24* 27* 30*  CREATININE 0.84 0.83 0.57 0.61 0.71 0.56  CALCIUM 8.9 8.2* 8.1* 8.3* 8.2* 8.2*  MG 1.8 1.9 1.8  --   --  1.9  1.9  PHOS  --  3.4 3.0  --   --  2.6  2.6   Liver Function Tests: No results for input(s): AST, ALT, ALKPHOS, BILITOT, PROT, ALBUMIN in the last 168 hours. No results for input(s): LIPASE, AMYLASE in the last 168 hours. No results for input(s): AMMONIA in the last 168 hours. Cardiac Enzymes: Recent Labs  Lab 07/31/20 1450 08/01/20 0747  CKTOTAL 106 86   BNP (last 3 results) No results for input(s): BNP in the last 8760 hours. CBG: No results for input(s): GLUCAP in the last 168 hours.  Time spent: 35 minutes  Signed:  08/03/20  [facility]  08/06/2020 12:32 PM

## 2020-08-06 NOTE — TOC Transition Note (Signed)
Transition of Care [facility]) - CM/SW Discharge Note   Patient Details  Name: Kristen Osborn MRN: [id number] Date of Birth: June 29, 1926  Transition of Care [facility]) CM/SW Contact:  Caryn Section, RN Phone Number: 08/06/2020, 3:20 PM   Clinical Narrative:  TOC in to see patient, daughter at bedside re: discharge planning.  Patient to be discharged home, daughters will care for patient at home.  Hospital bed ordered through [employer], daughter confirmed that it has been delivered to patient's home and set up.  [facility] contacted, will transport patient at 1600.  Daughters have no further questions at this time.       Final next level of care: Home w Home Health Services Barriers to Discharge: Continued Medical Work up   Patient Goals and CMS Choice Patient states their goals for this hospitalization and ongoing recovery are:: Per Daughter, [name] patient to return home.   Choice offered to / list presented to : NA  Discharge Placement                Patient to be transferred to facility by: Ambulance Name of family member notified: daughters [name] and [name] Patient and family notified of of transfer: 08/06/20  Discharge Plan and Services In-house Referral: Clinical Social Work   Post Acute Care Choice: Home Health          DME Arranged: Hospital bed   Date DME Agency Contacted: 08/06/20 Time DME Agency Contacted: 1300 Representative spoke with at DME Agency: Elease Hashimoto [facility] Arranged: Speech Therapy,PT,OT HH Agency: [facility] Date [facility] Agency Contacted: 08/04/20 Time HH Agency Contacted: 1100 Representative spoke with at [facility] Agency: [place]  Social Determinants of Health (SDOH) Interventions     Readmission Risk Interventions Readmission Risk Prevention Plan 08/05/2020 08/04/2020  Transportation Screening Complete Complete  PCP or Specialist Appt within 3-5 Days Complete Complete  HRI or Home Care Consult Complete Complete  Social Work Consult for Recovery Care  Planning/Counseling Complete Complete  Palliative Care Screening Not Applicable Not Applicable  Medication Review [employer]) Complete Complete  Some recent data might be hidden

## 2020-08-06 NOTE — Progress Notes (Signed)
Pt discharged home by EMS. Went over discharge instructions with pts daughter and she showed understanding and had no questions at that time. Took IV out of left upper arm without complications. Taken home by EMS.

## 2020-08-06 NOTE — Progress Notes (Signed)
Physical Therapy Treatment Patient Details Name: Kristen Osborn MRN: [id number] DOB: Oct 19, 1926 Today's Date: 08/06/2020    History of Present Illness Pt is a 87 yo female s/p ORIF of periprosthetic R hip fx. Hospital stay complicated by urinary retention and low hemoglobin, s/p 2 transfusions, episodes of afib with RVR.PMH of CHF, HTN, afib, pacemaker, dementia.    PT Comments    Patient alert, agreeable to PT, oriented to self only. Exhibited moderate pain signs/symptoms with standing attempts, premedicated prior to PT session. The patient continued to demonstrate great motivation to participate, but remains very limited in her ability to stand and unable to ambulate. Bed mobility performed minA, fair balanced noted sitting EOB. Sit <> stand performed twice, modA with RW and platform attachment to come into standing, maxA to maintain NWB precautions on RLE. Pt with improved balance on second attempt once up in standing and able to maintain balance with minA ~15 seconds. Returned to supine and repositioned with all needs in reach. PT and family reviewed repositioning at home (and pt quarter turned once returned to supine), administered gait belt and exercise program, all questions answered. The patient would benefit from further skilled PT intervention to continue to progress towards goals. Recommendation remains appropriate.       Follow Up Recommendations  Home health PT;Supervision/Assistance - 24 hour     Equipment Recommendations  Hospital bed    Recommendations for Other Services       Precautions / Restrictions Precautions Precautions: Fall Knee Immobilizer - Right: On at all times (off for hygiene and dressing changes only) Splint/Cast: Splint for R distal radius fx (weight bear through elbow only); per family, cast originally planned to be removed 3/29, however present during evaluation Restrictions Weight Bearing Restrictions: Yes RLE Weight Bearing: Non weight  bearing Other Position/Activity Restrictions: per Ortho's note, pt OK for TDWB, but still encourage NWB.    Mobility  Bed Mobility Overal bed mobility: Needs Assistance Bed Mobility: Supine to Sit;Sit to Supine Rolling: Mod assist   Supine to sit: Min assist;HOB elevated Sit to supine: Min assist        Transfers Overall transfer level: Needs assistance Equipment used: Rolling walker (2 wheeled) Transfers: Sit to/from Stand Sit to Stand: Mod assist         General transfer comment: PT maintained NWB precautions with maxA from PT (PT holding RLE off of ground). performed twice. Pt almost able to hop on LLE, but did make pt anxious  Ambulation/Gait             General Gait Details: unable   Stairs             Wheelchair Mobility    Modified Rankin (Stroke Patients Only)       Balance Overall balance assessment: Needs assistance Sitting-balance support: No upper extremity supported;Feet supported Sitting balance-Leahy Scale: Fair Sitting balance - Comments: Fair sitting balance at EOB     Standing balance-Leahy Scale: Poor Standing balance comment: MaxA to maintain NWB precautions                            Cognition Arousal/Alertness: Awake/alert Behavior During Therapy: WFL for tasks assessed/performed Overall Cognitive Status: History of cognitive impairments - at baseline                                 General Comments: pleasant and agreeable  throughout      Exercises General Exercises - Lower Extremity Ankle Circles/Pumps: AROM;Both;10 reps Heel Slides: Strengthening;10 reps;AROM;Left;AAROM Hip ABduction/ADduction: AROM;Strengthening;Left;10 reps;AAROM;Right Other Exercises Other Exercises: sit <> Stand twice with RW and platform attachment. PT and family also reviewed repositioning at home and pillow use to help prevent skin break down, administered exercise packet, gait belt given.    General Comments         Pertinent Vitals/Pain Pain Assessment: Faces Faces Pain Scale: Hurts little more Pain Location: R hip with mobility Pain Descriptors / Indicators: Moaning;Guarding;Grimacing Pain Intervention(s): Limited activity within patient's tolerance;Monitored during session;Repositioned    Home Living                      Prior Function            PT Goals (current goals can now be found in the care plan section) Progress towards PT goals: Progressing toward goals    Frequency    BID      PT Plan Current plan remains appropriate    Co-evaluation              AM-PAC PT "6 Clicks" Mobility   Outcome Measure  Help needed turning from your back to your side while in a flat bed without using bedrails?: A Little Help needed moving from lying on your back to sitting on the side of a flat bed without using bedrails?: A Little Help needed moving to and from a bed to a chair (including a wheelchair)?: A Lot Help needed standing up from a chair using your arms (e.g., wheelchair or bedside chair)?: A Lot Help needed to walk in hospital room?: Total Help needed climbing 3-5 steps with a railing? : Total 6 Click Score: 12    End of Session Equipment Utilized During Treatment: Gait belt Activity Tolerance: Patient tolerated treatment well Patient left: in bed;with family/visitor present;with call bell/phone within reach;with bed alarm set;with nursing/sitter in room Nurse Communication: Mobility status PT Visit Diagnosis: Other abnormalities of gait and mobility (R26.89);Muscle weakness (generalized) (M62.81);Other symptoms and signs involving the nervous system (R29.898);Pain Pain - Right/Left: Right Pain - part of body: Hip     Time: 1041-1106 PT Time Calculation (min) (ACUTE ONLY): 25 min  Charges:  $Therapeutic Exercise: 23-37 mins                     Olga Coaster PT, DPT 12:00 PM,08/06/20

## 2020-08-06 NOTE — Progress Notes (Signed)
  Subjective:  Patient reports pain as mild.    Objective:   VITALS:   Vitals:   08/05/20 2136 08/05/20 2306 08/06/20 0324 08/06/20 0400  BP:  (!) 93/46 116/68 108/64  Pulse: (!) 101 90 99   Resp:  18    Temp:  98.1 F (36.7 C)    TempSrc:      SpO2:  93%    Weight:      Height:        PHYSICAL EXAM:  Neurologically intact ABD soft Neurovascular intact Sensation intact distally Intact pulses distally Dorsiflexion/Plantar flexion intact Incision: dressing C/D/I No cellulitis present Compartment soft  LABS  Results for orders placed or performed during the hospital encounter of 07/31/20 (from the past 24 hour(s))  Vitamin B12     Status: Abnormal   Collection Time: 08/05/20  9:32 AM  Result Value Ref Range   Vitamin B-12 1,510 (H) 180 - 914 pg/mL  VITAMIN D 25 Hydroxy (Vit-D Deficiency, Fractures)     Status: Abnormal   Collection Time: 08/05/20  9:32 AM  Result Value Ref Range   Vit D, 25-Hydroxy 29.10 (L) 30 - 100 ng/mL    No results found.  Assessment/Plan: 3 Days Post-Op   Principal Problem:   Closed right hip fracture (HCC) Active Problems:   Alzheimer's dementia without behavioral disturbance (HCC)   Atrial fibrillation and flutter (HCC)   Benign essential HTN   PAD (peripheral artery disease) (HCC)   Status post placement of cardiac pacemaker   Chronic anticoagulation   Closed displaced fracture of right femoral neck (HCC)   Protein-calorie malnutrition, severe   Closed right hip fracture, initial encounter (HCC)   Advance diet Up with therapy  Encourage NWB but TDWB is OK as well Discharge home with home health Continue cast treatment distal radius until seen in the office for repeat x-rays and staple removal Follow up in 2 weeks April 11 call office for appointment [phone] Hemoglobin stable Continue Hydrocodone for pain relief (rx sent to pharmacy)  Altamese Cabal , PA-C 08/06/2020, 7:27 AM

## 2020-08-07 LAB — PREPARE RBC (CROSSMATCH)

## 2020-08-08 ENCOUNTER — Encounter: Payer: Self-pay | Admitting: Emergency Medicine

## 2020-08-08 ENCOUNTER — Emergency Department
Admission: EM | Admit: 2020-08-08 | Discharge: 2020-08-08 | Disposition: A | Discharge status: Home / Self Care | Payer: Medicare Other | Attending: Emergency Medicine | Admitting: Emergency Medicine

## 2020-08-08 ENCOUNTER — Other Ambulatory Visit: Payer: Self-pay

## 2020-08-08 DIAGNOSIS — Z96641 Presence of right artificial hip joint: Secondary | ICD-10-CM | POA: Insufficient documentation

## 2020-08-08 DIAGNOSIS — R350 Frequency of micturition: Secondary | ICD-10-CM | POA: Insufficient documentation

## 2020-08-08 DIAGNOSIS — Z4889 Encounter for other specified surgical aftercare: Secondary | ICD-10-CM

## 2020-08-08 DIAGNOSIS — R339 Retention of urine, unspecified: Secondary | ICD-10-CM

## 2020-08-08 DIAGNOSIS — F0390 Unspecified dementia without behavioral disturbance: Secondary | ICD-10-CM | POA: Insufficient documentation

## 2020-08-08 DIAGNOSIS — I4891 Unspecified atrial fibrillation: Secondary | ICD-10-CM | POA: Diagnosis not present

## 2020-08-08 DIAGNOSIS — I110 Hypertensive heart disease with heart failure: Secondary | ICD-10-CM | POA: Insufficient documentation

## 2020-08-08 DIAGNOSIS — Z79899 Other long term (current) drug therapy: Secondary | ICD-10-CM | POA: Insufficient documentation

## 2020-08-08 DIAGNOSIS — I509 Heart failure, unspecified: Secondary | ICD-10-CM | POA: Diagnosis not present

## 2020-08-08 LAB — URINALYSIS, COMPLETE (UACMP) WITH MICROSCOPIC
Bacteria, UA: NONE SEEN
Bilirubin Urine: NEGATIVE
Glucose, UA: NEGATIVE mg/dL
Hgb urine dipstick: NEGATIVE
Ketones, ur: NEGATIVE mg/dL
Leukocytes,Ua: NEGATIVE
Nitrite: NEGATIVE
Protein, ur: NEGATIVE mg/dL
Specific Gravity, Urine: 1.018 (ref 1.005–1.030)
pH: 5 (ref 5.0–8.0)

## 2020-08-08 LAB — COMPREHENSIVE METABOLIC PANEL
ALT: 45 U/L — ABNORMAL HIGH (ref 0–44)
AST: 81 U/L — ABNORMAL HIGH (ref 15–41)
Albumin: 2.8 g/dL — ABNORMAL LOW (ref 3.5–5.0)
Alkaline Phosphatase: 115 U/L (ref 38–126)
Anion gap: 6 (ref 5–15)
BUN: 24 mg/dL — ABNORMAL HIGH (ref 8–23)
CO2: 28 mmol/L (ref 22–32)
Calcium: 8.3 mg/dL — ABNORMAL LOW (ref 8.9–10.3)
Chloride: 99 mmol/L (ref 98–111)
Creatinine, Ser: 0.5 mg/dL (ref 0.44–1.00)
GFR, Estimated: >60 mL/min (ref >60)
Glucose, Bld: 118 mg/dL — ABNORMAL HIGH (ref 70–99)
Potassium: 4.3 mmol/L (ref 3.5–5.1)
Sodium: 133 mmol/L — ABNORMAL LOW (ref 135–145)
Total Bilirubin: 2.1 mg/dL — ABNORMAL HIGH (ref 0.3–1.2)
Total Protein: 5.8 g/dL — ABNORMAL LOW (ref 6.5–8.1)

## 2020-08-08 LAB — CBC
HCT: 30.3 % — ABNORMAL LOW (ref 36.0–46.0)
Hemoglobin: 9.9 g/dL — ABNORMAL LOW (ref 12.0–15.0)
MCH: 29.1 pg (ref 26.0–34.0)
MCHC: 32.7 g/dL (ref 30.0–36.0)
MCV: 89.1 fL (ref 80.0–100.0)
Platelets: 351 10*3/uL (ref 150–400)
RBC: 3.4 MIL/uL — ABNORMAL LOW (ref 3.87–5.11)
RDW: 15.3 % (ref 11.5–15.5)
WBC: 8.9 10*3/uL (ref 4.0–10.5)
nRBC: 0 % (ref 0.0–0.2)

## 2020-08-08 MED ORDER — METOPROLOL TARTRATE 25 MG PO TABS
25.0000 mg | ORAL_TABLET | Freq: Once | ORAL | Status: AC
  Administered 2020-08-08: 25 mg via ORAL
  Filled 2020-08-08: qty 1

## 2020-08-08 NOTE — ED Notes (Signed)
Called [facility] for transport to [phone] [place]

## 2020-08-08 NOTE — ED Provider Notes (Signed)
[facility] Emergency Department Provider Note    Event Date/Time   First MD Initiated Contact with Patient 08/08/20 1519     (approximate)  I have reviewed the triage vital signs and the nursing notes.   HISTORY  Chief Complaint Urinary frequency  Level V Caveat:  Dementia  HPI Kristen Osborn is a 87 y.o. female the below listed past medical history presents to the ER for evaluation of increased urinary frequency and concern for urinary retention as well as for pulling off of her postop bandage from her femur repair.  No other complaints.  No recent falls.  Just got released from rehab.  Patient denies any pain.  No other concerns per family.    Past Medical History:  Diagnosis Date  . Atrial fibrillation (HCC)   . CHF (congestive heart failure) (HCC)   . Dementia (HCC)    No family history on file. Past Surgical History:  Procedure Laterality Date  . ABDOMINAL HYSTERECTOMY    . CLOSED REDUCTION WRIST FRACTURE Right 06/28/2020   Procedure: CLOSED REDUCTION WRIST;  Surgeon: Lyndle Herrlich, MD;  Location: [facility] ORS;  Service: Orthopedics;  Laterality: Right;  . HIP ARTHROPLASTY Right 06/28/2020   Procedure: ARTHROPLASTY BIPOLAR HIP (HEMIARTHROPLASTY);  Surgeon: Lyndle Herrlich, MD;  Location: [facility] ORS;  Service: Orthopedics;  Laterality: Right;  . ORIF PERIPROSTHETIC FRACTURE Right 08/03/2020   Procedure: OPEN REDUCTION INTERNAL FIXATION (ORIF) PERIPROSTHETIC FRACTURE;  Surgeon: Lyndle Herrlich, MD;  Location: [facility] ORS;  Service: Orthopedics;  Laterality: Right;   Patient Active Problem List   Diagnosis Date Noted  . Closed right hip fracture, initial encounter (HCC) 08/01/2020  . Closed left hip fracture (HCC) 07/31/2020  . Protein-calorie malnutrition, severe 06/26/2020  . Chronic anticoagulation 06/25/2020  . Closed displaced fracture of right femoral neck (HCC) 06/25/2020  . Preoperative clearance 06/25/2020  . Distal radius fracture, right  06/25/2020  . Closed right hip fracture (HCC) 06/25/2020  . PAD (peripheral artery disease) (HCC) 10/17/2018  . Status post placement of cardiac pacemaker 10/17/2018  . Alzheimer's dementia without behavioral disturbance (HCC) 04/17/2018  . Atrial fibrillation and flutter (HCC) 04/17/2018  . Benign essential HTN 03/20/2014      Prior to Admission medications   Medication Sig Start Date End Date Taking? Authorizing Provider  apixaban (ELIQUIS) 2.5 MG TABS tablet Take 1 tablet (2.5 mg total) by mouth 2 (two) times daily. 07/06/20   Arnetha Courser, MD  bisacodyl (DULCOLAX) 5 MG EC tablet Take 2 tablets (10 mg total) by mouth daily as needed for moderate constipation. 08/06/20   Gillis Santa, MD  Cholecalciferol (VITAMIN D) 125 MCG (5000 UT) CAPS Take 1 capsule by mouth daily. 08/06/20   Gillis Santa, MD  docusate sodium (COLACE) 100 MG capsule Take 1 capsule (100 mg total) by mouth 2 (two) times daily. 07/06/20   Arnetha Courser, MD  feeding supplement (ENSURE ENLIVE / ENSURE PLUS) LIQD Take 237 mLs by mouth 3 (three) times daily between meals. 07/06/20   Arnetha Courser, MD  ferrous sulfate 325 (65 FE) MG tablet Take 1 tablet (325 mg total) by mouth daily with breakfast. 08/06/20   Gillis Santa, MD  HYDROcodone-acetaminophen (NORCO/VICODIN) 5-325 MG tablet Take 1 tablet by mouth every 4 (four) hours as needed for moderate pain. 08/05/20   Altamese Cabal, PA-C  lidocaine (LIDODERM) 5 % Place 1 patch onto the skin daily. Remove & Discard patch within 12 hours or as directed by MD Patient not taking: No  sig reported 07/06/20   Arnetha Courser, MD  liver oil-zinc oxide (DESITIN) 40 % ointment Apply topically as needed for irritation. 07/06/20   Arnetha Courser, MD  losartan (COZAAR) 25 MG tablet Take 1 tablet (25 mg total) by mouth daily. Skip dose if SBP <120 mmHg 08/06/20   Gillis Santa, MD  metoprolol tartrate (LOPRESSOR) 25 MG tablet Take 1 tablet (25 mg total) by mouth every 6 (six) hours. Skip dose if SBP  <110 mmHg 08/06/20   Gillis Santa, MD  Multiple Vitamin (MULTIVITAMIN WITH MINERALS) TABS tablet Take 1 tablet by mouth daily. 07/06/20   Arnetha Courser, MD  omeprazole (PRILOSEC) 20 MG capsule Take 20 mg by mouth daily. 04/17/20   [provider]  polyethylene glycol (MIRALAX / GLYCOLAX) 17 g packet Take 17 g by mouth daily as needed for mild constipation or moderate constipation. 07/06/20   Arnetha Courser, MD  potassium chloride (KLOR-CON) 10 MEQ tablet Take 10 mEq by mouth daily.    [provider]  QUEtiapine (SEROQUEL) 25 MG tablet Take 25 mg by mouth daily. Takes at 5 pm 06/16/20   [provider]  sodium chloride 1 g tablet Take 1 tablet (1 g total) by mouth 2 (two) times daily with a meal for 14 days. 08/06/20 08/20/20  Gillis Santa, MD    Allergies Lisinopril    Social History Social History   Tobacco Use  . Smoking status: Never Smoker  . Smokeless tobacco: Never Used  Vaping Use  . Vaping Use: Never used  Substance Use Topics  . Alcohol use: Not Currently  . Drug use: Never    Review of Systems Patient denies headaches, rhinorrhea, blurry vision, numbness, shortness of breath, chest pain, edema, cough, abdominal pain, nausea, vomiting, diarrhea, dysuria, fevers, rashes or hallucinations unless otherwise stated above in HPI. ____________________________________________   PHYSICAL EXAM:  VITAL SIGNS: Vitals:   08/08/20 1600 08/08/20 1630  BP: (!) 162/99 136/77  Pulse: 96 (!) 112  Resp: 17 17  Temp:    SpO2: 95% 97%    Constitutional: Alert, pleasant, cooperative  Eyes: Conjunctivae are normal.  Head: Atraumatic. Nose: No congestion/rhinnorhea. Mouth/Throat: Mucous membranes are moist.   Neck: No stridor. Painless ROM.  Cardiovascular: Normal rate, regular rhythm. Grossly normal heart sounds.  Good peripheral circulation. Respiratory: Normal respiratory effort.  No retractions. Lungs CTAB. Gastrointestinal: Soft and nontender. No  distention. No abdominal bruits. No CVA tenderness. Genitourinary:normal external genitalia Musculoskeletal: Right thigh compartment is soft.  Surgical incision appears clean dry and intact.  No overlying erythema or warmth.  No deformity.  No joint effusions. Neurologic:  Normal speech and language. No gross focal neurologic deficits are appreciated. No facial droop Skin:  Skin is warm, dry and intact. No rash noted. Psychiatric: calm and cooperative  ____________________________________________   LABS (all labs ordered are listed, but only abnormal results are displayed)  Results for orders placed or performed during the hospital encounter of 08/08/20 (from the past 24 hour(s))  Urinalysis, Complete w Microscopic Urine, Catheterized     Status: Abnormal   Collection Time: 08/08/20  3:38 PM  Result Value Ref Range   Color, Urine YELLOW (A) YELLOW   APPearance CLEAR (A) CLEAR   Specific Gravity, Urine 1.018 1.005 - 1.030   pH 5.0 5.0 - 8.0   Glucose, UA NEGATIVE NEGATIVE mg/dL   Hgb urine dipstick NEGATIVE NEGATIVE   Bilirubin Urine NEGATIVE NEGATIVE   Ketones, ur NEGATIVE NEGATIVE mg/dL   Protein, ur NEGATIVE NEGATIVE mg/dL  Nitrite NEGATIVE NEGATIVE   Leukocytes,Ua NEGATIVE NEGATIVE   RBC / HPF 0-5 0 - 5 RBC/hpf   WBC, UA 0-5 0 - 5 WBC/hpf   Bacteria, UA NONE SEEN NONE SEEN   Squamous Epithelial / LPF 0-5 0 - 5  CBC     Status: Abnormal   Collection Time: 08/08/20  4:17 PM  Result Value Ref Range   WBC 8.9 4.0 - 10.5 K/uL   RBC 3.40 (L) 3.87 - 5.11 MIL/uL   Hemoglobin 9.9 (L) 12.0 - 15.0 g/dL   HCT 91.430.3 (L) 78.236.0 - 95.646.0 %   MCV 89.1 80.0 - 100.0 fL   MCH 29.1 26.0 - 34.0 pg   MCHC 32.7 30.0 - 36.0 g/dL   RDW 21.315.3 08.611.5 - 57.815.5 %   Platelets 351 150 - 400 K/uL   nRBC 0.0 0.0 - 0.2 %  Comprehensive metabolic panel     Status: Abnormal   Collection Time: 08/08/20  4:17 PM  Result Value Ref Range   Sodium 133 (L) 135 - 145 mmol/L   Potassium 4.3 3.5 - 5.1 mmol/L    Chloride 99 98 - 111 mmol/L   CO2 28 22 - 32 mmol/L   Glucose, Bld 118 (H) 70 - 99 mg/dL   BUN 24 (H) 8 - 23 mg/dL   Creatinine, Ser 4.690.50 0.44 - 1.00 mg/dL   Calcium 8.3 (L) 8.9 - 10.3 mg/dL   Total Protein 5.8 (L) 6.5 - 8.1 g/dL   Albumin 2.8 (L) 3.5 - 5.0 g/dL   AST 81 (H) 15 - 41 U/L   ALT 45 (H) 0 - 44 U/L   Alkaline Phosphatase 115 38 - 126 U/L   Total Bilirubin 2.1 (H) 0.3 - 1.2 mg/dL   GFR, Estimated >62>60 >95>60 mL/min   Anion gap 6 5 - 15   ____________________________________________ ____________________________________________  RADIOLOGY   ____________________________________________   PROCEDURES  Procedure(s) performed:  Procedures    Critical Care performed: no ____________________________________________   INITIAL IMPRESSION / ASSESSMENT AND PLAN / ED COURSE  Pertinent labs & imaging results that were available during my care of the patient were reviewed by me and considered in my medical decision making (see chart for details).   DDX: UTI, AKI, electrolyte abnormality, anemia  Kristen Osborn is a 87 y.o. who presents to the ED with presentation as described above.  Patient nontoxic-appearing no acute distress.  Does not appear to be having signs of acute urinary retention.  No recent falls or trauma.  Wound appears well-healing.  Will redress with appropriate postop dressing.  Urinalysis without any evidence of infection.  Clinical Course as of 08/08/20 1731  Sat Aug 08, 2020  1706 Patient's bladder scan did show that she is retaining urine and did have to have Foley catheter placed during previous hospitalization.  Did not follow-up with urology after she pulled out the catheter on her own.  Based on concern for recurrent bladder dysfunction and retention, will replace Foley.  Blood work otherwise reassuring.  Family agreeable to plan. [PR]    Clinical Course User Index [PR] Willy Eddyobinson, Cindi Ghazarian, MD    The patient was evaluated in Emergency Department today  for the symptoms described in the history of present illness. He/she was evaluated in the context of the global COVID-19 pandemic, which necessitated consideration that the patient might be at risk for infection with the SARS-CoV-2 virus that causes COVID-19. Institutional protocols and algorithms that pertain to the evaluation of patients at risk for COVID-19  are in a state of rapid change based on information released by regulatory bodies including the CDC and federal and state organizations. These policies and algorithms were followed during the patient's care in the ED.  As part of my medical decision making, I reviewed the following data within the electronic MEDICAL RECORD NUMBER Nursing notes reviewed and incorporated, Labs reviewed, notes from prior ED visits and [place] Controlled Substance Database   ____________________________________________   FINAL CLINICAL IMPRESSION(S) / ED DIAGNOSES  Final diagnoses:  Encounter for post surgical wound check  Urinary retention      NEW MEDICATIONS STARTED DURING THIS VISIT:  New Prescriptions   No medications on file     Note:  This document was prepared using Dragon voice recognition software and may include unintentional dictation errors.    Willy Eddy, MD 08/08/20 [phone]

## 2020-08-08 NOTE — ED Triage Notes (Signed)
Pt in via EMS from home. EMS reports pt recently admitted for multiple falls and a broken femur. Pt had surgery and was d/c home Thursday. Pt family reports pt has not been able to urinate since then.

## 2020-08-08 NOTE — ED Notes (Signed)
Called [facility]  for transport to [address] [place]  [address]

## 2020-08-25 NOTE — Progress Notes (Signed)
08/26/2020  11:01 AM   Kristen Osborn 07-20-1926 [id number]  Referring provider: Danella Penton, MD [phone] [facility] [address] [facility] [place],  [place] [address] Chief Complaint  Patient presents with  . Urinary Retention     HPI: Kristen Osborn is a 87 y.o. female with a personal history of urinary retention and frequency, who presents today for urinary retention. She is accompanied today by her daughter.  She was recently seen during an inpatient admission after sustaining a fall and hip fracture.  During this admission, she developed urinary retention.  There is some question about possible vesicovaginal fistula due to coiling of the catheter but she was not able to tolerate a pelvic exam at that time.  Ultimately, she is not a surgical candidate and thus nothing was pursued.  She has failed multiple voiding trials.  She is not able to void independently since being discharged.  UA w microscopic catheterized from 08/08/2020 was negative  2 days ago, her catheter was inadvertently removed after stepping on the tubing.  Since then, she has experienced worsening abdominal distention and lower abdominal pain.  PVR today was 659.  She does have a history of advanced dementia with behavioral disturbance.  Her daughter provides the majority of her history today.   PMH: Past Medical History:  Diagnosis Date  . Atrial fibrillation (HCC)   . CHF (congestive heart failure) (HCC)   . Dementia [facility])     Surgical History: Past Surgical History:  Procedure Laterality Date  . ABDOMINAL HYSTERECTOMY    . CLOSED REDUCTION WRIST FRACTURE Right 06/28/2020   Procedure: CLOSED REDUCTION WRIST;  Surgeon: Lyndle Herrlich, MD;  Location: [facility] ORS;  Service: Orthopedics;  Laterality: Right;  . HIP ARTHROPLASTY Right 06/28/2020   Procedure: ARTHROPLASTY BIPOLAR HIP (HEMIARTHROPLASTY);  Surgeon: Lyndle Herrlich, MD;  Location: [facility] ORS;  Service: Orthopedics;  Laterality:  Right;  . ORIF PERIPROSTHETIC FRACTURE Right 08/03/2020   Procedure: OPEN REDUCTION INTERNAL FIXATION (ORIF) PERIPROSTHETIC FRACTURE;  Surgeon: Lyndle Herrlich, MD;  Location: [facility] ORS;  Service: Orthopedics;  Laterality: Right;    Home Medications:  Allergies as of 08/26/2020      Reactions   Lisinopril Other (See Comments)      Medication List       Accurate as of August 26, 2020 11:01 AM. If you have any questions, ask your nurse or doctor.        STOP taking these medications   ciprofloxacin 250 MG tablet Commonly known as: CIPRO Stopped by: Vanna Scotland, MD   HYDROcodone-acetaminophen 5-325 MG tablet Commonly known as: NORCO/VICODIN Stopped by: Vanna Scotland, MD   lidocaine 5 % Commonly known as: LIDODERM Stopped by: Vanna Scotland, MD     TAKE these medications   apixaban 2.5 MG Tabs tablet Commonly known as: ELIQUIS Take 1 tablet (2.5 mg total) by mouth 2 (two) times daily.   bisacodyl 5 MG EC tablet Commonly known as: DULCOLAX Take 2 tablets (10 mg total) by mouth daily as needed for moderate constipation.   cefUROXime 250 MG tablet Commonly known as: CEFTIN cefuroxime axetil 250 mg tablet  TAKE 1 TABLET BY MOUTH TWICE DAILY FOR 7 DAYS   docusate sodium 100 MG capsule Commonly known as: COLACE Take 1 capsule (100 mg total) by mouth 2 (two) times daily.   feeding supplement Liqd Take 237 mLs by mouth 3 (three) times daily between meals.   ferrous sulfate 325 (65 FE) MG tablet Take 1  tablet (325 mg total) by mouth daily with breakfast.   liver oil-zinc oxide 40 % ointment Commonly known as: DESITIN Apply topically as needed for irritation.   losartan 25 MG tablet Commonly known as: COZAAR Take 1 tablet (25 mg total) by mouth daily. Skip dose if SBP <120 mmHg   metoprolol tartrate 25 MG tablet Commonly known as: LOPRESSOR Take 1 tablet (25 mg total) by mouth every 6 (six) hours. Skip dose if SBP <110 mmHg   multivitamin with minerals Tabs  tablet Take 1 tablet by mouth daily.   nystatin cream Commonly known as: MYCOSTATIN Apply topically 2 (two) times daily.   omeprazole 20 MG capsule Commonly known as: PRILOSEC Take 20 mg by mouth daily.   polyethylene glycol 17 g packet Commonly known as: MIRALAX / GLYCOLAX Take 17 g by mouth daily as needed for mild constipation or moderate constipation.   potassium chloride 8 MEQ tablet Commonly known as: KLOR-CON potassium chloride ER 8 mEq tablet,extended release  TAKE 1 TABLET BY MOUTH ONCE DAILY What changed: Another medication with the same name was removed. Continue taking this medication, and follow the directions you see here. Changed by: Vanna Scotland, MD   QUEtiapine 25 MG tablet Commonly known as: SEROQUEL Take 25 mg by mouth daily. Takes at 5 pm   rivastigmine 1.5 MG capsule Commonly known as: EXELON rivastigmine 1.5 mg capsule   Vitamin D 125 MCG (5000 UT) Caps Take 1 capsule by mouth daily.       Allergies: Allergies  Allergen Reactions  . Lisinopril Other (See Comments)    Family History: No family history on file.  Social History:   reports that she has never smoked. She has never used smokeless tobacco. She reports previous alcohol use. She reports that she does not use drugs.  ROS: Pertinent ROS in HPI.  Physical Exam: BP (!) 144/71   Pulse 89   Ht 5\' 6"  (1.676 m)   BMI 17.61 kg/m   Constitutional: Alert, dressed in regular close.  Daughter provides most of the history today.  She does express some discomfort. HEENT: Myton AT, moist mucus membranes.  Trachea midline, no masses. Cardiovascular: No clubbing, cyanosis, or edema. Respiratory: Normal respiratory effort, no increased work of breathing. Skin: No rashes, bruises or suspicious lesions. Neurologic: Grossly intact, no focal deficits, moving all 4 extremities. Psychiatric: Normal mood and affect.  Laboratory Data:  Results for orders placed or performed in visit on 08/26/20   Bladder Scan (Post Void Residual) in office  Result Value Ref Range   Scan Result 659    Lab Results  Component Value Date   CREATININE 0.50 08/08/2020    Lab Results  Component Value Date   HGBA1C 5.2 08/05/2020    Assessment & Plan:    1. Urinary retention Refractory urinary retention, Foley replaced with greater than 700 cc in her bladder today  Given that she is failed to void spontaneously now that she is ambulating, its likely that her urinary retention is chronic  We discussed alternatives to indwelling Foley catheter including suprapubic tube and CIC.  We discussed risk and benefits of each.  Her daughter is most interested in pursuing suprapubic tube placement for mother's comfort and also ease of exchange down the road.  Plan for IR referral for SPT placement.    08/07/2020, am acting as a scribe for Dr. Jonette Pesa.   I have reviewed the above documentation for accuracy and completeness, and I agree with the  above.   Vanna Scotland, MD     [facility] [address] [place], [place] [address] [phone]

## 2020-08-26 ENCOUNTER — Encounter: Payer: Self-pay | Admitting: Urology

## 2020-08-26 ENCOUNTER — Ambulatory Visit: Payer: Medicare Other | Admitting: Urology

## 2020-08-26 ENCOUNTER — Other Ambulatory Visit: Payer: Self-pay

## 2020-08-26 VITALS — BP 144/71 | HR 89 | Ht 66.0 in

## 2020-08-26 DIAGNOSIS — R339 Retention of urine, unspecified: Secondary | ICD-10-CM | POA: Diagnosis not present

## 2020-08-26 LAB — BLADDER SCAN AMB NON-IMAGING: Scan Result: 659

## 2020-08-26 NOTE — Progress Notes (Signed)
Simple Catheter Placement  Due to urinary retention patient is present today for a foley cath placement.  Patient was cleaned and prepped in a sterile fashion with betadine. A 16 Coude FR foley catheter was inserted, could not advance removed and inserted a 16 FR foley urine return was noted  , urine was yellow in color.  The balloon was filled with 10cc of sterile water.  A night bag was attached for drainage. Patient was also given a night bag to take home and was given instruction on how to change from one bag to another.  Patient was given instruction on proper catheter care.  Patient tolerated well, no complications were noted   Performed by: Milas Kocher, CMA & Gerarda Gunther, RMA

## 2020-09-01 ENCOUNTER — Telehealth: Payer: Self-pay

## 2020-09-01 DIAGNOSIS — R339 Retention of urine, unspecified: Secondary | ICD-10-CM

## 2020-09-01 NOTE — Telephone Encounter (Signed)
-----   Message from Vanna Scotland, MD sent at 08/26/2020  1:39 PM EDT ----- SPT placement

## 2020-09-01 NOTE — Telephone Encounter (Signed)
Please schedule patient for placement of initial SP tube placement in CT. Patient is on Eliquis will need to stop 48 hours prior. Faxed Dr. Philemon Kingdom office for Clearance, awaiting response.

## 2020-09-01 NOTE — Telephone Encounter (Signed)
Per Marylu Lund in scheduling patient needs to see Dr. Gwen Pounds for a Clearance. His office will contact to make appt, patient has not been seen in 2 years.  Patient's daughter called needing clarification on appt details. No DPR or POA on file unable to speak with anyone but the patient at this time.  Offered to set up mychart to try and send documents electronically. She is not able to do this, she will come in to sign forms. She states her mom has dementia and is unable to make decisions.

## 2020-09-02 ENCOUNTER — Telehealth: Payer: Self-pay | Admitting: Urology

## 2020-09-02 NOTE — Telephone Encounter (Signed)
dghtr Kristen Osborn & Legrand Pitts @ [phone] wants to discuss procedure and has questions for a procedure that the pt will be getting, pt has dementia and can't speak for herself.  explained to them that there is no DPR on file and will need POA paperwork in order to speak to either of them regarding pt and her care. Voiced understanding and stated they/she will get that together and bring to office.

## 2020-09-03 NOTE — Telephone Encounter (Signed)
Contacted patient's daughter to check status of POA forms. She states she has been unable to bring them to the office. She was sent another mychart activation code and will attempt to set up and then take a picture of the POA and send via mychart for documentation. Once on file will contact to set up communication

## 2020-09-05 ENCOUNTER — Encounter: Payer: Self-pay | Admitting: Urology

## 2020-09-07 ENCOUNTER — Telehealth: Payer: Self-pay

## 2020-09-07 NOTE — Telephone Encounter (Signed)
Incoming call from pt's daughter Legrand Pitts who is patient's acting POA. (see documents in [place]) Daughter requests update on patient's status. Brief overview given to daughter on patient's need for SPT placement and the need to for cardiac clearance of Eliquis. Daughter voiced understanding and states that they have an appoitnment scheduled with cardiology for October 20, 2020. She also states that her mother is quite frail and expressed concerns about how patient will tolerate procedure. Advised daughter that we will follow up accordingly after cardiac evaluation.

## 2020-09-20 ENCOUNTER — Other Ambulatory Visit: Payer: Self-pay

## 2020-09-20 ENCOUNTER — Emergency Department
Admission: EM | Admit: 2020-09-20 | Discharge: 2020-09-21 | Disposition: A | Discharge status: Home / Self Care | Payer: Medicare Other | Attending: Emergency Medicine | Admitting: Emergency Medicine

## 2020-09-20 DIAGNOSIS — I509 Heart failure, unspecified: Secondary | ICD-10-CM | POA: Insufficient documentation

## 2020-09-20 DIAGNOSIS — G309 Alzheimer's disease, unspecified: Secondary | ICD-10-CM | POA: Diagnosis not present

## 2020-09-20 DIAGNOSIS — Z7901 Long term (current) use of anticoagulants: Secondary | ICD-10-CM | POA: Diagnosis not present

## 2020-09-20 DIAGNOSIS — Z950 Presence of cardiac pacemaker: Secondary | ICD-10-CM | POA: Diagnosis not present

## 2020-09-20 DIAGNOSIS — F0281 Dementia in other diseases classified elsewhere with behavioral disturbance: Secondary | ICD-10-CM | POA: Diagnosis not present

## 2020-09-20 DIAGNOSIS — Z79899 Other long term (current) drug therapy: Secondary | ICD-10-CM | POA: Diagnosis not present

## 2020-09-20 DIAGNOSIS — I110 Hypertensive heart disease with heart failure: Secondary | ICD-10-CM | POA: Diagnosis not present

## 2020-09-20 DIAGNOSIS — N390 Urinary tract infection, site not specified: Secondary | ICD-10-CM | POA: Diagnosis not present

## 2020-09-20 DIAGNOSIS — B9689 Other specified bacterial agents as the cause of diseases classified elsewhere: Secondary | ICD-10-CM | POA: Diagnosis not present

## 2020-09-20 DIAGNOSIS — R1030 Lower abdominal pain, unspecified: Secondary | ICD-10-CM | POA: Diagnosis present

## 2020-09-20 LAB — BASIC METABOLIC PANEL
Anion gap: 8 (ref 5–15)
BUN: 26 mg/dL — ABNORMAL HIGH (ref 8–23)
CO2: 25 mmol/L (ref 22–32)
Calcium: 8.6 mg/dL — ABNORMAL LOW (ref 8.9–10.3)
Chloride: 98 mmol/L (ref 98–111)
Creatinine, Ser: 0.87 mg/dL (ref 0.44–1.00)
GFR, Estimated: >60 mL/min (ref >60)
Glucose, Bld: 181 mg/dL — ABNORMAL HIGH (ref 70–99)
Potassium: 3.5 mmol/L (ref 3.5–5.1)
Sodium: 131 mmol/L — ABNORMAL LOW (ref 135–145)

## 2020-09-20 LAB — CBC WITH DIFFERENTIAL/PLATELET
Abs Immature Granulocytes: 0.02 10*3/uL (ref 0.00–0.07)
Basophils Absolute: 0.1 10*3/uL (ref 0.0–0.1)
Basophils Relative: 1 %
Eosinophils Absolute: 0.2 10*3/uL (ref 0.0–0.5)
Eosinophils Relative: 2 %
HCT: 32.4 % — ABNORMAL LOW (ref 36.0–46.0)
Hemoglobin: 10.6 g/dL — ABNORMAL LOW (ref 12.0–15.0)
Immature Granulocytes: 0 %
Lymphocytes Relative: 14 %
Lymphs Abs: 1 10*3/uL (ref 0.7–4.0)
MCH: 28.3 pg (ref 26.0–34.0)
MCHC: 32.7 g/dL (ref 30.0–36.0)
MCV: 86.6 fL (ref 80.0–100.0)
Monocytes Absolute: 0.9 10*3/uL (ref 0.1–1.0)
Monocytes Relative: 14 %
Neutro Abs: 4.6 10*3/uL (ref 1.7–7.7)
Neutrophils Relative %: 69 %
Platelets: 214 10*3/uL (ref 150–400)
RBC: 3.74 MIL/uL — ABNORMAL LOW (ref 3.87–5.11)
RDW: 14.5 % (ref 11.5–15.5)
WBC: 6.7 10*3/uL (ref 4.0–10.5)
nRBC: 0 % (ref 0.0–0.2)

## 2020-09-20 LAB — URINALYSIS, COMPLETE (UACMP) WITH MICROSCOPIC
Bilirubin Urine: NEGATIVE
Glucose, UA: NEGATIVE mg/dL
Ketones, ur: NEGATIVE mg/dL
Nitrite: NEGATIVE
Protein, ur: 100 mg/dL — AB
RBC / HPF: >50 RBC/hpf — ABNORMAL HIGH (ref 0–5)
Specific Gravity, Urine: 1.015 (ref 1.005–1.030)
Squamous Epithelial / HPF: NONE SEEN (ref 0–5)
WBC, UA: >50 WBC/hpf — ABNORMAL HIGH (ref 0–5)
pH: 7 (ref 5.0–8.0)

## 2020-09-20 MED ORDER — SODIUM CHLORIDE 0.9 % IV BOLUS
1000.0000 mL | Freq: Once | INTRAVENOUS | Status: AC
  Administered 2020-09-20: 1000 mL via INTRAVENOUS

## 2020-09-20 MED ORDER — SODIUM CHLORIDE 0.9 % IV SOLN
1.0000 g | Freq: Once | INTRAVENOUS | Status: AC
  Administered 2020-09-20: 1 g via INTRAVENOUS
  Filled 2020-09-20: qty 10

## 2020-09-20 MED ORDER — CEPHALEXIN 500 MG PO CAPS: 500.0000 mg | ORAL_CAPSULE | Freq: Four times a day (QID) | ORAL | 0 refills | Status: DC

## 2020-09-20 NOTE — ED Provider Notes (Signed)
[facility] Emergency Department Provider Note    ____________________________________________   I have reviewed the triage vital signs and the nursing notes.   HISTORY  Chief Complaint Urinary Retention   History limited by and level 5 caveat due to: Dementia, history primarily obtained from family at bedside   HPI Kristen Osborn is a 87 y.o. female who presents to the emergency department today accompanied by family because of concerns for possible urinary catheter issues.  Family states that they have noticed that the patient has had less urine output in her Foley catheter bag.  This has been going on over the past roughly week.  Additionally the patient has been complaining of pain in the suprapubic region.  They were concerned for possible urinary retention which the patient has had in the past.  They were worried that the urinary catheter might have been misplaced.  Patient has not had any fevers.  Family does state that patient's oral intake has been decreased.   Records reviewed. Per medical record review patient has a history of dementia  Past Medical History:  Diagnosis Date  . Atrial fibrillation (HCC)   . CHF (congestive heart failure) (HCC)   . Dementia [facility])     Patient Active Problem List   Diagnosis Date Noted  . Closed right hip fracture, initial encounter (HCC) 08/01/2020  . Closed left hip fracture (HCC) 07/31/2020  . Protein-calorie malnutrition, severe 06/26/2020  . Chronic anticoagulation 06/25/2020  . Closed displaced fracture of right femoral neck (HCC) 06/25/2020  . Preoperative clearance 06/25/2020  . Distal radius fracture, right 06/25/2020  . Closed right hip fracture (HCC) 06/25/2020  . PAD (peripheral artery disease) (HCC) 10/17/2018  . Status post placement of cardiac pacemaker 10/17/2018  . Alzheimer's dementia without behavioral disturbance (HCC) 04/17/2018  . Atrial fibrillation and flutter (HCC) 04/17/2018  .  Benign essential HTN 03/20/2014    Past Surgical History:  Procedure Laterality Date  . ABDOMINAL HYSTERECTOMY    . CLOSED REDUCTION WRIST FRACTURE Right 06/28/2020   Procedure: CLOSED REDUCTION WRIST;  Surgeon: Lyndle Herrlich, MD;  Location: [facility] ORS;  Service: Orthopedics;  Laterality: Right;  . HIP ARTHROPLASTY Right 06/28/2020   Procedure: ARTHROPLASTY BIPOLAR HIP (HEMIARTHROPLASTY);  Surgeon: Lyndle Herrlich, MD;  Location: [facility] ORS;  Service: Orthopedics;  Laterality: Right;  . ORIF PERIPROSTHETIC FRACTURE Right 08/03/2020   Procedure: OPEN REDUCTION INTERNAL FIXATION (ORIF) PERIPROSTHETIC FRACTURE;  Surgeon: Lyndle Herrlich, MD;  Location: [facility] ORS;  Service: Orthopedics;  Laterality: Right;    Prior to Admission medications   Medication Sig Start Date End Date Taking? Authorizing Provider  apixaban (ELIQUIS) 2.5 MG TABS tablet Take 1 tablet (2.5 mg total) by mouth 2 (two) times daily. 07/06/20   Arnetha Courser, MD  bisacodyl (DULCOLAX) 5 MG EC tablet Take 2 tablets (10 mg total) by mouth daily as needed for moderate constipation. 08/06/20   Gillis Santa, MD  cefUROXime (CEFTIN) 250 MG tablet cefuroxime axetil 250 mg tablet  TAKE 1 TABLET BY MOUTH TWICE DAILY FOR 7 DAYS    [provider]  Cholecalciferol (VITAMIN D) 125 MCG (5000 UT) CAPS Take 1 capsule by mouth daily. 08/06/20   Gillis Santa, MD  docusate sodium (COLACE) 100 MG capsule Take 1 capsule (100 mg total) by mouth 2 (two) times daily. 07/06/20   Arnetha Courser, MD  feeding supplement (ENSURE ENLIVE / ENSURE PLUS) LIQD Take 237 mLs by mouth 3 (three) times daily between meals. 07/06/20   Amin,  Tilman Neat, MD  ferrous sulfate 325 (65 FE) MG tablet Take 1 tablet (325 mg total) by mouth daily with breakfast. 08/06/20   Gillis Santa, MD  liver oil-zinc oxide (DESITIN) 40 % ointment Apply topically as needed for irritation. 07/06/20   Arnetha Courser, MD  losartan (COZAAR) 25 MG tablet Take 1 tablet (25 mg total) by mouth daily.  Skip dose if SBP <120 mmHg 08/06/20   Gillis Santa, MD  metoprolol tartrate (LOPRESSOR) 25 MG tablet Take 1 tablet (25 mg total) by mouth every 6 (six) hours. Skip dose if SBP <110 mmHg 08/06/20   Gillis Santa, MD  Multiple Vitamin (MULTIVITAMIN WITH MINERALS) TABS tablet Take 1 tablet by mouth daily. 07/06/20   Arnetha Courser, MD  nystatin cream (MYCOSTATIN) Apply topically 2 (two) times daily. 07/13/20   [provider]  omeprazole (PRILOSEC) 20 MG capsule Take 20 mg by mouth daily. 04/17/20   [provider]  polyethylene glycol (MIRALAX / GLYCOLAX) 17 g packet Take 17 g by mouth daily as needed for mild constipation or moderate constipation. 07/06/20   Arnetha Courser, MD  potassium chloride (KLOR-CON) 8 MEQ tablet potassium chloride ER 8 mEq tablet,extended release  TAKE 1 TABLET BY MOUTH ONCE DAILY    [provider]  QUEtiapine (SEROQUEL) 25 MG tablet Take 25 mg by mouth daily. Takes at 5 pm 06/16/20   [provider]  rivastigmine (EXELON) 1.5 MG capsule rivastigmine 1.5 mg capsule    [provider]    Allergies Lisinopril  History reviewed. No pertinent family history.  Social History Social History   Tobacco Use  . Smoking status: Never Smoker  . Smokeless tobacco: Never Used  Vaping Use  . Vaping Use: Never used  Substance Use Topics  . Alcohol use: Not Currently  . Drug use: Never    Review of Systems Unable to obtain secondary to dementia.  ____________________________________________   PHYSICAL EXAM:  VITAL SIGNS: ED Triage Vitals  Enc Vitals Group     BP 09/20/20 2045 124/66     Pulse Rate 09/20/20 2045 85     Resp 09/20/20 2045 18     Temp 09/20/20 2045 98 F (36.7 C)     Temp Source 09/20/20 2045 Oral     SpO2 09/20/20 2045 97 %     Weight 09/20/20 2045 100 lb (45.4 kg)     Height 09/20/20 2045 5\' 7"  (1.702 m)     Head Circumference --      Peak Flow --      Pain Score 09/20/20 2051 10   Constitutional: Awake  and alert. Not completely oriented.  Eyes: Conjunctivae are normal.  ENT      Head: Normocephalic and atraumatic.      Nose: No congestion/rhinnorhea.      Mouth/Throat: Mucous membranes are moist.      Neck: No stridor. Hematological/Lymphatic/Immunilogical: No cervical lymphadenopathy. Cardiovascular: Normal rate, regular rhythm.  No murmurs, rubs, or gallops.  Respiratory: Normal respiratory effort without tachypnea nor retractions. Breath sounds are clear and equal bilaterally. No wheezes/rales/rhonchi. Gastrointestinal: Soft and minimally tender in the suprapubic region.  Genitourinary: Indwelling catheter in place.  Musculoskeletal: Normal range of motion in all extremities. No lower extremity edema. Neurologic:  Not completely oriented.  Skin:  Skin is warm, dry and intact. No rash noted.  ____________________________________________    LABS (pertinent positives/negatives)  UA turbid, large hgb dipstick, moderate leukocytes, >50 RBC and WBC, wbc clumps present  ____________________________________________   EKG  None  ____________________________________________    RADIOLOGY  None  ____________________________________________   PROCEDURES  Procedures  ____________________________________________   INITIAL IMPRESSION / ASSESSMENT AND PLAN / ED COURSE  Pertinent labs & imaging results that were available during my care of the patient were reviewed by me and considered in my medical decision making (see chart for details).   Patient presented to the emergency department today with family concern for possible urinary retention.  Bladder scan here does not show large volume of urine.  Given pain did have concern for possible infection.  Urine here is concerning for urinary tract infection.  This could explain some of the patient's discomfort.  Patient will be given IV fluids and antibiotics in the emergency department. Discussed possible admission versus discharge  with outpatient antibiotics. Given reassuring blood work do think it is reasonable for patient to be discharged home and family is comfortable with that plan.  ____________________________________________   FINAL CLINICAL IMPRESSION(S) / ED DIAGNOSES  Final diagnoses:  Lower urinary tract infectious disease     Note: This dictation was prepared with Dragon dictation. Any transcriptional errors that result from this process are unintentional     Phineas Semen, MD 09/20/20 2342

## 2020-09-20 NOTE — Discharge Instructions (Signed)
Please seek medical attention for any high fevers, chest pain, shortness of breath, change in behavior, persistent vomiting, bloody stool or any other new or concerning symptoms.  

## 2020-09-20 NOTE — ED Notes (Addendum)
Bladder scan: 26ml  urine in drainage bag. Has not been emptied today.

## 2020-09-20 NOTE — ED Notes (Signed)
Patient resting on stretcher, daughter at bedside, updated with results at this time, IVF infusing, awaiting physician re eval.

## 2020-09-20 NOTE — ED Triage Notes (Addendum)
Pt presents to ER c/o her urinary catheter possibly coming out.  pts daughter states pt's lower abdomen has become more swollen recently and has noticed less UO since 5/11.  Pt has been having drainage around catheter area as well.  Pt supposed to have urologist appt on 5/17 to have need for catheter evaluated.  Pt has hx of dementia.

## 2020-09-21 NOTE — ED Notes (Signed)
Pts catheter tubing changed due to leak found in bag. New bag with tubing in place. Daughter provided with discharge packet as well as follow up instructions and care. Verbal understanding provided back via daughter at bedside. E-sign not working on Financial controller, copy printed and placed into medical records box.

## 2020-09-22 ENCOUNTER — Other Ambulatory Visit: Payer: Self-pay

## 2020-09-22 ENCOUNTER — Ambulatory Visit: Payer: Medicare Other | Admitting: Urology

## 2020-09-22 ENCOUNTER — Ambulatory Visit: Payer: Medicare Other | Admitting: Physician Assistant

## 2020-09-22 DIAGNOSIS — R339 Retention of urine, unspecified: Secondary | ICD-10-CM

## 2020-09-22 DIAGNOSIS — N3289 Other specified disorders of bladder: Secondary | ICD-10-CM

## 2020-09-22 MED ORDER — MIRABEGRON ER 25 MG PO TB24: 25.0000 mg | ORAL_TABLET | Freq: Every day | ORAL | 0 refills | Status: DC

## 2020-09-22 NOTE — Progress Notes (Signed)
Cath Change/ Replacement  Patient is present today for a catheter change due to urinary retention.  35ml of water was removed from the balloon, a 16FR foley cath was removed without difficulty.  Patient was cleaned and prepped in a sterile fashion with betadine. A 16FR foley cath was replaced into the bladder no complications were noted Urine return was not noted, however appropriate placement within the urethra was confirmed through palpation of the anterior vaginal wall; patient was counseled to return to clinic if it did not start draining within 3 hours. The balloon was filled with 67ml of sterile water. A night bag was attached for drainage via StatLock to the patient's nondominant left leg and a dummy red rubber catheter unattached to anything was taped to her dominant right leg for distraction.      Performed by: Carman Ching, PA-C and Franchot Erichsen, CMA  Additional notes: Starting Myrbetriq 25mg  daily for management of bladder spasms.  Follow up: Return in about 4 weeks (around 10/20/2020) for Catheter exchange.

## 2020-09-23 ENCOUNTER — Ambulatory Visit (INDEPENDENT_AMBULATORY_CARE_PROVIDER_SITE_OTHER): Payer: Medicare Other | Admitting: Physician Assistant

## 2020-09-23 DIAGNOSIS — R339 Retention of urine, unspecified: Secondary | ICD-10-CM

## 2020-09-23 LAB — URINE CULTURE: Culture: >=100000 — AB

## 2020-09-23 NOTE — Progress Notes (Signed)
I saw the patient in clinic yesterday for scheduled Foley catheter exchange.  Patient returns to clinic today with her 2 daughters with reports of nondraining Foley catheter since placement.  They state urine never drained into her night bag, however the bag became detached from the catheter overnight and she was awoken to a wet bed.  She subsequently bathed and reconnected the back to her catheter and has continued to note no urinary drainage.  Bladder scan today with 3 mL, however her night bag appears dry as though no urine has never drained into it.  Notably, there has been a question in the past about a possible vesicovaginal fistula.  Given decreased urinary output with no evidence of possible retention, I recommended Foley catheter exchange due to concerns for possibly misplaced Foley catheter, see procedure note below.  Additionally, I will get a BMP today to check her renal function as a possible source of decreased urinary output.  Cath Change/ Replacement  Patient is present today for a catheter change due to urinary retention.  86ml of water was removed from the balloon, a 16FR foley cath was removed without difficulty.  Patient was cleaned and prepped in a sterile fashion with betadine. A 16 FR coude foley cath was replaced into the bladder no complications were noted Urine return was noted 47ml and urine was yellow in color. The balloon was filled with 89ml of sterile water. A night bag was attached for drainage.     Performed by: Carman Ching, PA-C and Franchot Erichsen, CMA  Follow up: As scheduled

## 2020-09-24 ENCOUNTER — Other Ambulatory Visit: Payer: Self-pay | Admitting: Physician Assistant

## 2020-09-24 ENCOUNTER — Telehealth: Payer: Self-pay

## 2020-09-24 DIAGNOSIS — R34 Anuria and oliguria: Secondary | ICD-10-CM

## 2020-09-24 LAB — BASIC METABOLIC PANEL
BUN/Creatinine Ratio: 27 (ref 12–28)
BUN: 24 mg/dL (ref 10–36)
CO2: 24 mmol/L (ref 20–29)
Calcium: 8.7 mg/dL (ref 8.7–10.3)
Chloride: 95 mmol/L — ABNORMAL LOW (ref 96–106)
Creatinine, Ser: 0.9 mg/dL (ref 0.57–1.00)
Glucose: 120 mg/dL — ABNORMAL HIGH (ref 65–99)
Potassium: 3.5 mmol/L (ref 3.5–5.2)
Sodium: 134 mmol/L (ref 134–144)
eGFR: 60 mL/min/{1.73_m2} (ref >59)

## 2020-09-24 NOTE — Consult Note (Signed)
Messaged Samantha Valillancourt PA, about positive urine culture results. Pt to receive a stat renal ultrasound.   Thanks,   Paschal Dopp, PharmD, BCPS

## 2020-09-24 NOTE — Telephone Encounter (Signed)
-----   Message from Kipp Brood sent at 09/24/2020  9:30 AM EDT -----  ----- Message ----- From: Carman Ching, PA-C Sent: 09/24/2020   9:24 AM EDT To: Kipp Brood  Please contact the patient's daughters and explain that her creatinine, the marker of her kidney function, has risen significantly in the past month. While it is still within normal limits, it is elevated compared to her baseline.  There are several things that can cause this value to rise, including dehydration or obstruction of the urinary passages. I recommend a STAT renal ultrasound to evaluate for the latter. I've placed the order this morning. ----- Message ----- From: Interface, [facility] Lab Results In Sent: 09/24/2020   5:39 AM EDT To: Carman Ching, PA-C

## 2020-09-24 NOTE — Telephone Encounter (Signed)
OK per DPR, LMOM for patients guardian (daughter) notifying her as advised. Instructed her to call office should she have any questions.

## 2020-09-25 ENCOUNTER — Other Ambulatory Visit: Payer: Self-pay | Admitting: Physician Assistant

## 2020-09-25 MED ORDER — SULFAMETHOXAZOLE-TRIMETHOPRIM 800-160 MG PO TABS: 1.0000 | ORAL_TABLET | Freq: Two times a day (BID) | ORAL | 0 refills | Status: AC

## 2020-09-29 ENCOUNTER — Ambulatory Visit
Admission: RE | Admit: 2020-09-29 | Discharge: 2020-09-29 | Disposition: A | Discharge status: Home / Self Care | Payer: Medicare Other | Source: Ambulatory Visit | Attending: Physician Assistant | Admitting: Physician Assistant

## 2020-09-29 ENCOUNTER — Other Ambulatory Visit: Payer: Self-pay

## 2020-09-29 DIAGNOSIS — R34 Anuria and oliguria: Secondary | ICD-10-CM | POA: Insufficient documentation

## 2020-10-01 ENCOUNTER — Telehealth: Payer: Self-pay

## 2020-10-01 NOTE — Telephone Encounter (Signed)
Patient daughter [name] looking for RUS results

## 2020-10-06 ENCOUNTER — Encounter: Payer: Self-pay | Admitting: *Deleted

## 2020-10-06 NOTE — Telephone Encounter (Signed)
Left

## 2020-10-06 NOTE — Telephone Encounter (Signed)
Left message to return our call. Also sent a my chart message .

## 2020-10-06 NOTE — Telephone Encounter (Signed)
Patient's daughter left a vmail on triage line requesting results report on RUS. Please advise

## 2020-10-06 NOTE — Telephone Encounter (Signed)
Renal US shows no signs of urinary obstruction to explain the rise in her creatinine. I recommend following up with her PCP for evaluation of other causes.

## 2020-10-20 ENCOUNTER — Other Ambulatory Visit: Payer: Self-pay

## 2020-10-20 ENCOUNTER — Ambulatory Visit: Payer: Medicare Other | Admitting: Physician Assistant

## 2020-10-20 DIAGNOSIS — R339 Retention of urine, unspecified: Secondary | ICD-10-CM | POA: Diagnosis not present

## 2020-10-20 LAB — BLADDER SCAN AMB NON-IMAGING

## 2020-10-20 NOTE — Progress Notes (Signed)
10/20/2020 10:47 AM   Kristen Osborn Nov 06, 1926 [id number]  CC: Chief Complaint  Patient presents with   Urinary Retention    HPI: Kristen Osborn is a 87 y.o. female with PMH dementia and urinary retention following a fall and hip fracture in early 2022 managed with chronic indwelling Foley catheter who presents today for scheduled Foley exchange.  She is accompanied today by her daughter, who contributes to HPI.   Today she reports having found scissors in the home 1 to 2 days ago and cutting her Foley catheter.  The catheter was removed in its entirety.  She has been able to void spontaneously since and denies dysuria or lower abdominal pain.  PVR 8 mL.  PMH: Past Medical History:  Diagnosis Date   Atrial fibrillation (HCC)    CHF (congestive heart failure) (HCC)    Dementia (HCC)     Surgical History: Past Surgical History:  Procedure Laterality Date   ABDOMINAL HYSTERECTOMY     CLOSED REDUCTION WRIST FRACTURE Right 06/28/2020   Procedure: CLOSED REDUCTION WRIST;  Surgeon: Lyndle Herrlich, MD;  Location: [facility];  Service: Orthopedics;  Laterality: Right;   HIP ARTHROPLASTY Right 06/28/2020   Procedure: ARTHROPLASTY BIPOLAR HIP (HEMIARTHROPLASTY);  Surgeon: Lyndle Herrlich, MD;  Location: [facility];  Service: Orthopedics;  Laterality: Right;   ORIF PERIPROSTHETIC FRACTURE Right 08/03/2020   Procedure: OPEN REDUCTION INTERNAL FIXATION (ORIF) PERIPROSTHETIC FRACTURE;  Surgeon: Lyndle Herrlich, MD;  Location: [facility];  Service: Orthopedics;  Laterality: Right;    Home Medications:  Allergies as of 10/20/2020       Reactions   Lisinopril Other (See Comments)        Medication List        Accurate as of October 20, 2020 10:47 AM. If you have any questions, ask your nurse or doctor.          apixaban 2.5 MG Tabs tablet Commonly known as: ELIQUIS Take 1 tablet (2.5 mg total) by mouth 2 (two) times daily.   bisacodyl 5 MG EC tablet Commonly known as:  DULCOLAX Take 2 tablets (10 mg total) by mouth daily as needed for moderate constipation.   docusate sodium 100 MG capsule Commonly known as: COLACE Take 1 capsule (100 mg total) by mouth 2 (two) times daily.   feeding supplement Liqd Take 237 mLs by mouth 3 (three) times daily between meals.   ferrous sulfate 325 (65 FE) MG tablet Take 1 tablet (325 mg total) by mouth daily with breakfast.   liver oil-zinc oxide 40 % ointment Commonly known as: DESITIN Apply topically as needed for irritation.   losartan 25 MG tablet Commonly known as: COZAAR Take 1 tablet (25 mg total) by mouth daily. Skip dose if SBP <120 mmHg   metoprolol tartrate 25 MG tablet Commonly known as: LOPRESSOR Take 1 tablet (25 mg total) by mouth every 6 (six) hours. Skip dose if SBP <110 mmHg   mirabegron ER 25 MG Tb24 tablet Commonly known as: MYRBETRIQ Take 1 tablet (25 mg total) by mouth daily.   multivitamin with minerals Tabs tablet Take 1 tablet by mouth daily.   nystatin cream Commonly known as: MYCOSTATIN Apply topically 2 (two) times daily.   omeprazole 20 MG capsule Commonly known as: PRILOSEC Take 20 mg by mouth daily.   polyethylene glycol 17 g packet Commonly known as: MIRALAX / GLYCOLAX Take 17 g by mouth daily as needed for mild constipation or moderate constipation.   potassium chloride  8 MEQ tablet Commonly known as: KLOR-CON potassium chloride ER 8 mEq tablet,extended release  TAKE 1 TABLET BY MOUTH ONCE DAILY   QUEtiapine 25 MG tablet Commonly known as: SEROQUEL Take 25 mg by mouth daily. Takes at 5 pm   rivastigmine 1.5 MG capsule Commonly known as: EXELON rivastigmine 1.5 mg capsule   Vitamin D 125 MCG (5000 UT) Caps Take 1 capsule by mouth daily.        Allergies:  Allergies  Allergen Reactions   Lisinopril Other (See Comments)    Family History: No family history on file.  Social History:   reports that she has never smoked. She has never used smokeless  tobacco. She reports previous alcohol use. She reports that she does not use drugs.  Physical Exam: There were no vitals taken for this visit.  Constitutional:  Alert and oriented, no acute distress, nontoxic appearing HEENT: [place], AT Cardiovascular: No clubbing, cyanosis, or edema Respiratory: Normal respiratory effort, no increased work of breathing Skin: No rashes, bruises or suspicious lesions Neurologic: Grossly intact, no focal deficits, moving all 4 extremities Psychiatric: Normal mood and affect  Laboratory Data: Results for orders placed or performed in visit on 10/20/20  Bladder Scan (Post Void Residual) in office  Result Value Ref Range   Scan Result 69mL    Assessment & Plan:   1. Urinary retention Patient removed her Foley catheter at home 1 to 2 days ago secondary to dementia, but does not remember doing this.  She has been voiding spontaneously since and is emptying appropriately today.  I recommended keeping her Foley catheter out with plans for close follow-up and repeat PVR in 2 weeks.  If patient is able to continue voiding spontaneously and emptying appropriately, may discontinue chronic indwelling Foley catheter.  Patient and daughter are in agreement with this plan.  Stopping Myrbetriq today for management of bladder spasms, as these may contribute to an adequate emptying.  We discussed return precautions today including difficulty voiding, low volume voids, and frequent voiding. - Bladder Scan (Post Void Residual) in office  Return in about 2 weeks (around 11/03/2020) for Repeat PVR.  Carman Ching, PA-C  [facility] [address], [address] [place], [place] [address] [phone]

## 2020-10-23 NOTE — Telephone Encounter (Signed)
Appointment with Dr. Philemon Kingdom office on 10/20/20, note not yet complete. Contacted his office to follow up on status of clearance. A note was sent back to his nurse requesting a status update and if a new clearance note would need to be faxed or if they would fax one to our office. Awaiting response.

## 2020-10-24 ENCOUNTER — Emergency Department: Payer: Medicare Other

## 2020-10-24 ENCOUNTER — Observation Stay
Admission: EM | Admit: 2020-10-24 | Discharge: 2020-10-25 | Disposition: A | Discharge status: Home / Self Care | Payer: Medicare Other | Attending: Internal Medicine | Admitting: Internal Medicine

## 2020-10-24 ENCOUNTER — Other Ambulatory Visit: Payer: Self-pay

## 2020-10-24 ENCOUNTER — Encounter: Payer: Self-pay | Admitting: Emergency Medicine

## 2020-10-24 DIAGNOSIS — I509 Heart failure, unspecified: Secondary | ICD-10-CM | POA: Diagnosis not present

## 2020-10-24 DIAGNOSIS — I4891 Unspecified atrial fibrillation: Secondary | ICD-10-CM | POA: Insufficient documentation

## 2020-10-24 DIAGNOSIS — W19XXXA Unspecified fall, initial encounter: Secondary | ICD-10-CM

## 2020-10-24 DIAGNOSIS — Z7901 Long term (current) use of anticoagulants: Secondary | ICD-10-CM | POA: Diagnosis not present

## 2020-10-24 DIAGNOSIS — S0240CA Maxillary fracture, right side, initial encounter for closed fracture: Secondary | ICD-10-CM | POA: Diagnosis not present

## 2020-10-24 DIAGNOSIS — R296 Repeated falls: Secondary | ICD-10-CM | POA: Diagnosis not present

## 2020-10-24 DIAGNOSIS — E441 Mild protein-calorie malnutrition: Secondary | ICD-10-CM | POA: Diagnosis not present

## 2020-10-24 DIAGNOSIS — I110 Hypertensive heart disease with heart failure: Secondary | ICD-10-CM | POA: Diagnosis not present

## 2020-10-24 DIAGNOSIS — E43 Unspecified severe protein-calorie malnutrition: Secondary | ICD-10-CM | POA: Diagnosis present

## 2020-10-24 DIAGNOSIS — Z79899 Other long term (current) drug therapy: Secondary | ICD-10-CM | POA: Insufficient documentation

## 2020-10-24 DIAGNOSIS — S52501A Unspecified fracture of the lower end of right radius, initial encounter for closed fracture: Secondary | ICD-10-CM | POA: Diagnosis present

## 2020-10-24 DIAGNOSIS — Z96641 Presence of right artificial hip joint: Secondary | ICD-10-CM | POA: Diagnosis not present

## 2020-10-24 DIAGNOSIS — S0993XA Unspecified injury of face, initial encounter: Secondary | ICD-10-CM | POA: Diagnosis present

## 2020-10-24 DIAGNOSIS — Z950 Presence of cardiac pacemaker: Secondary | ICD-10-CM | POA: Diagnosis present

## 2020-10-24 DIAGNOSIS — F0280 Dementia in other diseases classified elsewhere without behavioral disturbance: Secondary | ICD-10-CM | POA: Diagnosis present

## 2020-10-24 DIAGNOSIS — Z20822 Contact with and (suspected) exposure to covid-19: Secondary | ICD-10-CM | POA: Insufficient documentation

## 2020-10-24 DIAGNOSIS — W010XXA Fall on same level from slipping, tripping and stumbling without subsequent striking against object, initial encounter: Secondary | ICD-10-CM | POA: Diagnosis not present

## 2020-10-24 DIAGNOSIS — F0390 Unspecified dementia without behavioral disturbance: Secondary | ICD-10-CM | POA: Insufficient documentation

## 2020-10-24 DIAGNOSIS — I482 Chronic atrial fibrillation, unspecified: Secondary | ICD-10-CM | POA: Diagnosis present

## 2020-10-24 DIAGNOSIS — S72001A Fracture of unspecified part of neck of right femur, initial encounter for closed fracture: Secondary | ICD-10-CM | POA: Diagnosis present

## 2020-10-24 DIAGNOSIS — I10 Essential (primary) hypertension: Secondary | ICD-10-CM | POA: Diagnosis present

## 2020-10-24 DIAGNOSIS — S72002A Fracture of unspecified part of neck of left femur, initial encounter for closed fracture: Secondary | ICD-10-CM | POA: Diagnosis present

## 2020-10-24 DIAGNOSIS — I739 Peripheral vascular disease, unspecified: Secondary | ICD-10-CM | POA: Diagnosis present

## 2020-10-24 LAB — CBC WITH DIFFERENTIAL/PLATELET
Abs Immature Granulocytes: 0.05 10*3/uL (ref 0.00–0.07)
Basophils Absolute: 0 10*3/uL (ref 0.0–0.1)
Basophils Relative: 0 %
Eosinophils Absolute: 0.1 10*3/uL (ref 0.0–0.5)
Eosinophils Relative: 1 %
HCT: 34.9 % — ABNORMAL LOW (ref 36.0–46.0)
Hemoglobin: 11.8 g/dL — ABNORMAL LOW (ref 12.0–15.0)
Immature Granulocytes: 1 %
Lymphocytes Relative: 12 %
Lymphs Abs: 1.1 10*3/uL (ref 0.7–4.0)
MCH: 29.4 pg (ref 26.0–34.0)
MCHC: 33.8 g/dL (ref 30.0–36.0)
MCV: 87 fL (ref 80.0–100.0)
Monocytes Absolute: 0.8 10*3/uL (ref 0.1–1.0)
Monocytes Relative: 9 %
Neutro Abs: 7.2 10*3/uL (ref 1.7–7.7)
Neutrophils Relative %: 77 %
Platelets: 179 10*3/uL (ref 150–400)
RBC: 4.01 MIL/uL (ref 3.87–5.11)
RDW: 14.7 % (ref 11.5–15.5)
WBC: 9.2 10*3/uL (ref 4.0–10.5)
nRBC: 0 % (ref 0.0–0.2)

## 2020-10-24 LAB — COMPREHENSIVE METABOLIC PANEL
ALT: 27 U/L (ref 0–44)
AST: 36 U/L (ref 15–41)
Albumin: 3.8 g/dL (ref 3.5–5.0)
Alkaline Phosphatase: 108 U/L (ref 38–126)
Anion gap: 5 (ref 5–15)
BUN: 27 mg/dL — ABNORMAL HIGH (ref 8–23)
CO2: 28 mmol/L (ref 22–32)
Calcium: 8.9 mg/dL (ref 8.9–10.3)
Chloride: 103 mmol/L (ref 98–111)
Creatinine, Ser: 0.88 mg/dL (ref 0.44–1.00)
GFR, Estimated: >60 mL/min (ref >60)
Glucose, Bld: 117 mg/dL — ABNORMAL HIGH (ref 70–99)
Potassium: 3.9 mmol/L (ref 3.5–5.1)
Sodium: 136 mmol/L (ref 135–145)
Total Bilirubin: 1 mg/dL (ref 0.3–1.2)
Total Protein: 7.1 g/dL (ref 6.5–8.1)

## 2020-10-24 LAB — PROTIME-INR
INR: 1.3 — ABNORMAL HIGH (ref 0.8–1.2)
Prothrombin Time: 15.9 seconds — ABNORMAL HIGH (ref 11.4–15.2)

## 2020-10-24 LAB — RESP PANEL BY RT-PCR (FLU A&B, COVID) ARPGX2
Influenza A by PCR: NEGATIVE
Influenza B by PCR: NEGATIVE
SARS Coronavirus 2 by RT PCR: NEGATIVE

## 2020-10-24 MED ORDER — PANTOPRAZOLE SODIUM 40 MG PO TBEC: 40.0000 mg | DELAYED_RELEASE_TABLET | Freq: Every day | ORAL | Status: DC

## 2020-10-24 MED ORDER — VITAMIN D 25 MCG (1000 UNIT) PO TABS: 5000.0000 [IU] | ORAL_TABLET | Freq: Every day | ORAL | Status: DC

## 2020-10-24 MED ORDER — DOCUSATE SODIUM 100 MG PO CAPS: 100.0000 mg | ORAL_CAPSULE | Freq: Two times a day (BID) | ORAL | Status: DC

## 2020-10-24 MED ORDER — QUETIAPINE FUMARATE 25 MG PO TABS: 25.0000 mg | ORAL_TABLET | Freq: Every day | ORAL | Status: DC

## 2020-10-24 MED ORDER — ACETAMINOPHEN 325 MG PO TABS: 650.0000 mg | ORAL_TABLET | Freq: Four times a day (QID) | ORAL | Status: DC | PRN

## 2020-10-24 MED ORDER — BISACODYL 5 MG PO TBEC: 10.0000 mg | DELAYED_RELEASE_TABLET | Freq: Every day | ORAL | Status: DC | PRN

## 2020-10-24 MED ORDER — HYDRALAZINE HCL 50 MG PO TABS: 25.0000 mg | ORAL_TABLET | Freq: Three times a day (TID) | ORAL | Status: DC | PRN

## 2020-10-24 MED ORDER — ONDANSETRON HCL 4 MG PO TABS: 4.0000 mg | ORAL_TABLET | Freq: Four times a day (QID) | ORAL | Status: DC | PRN

## 2020-10-24 MED ORDER — FERROUS SULFATE 325 (65 FE) MG PO TABS: 325.0000 mg | ORAL_TABLET | Freq: Every day | ORAL | Status: DC

## 2020-10-24 MED ORDER — ACETAMINOPHEN 650 MG RE SUPP: 650.0000 mg | Freq: Four times a day (QID) | RECTAL | Status: DC | PRN

## 2020-10-24 MED ORDER — KETOROLAC TROMETHAMINE 15 MG/ML IJ SOLN
15.0000 mg | Freq: Four times a day (QID) | INTRAMUSCULAR | Status: DC | PRN
  Administered 2020-10-25: 15 mg via INTRAVENOUS
  Filled 2020-10-24 (×2): qty 1

## 2020-10-24 MED ORDER — LOSARTAN POTASSIUM 50 MG PO TABS: 25.0000 mg | ORAL_TABLET | Freq: Every day | ORAL | Status: DC

## 2020-10-24 MED ORDER — ADULT MULTIVITAMIN W/MINERALS CH: 1.0000 | ORAL_TABLET | Freq: Every day | ORAL | Status: DC

## 2020-10-24 MED ORDER — ONDANSETRON HCL 4 MG/2ML IJ SOLN: 4.0000 mg | Freq: Four times a day (QID) | INTRAMUSCULAR | Status: DC | PRN

## 2020-10-24 MED ORDER — ACETAMINOPHEN 10 MG/ML IV SOLN
1000.0000 mg | INTRAVENOUS | Status: AC
  Administered 2020-10-24: 1000 mg via INTRAVENOUS
  Filled 2020-10-24: qty 100

## 2020-10-24 MED ORDER — ENSURE ENLIVE PO LIQD: 237.0000 mL | Freq: Three times a day (TID) | ORAL | Status: DC

## 2020-10-24 MED ORDER — POLYETHYLENE GLYCOL 3350 17 G PO PACK: 17.0000 g | PACK | Freq: Every day | ORAL | Status: DC | PRN

## 2020-10-24 NOTE — ED Notes (Signed)
Pt given a sandwich tray and a ginger ale. °

## 2020-10-24 NOTE — ED Triage Notes (Signed)
Pt via EMS from home. Pt had a mechanical fall this afternoon, pt tripped on a rug and fell on some concrete. Pt states she hurts all over. Pt has lac to the R eye, R hip, skin tears noted to bilateral elbows, and contusion to R wrist. Pt is alert but oriented to self only. Pt has a hx of dementia.

## 2020-10-24 NOTE — ED Provider Notes (Signed)
Kristen Osborn  ____________________________________________   Event Date/Time   First MD Initiated Contact with Patient 10/24/20 1844     (approximate)  I have reviewed the triage vital signs and the nursing notes.   HISTORY  Chief Complaint Fall   HPI Kristen Osborn is a 87 y.o. female on Eliquis and metoprolol, dementia, CHF and closed right hip fracture in March earlier this year who presents accompanied by daughter for assessment after ground-level mechanical fall that was witnessed by daughter.  Patient's daughter states the patient tripped while walking in her patient's home.  She states he hit her head.  She did not have any LOC.  Patient is complaining of mild headache.  Per patient and daughter she has not had any other recent sick symptoms including vomiting, chest pain, cough, fever, diarrhea, dysuria did have a fall last night as well but they are unable to specify details of this.  Patient did take her medicine this morning.  No other acute concerns at this time.  Patient is not normally oriented at baseline.         Past Medical History:  Diagnosis Date   Atrial fibrillation [facility])    CHF (congestive heart failure) (HCC)    Dementia (HCC)     Patient Active Problem List   Diagnosis Date Noted   Frequent falls 10/24/2020   Closed right hip fracture, initial encounter (HCC) 08/01/2020   Closed left hip fracture (HCC) 07/31/2020   Protein-calorie malnutrition, severe 06/26/2020   Chronic anticoagulation 06/25/2020   Closed displaced fracture of right femoral neck (HCC) 06/25/2020   Preoperative clearance 06/25/2020   Distal radius fracture, right 06/25/2020   Closed right hip fracture (HCC) 06/25/2020   PAD (peripheral artery disease) (HCC) 10/17/2018   Status post placement of cardiac pacemaker 10/17/2018   Alzheimer's dementia without behavioral disturbance (HCC) 04/17/2018   Atrial fibrillation and  flutter (HCC) 04/17/2018   Benign essential HTN 03/20/2014    Past Surgical History:  Procedure Laterality Date   ABDOMINAL HYSTERECTOMY     CLOSED REDUCTION WRIST FRACTURE Right 06/28/2020   Procedure: CLOSED REDUCTION WRIST;  Surgeon: Lyndle Herrlich, MD;  Location: [facility] ORS;  Service: Orthopedics;  Laterality: Right;   HIP ARTHROPLASTY Right 06/28/2020   Procedure: ARTHROPLASTY BIPOLAR HIP (HEMIARTHROPLASTY);  Surgeon: Lyndle Herrlich, MD;  Location: [facility] ORS;  Service: Orthopedics;  Laterality: Right;   ORIF PERIPROSTHETIC FRACTURE Right 08/03/2020   Procedure: OPEN REDUCTION INTERNAL FIXATION (ORIF) PERIPROSTHETIC FRACTURE;  Surgeon: Lyndle Herrlich, MD;  Location: [facility] ORS;  Service: Orthopedics;  Laterality: Right;    Prior to Admission medications   Medication Sig Start Date End Date Taking? Authorizing Provider  apixaban (ELIQUIS) 2.5 MG TABS tablet Take 1 tablet (2.5 mg total) by mouth 2 (two) times daily. 07/06/20   Arnetha Courser, MD  bisacodyl (DULCOLAX) 5 MG EC tablet Take 2 tablets (10 mg total) by mouth daily as needed for moderate constipation. 08/06/20   Gillis Santa, MD  Cholecalciferol (VITAMIN D) 125 MCG (5000 UT) CAPS Take 1 capsule by mouth daily. 08/06/20   Gillis Santa, MD  docusate sodium (COLACE) 100 MG capsule Take 1 capsule (100 mg total) by mouth 2 (two) times daily. 07/06/20   Arnetha Courser, MD  feeding supplement (ENSURE ENLIVE / ENSURE PLUS) LIQD Take 237 mLs by mouth 3 (three) times daily between meals. 07/06/20   Arnetha Courser, MD  ferrous sulfate 325 (65 FE) MG tablet Take 1  tablet (325 mg total) by mouth daily with breakfast. 08/06/20   Gillis Santa, MD  liver oil-zinc oxide (DESITIN) 40 % ointment Apply topically as needed for irritation. 07/06/20   Arnetha Courser, MD  losartan (COZAAR) 25 MG tablet Take 1 tablet (25 mg total) by mouth daily. Skip dose if SBP <120 mmHg 08/06/20   Gillis Santa, MD  metoprolol tartrate (LOPRESSOR) 25 MG tablet Take 1 tablet (25 mg  total) by mouth every 6 (six) hours. Skip dose if SBP <110 mmHg 08/06/20   Gillis Santa, MD  Multiple Vitamin (MULTIVITAMIN WITH MINERALS) TABS tablet Take 1 tablet by mouth daily. 07/06/20   Arnetha Courser, MD  nystatin cream (MYCOSTATIN) Apply topically 2 (two) times daily. 07/13/20   [provider]  omeprazole (PRILOSEC) 20 MG capsule Take 20 mg by mouth daily. 04/17/20   [provider]  polyethylene glycol (MIRALAX / GLYCOLAX) 17 g packet Take 17 g by mouth daily as needed for mild constipation or moderate constipation. 07/06/20   Arnetha Courser, MD  potassium chloride (KLOR-CON) 8 MEQ tablet potassium chloride ER 8 mEq tablet,extended release  TAKE 1 TABLET BY MOUTH ONCE DAILY    [provider]  QUEtiapine (SEROQUEL) 25 MG tablet Take 25 mg by mouth daily. Takes at 5 pm 06/16/20   [provider]  rivastigmine (EXELON) 1.5 MG capsule rivastigmine 1.5 mg capsule    [provider]    Allergies Lisinopril  History reviewed. No pertinent family history.  Social History Social History   Tobacco Use   Smoking status: Never   Smokeless tobacco: Never  Vaping Use   Vaping Use: Never used  Substance Use Topics   Alcohol use: Not Currently   Drug use: Never    Review of Systems  Review of Systems  Unable to perform ROS: Dementia     ____________________________________________   PHYSICAL EXAM:  VITAL SIGNS: ED Triage Vitals  Enc Vitals Group     BP      Pulse      Resp      Temp      Temp src      SpO2      Weight      Height      Head Circumference      Peak Flow      Pain Score      Pain Loc      Pain Edu?      Excl. in GC?    Vitals:   10/24/20 1848 10/24/20 2230  BP: (!) 192/94 (!) 166/82  Pulse: 92 (!) 116  Resp: 20 18  Temp: 99.1 F (37.3 C) 98.4 F (36.9 C)  SpO2: 100% 91%   Physical Exam Vitals and nursing Osborn reviewed.  Constitutional:      General: She is not in acute distress.    Appearance: She is  well-developed.  HENT:     Head: Normocephalic.     Right Ear: External ear normal.     Left Ear: External ear normal.     Nose: Nose normal.  Eyes:     Conjunctiva/sclera: Conjunctivae normal.  Cardiovascular:     Rate and Rhythm: Normal rate and regular rhythm.     Pulses: Normal pulses.     Heart sounds: No murmur heard. Pulmonary:     Effort: Pulmonary effort is normal. No respiratory distress.     Breath sounds: Normal breath sounds.  Abdominal:     Palpations: Abdomen is soft.  Tenderness: There is no abdominal tenderness.  Musculoskeletal:     Cervical back: Neck supple.  Skin:    General: Skin is warm and dry.  Neurological:     General: No focal deficit present.     Mental Status: She is alert. Mental status is at baseline. She is disoriented.  Psychiatric:        Mood and Affect: Mood normal.    2+ radial and DP pulses.  No tenderness step-offs or deformities over the C/C/L-spine.  PERRLA.  EOMI.  There is some ecchymosis around the right eye and a very small less than 0.25 cm linear superficial laceration over the right eyebrow without other obvious trauma to the face scalp head or neck.  There is also a skin tear over the right distal forearm with restricted right elbow.  No other obvious trauma to the bilateral upper extremities.  There is some bruising over the bilateral ankles and right knee although patient's daughter states these are from prior falls.  No significant tenderness over the bilateral shoulders, elbows, wrists, snuffbox areas, knees or ankles.  Patient is able to follow commands all extremities.  Abdomen is soft nontender.   ____________________________________________   LABS (all labs ordered are listed, but only abnormal results are displayed)  Labs Reviewed  CBC WITH DIFFERENTIAL/PLATELET - Abnormal; Notable for the following components:      Result Value   Hemoglobin 11.8 (*)    HCT 34.9 (*)    All other components within normal limits   COMPREHENSIVE METABOLIC PANEL - Abnormal; Notable for the following components:   Glucose, Bld 117 (*)    BUN 27 (*)    All other components within normal limits  PROTIME-INR - Abnormal; Notable for the following components:   Prothrombin Time 15.9 (*)    INR 1.3 (*)    All other components within normal limits  RESP PANEL BY RT-PCR (FLU A&B, COVID) [id number]   ____________________________________________  EKG  ____________________________________________  RADIOLOGY  ED MD interpretation: Chest x-ray has no evidence of rib fracture, pneumothorax or other clear acute intrathoracic process.  Pelvis x-ray shows postop changes of the right hip and femur without any acute fracture or dislocation.  Plain film of the right elbow shows no acute fracture dislocation.  Plain film of the right wrist shows previously distal right radius fracture with fingers distally on the medial aspect of the distal radius although there is no overlying tenderness to suggest acute fracture.  Stable osteoarthritis.  Plain film of the right ankle shows no clear fracture or dislocation.  Plain film of the left ankle shows no fracture dislocation.  Plain film the right knee shows no fracture dislocation.  Plain film left knee shows no fracture dislocation.  CT of the head, face and C-spine evidence of intracranial abnormality or skull fracture or acute C-spine fracture there is evidence of some maxillary sinus fractures.  Specifically patient has minimally displaced fracture of the anterior wall of the right Sinus with a halo extending to the medial and lateral aspects of the sinus well.  There is also a small hemorrhagic effusion of the right maxillary sinus but no other clear acute facial fractures.  CT C-spine in addition to showing no fracture shows some chronic appearing anterolisthesis and degenerative changes.  Official radiology report(s): DG Elbow Complete Right  Result Date: 10/24/2020 CLINICAL DATA:  Tripped and  fell, pain EXAM: RIGHT ELBOW - COMPLETE 3+ VIEW COMPARISON:  11/01/2019 FINDINGS: Frontal, bilateral oblique, and lateral views of  the right elbow are obtained. No fracture, subluxation, or dislocation. Mild osteoarthritis unchanged. No joint effusion. Soft tissues are unremarkable. IMPRESSION: 1. Mild osteoarthritis.  No acute fracture. Electronically Signed   By: Sharlet Salina M.D.   On: 10/24/2020 19:50   DG Wrist Complete Right  Result Date: 10/24/2020 CLINICAL DATA:  Tripped and fell, pain EXAM: RIGHT WRIST - COMPLETE 3+ VIEW COMPARISON:  06/24/2020 FINDINGS: Frontal, oblique, lateral, and ulnar deviated views of the right wrist are obtained. A prior distal right radial fracture is identified, with evidence of significant interval healing since the prior exam and residual impaction and slight dorsal angulation. Along the medial aspect of the distal right radius there is still faint lucency identified which could reflect residual of the previous fracture line or recurrent injury. No other acute displaced fractures are identified. Stable osteoarthritis within the radial aspect of the carpus. IMPRESSION: 1. Previous distal right radial fracture, with evidence of interval healing with chronic impaction and slight dorsal angulation. Faint lucency along the medial aspect of the distal radius could reflect a residual of prior fracture line or recurrent injury. 2. Stable osteoarthritis. Electronically Signed   By: Sharlet Salina M.D.   On: 10/24/2020 19:55   DG Ankle Complete Left  Result Date: 10/24/2020 CLINICAL DATA:  Tripped and fell, pain EXAM: LEFT ANKLE COMPLETE - 3+ VIEW COMPARISON:  None. FINDINGS: Frontal, oblique, and lateral views of the left ankle are obtained. No fracture, subluxation, or dislocation. Joint spaces are well preserved. Soft tissues are normal. IMPRESSION: 1. Unremarkable left ankle. Electronically Signed   By: Sharlet Salina M.D.   On: 10/24/2020 19:48   DG Ankle Complete  Right  Result Date: 10/24/2020 CLINICAL DATA:  Tripped and fell, pain EXAM: RIGHT ANKLE - COMPLETE 3+ VIEW COMPARISON:  None. FINDINGS: Frontal, oblique, and lateral views of the right ankle are obtained. No fracture, subluxation, or dislocation. Joint spaces are well preserved. Soft tissues are normal. IMPRESSION: 1. Unremarkable right ankle. Electronically Signed   By: Sharlet Salina M.D.   On: 10/24/2020 19:49   CT Head Wo Contrast  Result Date: 10/24/2020 CLINICAL DATA:  Head trauma. EXAM: CT HEAD WITHOUT CONTRAST TECHNIQUE: Contiguous axial images were obtained from the base of the skull through the vertex without intravenous contrast. COMPARISON:  June 25, 2020 FINDINGS: Brain: No evidence of acute infarction, hemorrhage, hydrocephalus, extra-axial collection or mass lesion/mass effect. Advanced deep white matter microvascular ischemia. Moderate brain parenchymal volume loss. Vascular: No hyperdense vessel or unexpected calcification. Skull: Normal. Negative for fracture or focal lesion. IMPRESSION: 1. No acute intracranial abnormality. 2. Advanced deep white matter microvascular ischemia. 3. Moderate brain parenchymal volume loss. Electronically Signed   By: Ted Mcalpine M.D.   On: 10/24/2020 19:56   CT Cervical Spine Wo Contrast  Result Date: 10/24/2020 CLINICAL DATA:  Neck trauma. EXAM: CT CERVICAL SPINE WITHOUT CONTRAST TECHNIQUE: Multidetector CT imaging of the cervical spine was performed without intravenous contrast. Multiplanar CT image reconstructions were also generated. COMPARISON:  June 25, 2020 FINDINGS: Alignment: 4 mm anterolisthesis of C4 on C5. 2 mm anterolisthesis of C7 on T1. Skull base and vertebrae: No acute fracture. No primary bone lesion or focal pathologic process. Soft tissues and spinal canal: No prevertebral fluid or swelling. No visible canal hematoma. Disc levels:  Osteoarthritic changes, multilevel. Upper chest: Negative. Other: None. IMPRESSION: 1. No  evidence of acute traumatic injury to the cervical spine. 2. 4 mm anterolisthesis of C4 on C5 and 2 mm anterolisthesis of C7  on T1, likely degenerative in stable. 3. Osteoarthritic changes of the cervical spine. Electronically Signed   By: Ted Mcalpineobrinka  Dimitrova M.D.   On: 10/24/2020 20:06   DG Pelvis Portable  Result Date: 10/24/2020 CLINICAL DATA:  Tripped and fell, pain EXAM: PORTABLE PELVIS 1-2 VIEWS COMPARISON:  07/31/2020 FINDINGS: Single frontal view of the pelvis was performed. Partial visualization of a right hip arthroplasty which appears well aligned. Lateral plate and cerclage wire fixation of the proximal right femur is partially visualized, consistent with prior fracture repair. Mild left hip osteoarthritis. No acute displaced fractures. Sacroiliac joints are normal. IMPRESSION: 1. Postoperative changes of the right hip and femur. 2. Mild left hip osteoarthritis. 3. No acute fracture. Electronically Signed   By: Sharlet SalinaMichael  Brown M.D.   On: 10/24/2020 19:52   DG Chest Portable 1 View  Result Date: 10/24/2020 CLINICAL DATA:  Tripped and fell EXAM: PORTABLE CHEST 1 VIEW COMPARISON:  07/31/2020 FINDINGS: Single frontal view of the chest demonstrates stable single lead pacer. The cardiac silhouette is unchanged. Stable thoracic aortic atherosclerosis. No acute airspace disease, effusion, or pneumothorax. Chronic background scarring unchanged. No acute displaced fractures. IMPRESSION: 1. Stable chest, no acute process. Electronically Signed   By: Sharlet SalinaMichael  Brown M.D.   On: 10/24/2020 19:48   DG Knee Complete 4 Views Left  Result Date: 10/24/2020 CLINICAL DATA:  Tripped and fell, pain EXAM: LEFT KNEE - COMPLETE 4+ VIEW COMPARISON:  None. FINDINGS: Frontal, bilateral oblique, and lateral views of the left knee are obtained. No fracture, subluxation, or dislocation. Mild medial compartmental joint space narrowing. No joint effusion. Diffuse atherosclerosis. IMPRESSION: 1. Mild medial compartmental  osteoarthritis. No acute bony abnormality. Electronically Signed   By: Sharlet SalinaMichael  Brown M.D.   On: 10/24/2020 19:50   DG Knee Complete 4 Views Right  Result Date: 10/24/2020 CLINICAL DATA:  Tripped and fell, pain EXAM: RIGHT KNEE - COMPLETE 4+ VIEW COMPARISON:  08/03/2020 FINDINGS: Frontal, bilateral oblique, lateral views of the right knee are obtained. No fracture, subluxation, or dislocation. Mild medial compartmental osteoarthritis. Distal margin of a plate and screw fixation traversing a prior femoral fracture is noted. Soft tissues are unremarkable. No joint effusion. IMPRESSION: 1. Mild osteoarthritis.  No acute bony abnormality. Electronically Signed   By: Sharlet SalinaMichael  Brown M.D.   On: 10/24/2020 19:57   CT Maxillofacial Wo Contrast  Result Date: 10/24/2020 CLINICAL DATA:  Status post fall. EXAM: CT MAXILLOFACIAL WITHOUT CONTRAST TECHNIQUE: Multidetector CT imaging of the maxillofacial structures was performed. Multiplanar CT image reconstructions were also generated. COMPARISON:  None. FINDINGS: Osseous: Minimally displaced fracture of the anterior wall of the right maxillary sinus, with hairline extension to the medial and lateral sinus wall. Orbits: Negative. No traumatic or inflammatory finding. Sinuses: Hemorrhagic effusion in the right maxillary sinus, and partially in the right ethmoid air cells. Soft tissues: Right-sided facial swelling. IMPRESSION: 1. Minimally displaced fracture of the anterior wall of the right maxillary sinus, with hairline extension to the medial and lateral sinus wall. 2. Hemorrhagic effusion in the right maxillary sinus, extending in the right ethmoid air cells. 3. Right-sided facial swelling. Electronically Signed   By: Ted Mcalpineobrinka  Dimitrova M.D.   On: 10/24/2020 20:01    ____________________________________________   PROCEDURES  Procedure(s) performed (including Critical Care):  Marland Kitchen.Marland Kitchen.Laceration Repair  Date/Time: 10/24/2020 10:52 PM Performed by: Gilles ChiquitoSmith, Wesly Whisenant P,  MD Authorized by: Gilles ChiquitoSmith, Matylda Fehring P, MD   Consent:    Consent obtained:  Verbal   Consent given by:  Patient   Risks discussed:  Pain Laceration details:    Length (cm):  0.3 Exploration:    Contaminated: no   Treatment:    Area cleansed with:  Saline   Amount of cleaning:  Standard   Irrigation method:  Syringe   Visualized foreign bodies/material removed: no     Debridement:  None Skin repair:    Repair method:  Tissue adhesive Approximation:    Approximation:  Close Repair type:    Repair type:  Simple Post-procedure details:    Dressing:  Open (no dressing)   Procedure completion:  Tolerated well, no immediate complications   ____________________________________________   INITIAL IMPRESSION / ASSESSMENT AND PLAN / ED COURSE     Patient presents with above-stated history and exam after witnessed ground-level mechanical fall.  On arrival she is hypertensive BP of 192/94 with otherwise stable vital signs on room air.  She has a nonfocal neuro exam and appears to be at her neurological baseline per daughter who is at bedside.  Given she is demented and on anticoagulation CT head C-spine and face obtained.  In addition given he is not a great historian to obtain a chest and pelvic x-ray as well and plain films of the extremities as noted below.   Chest x-ray has no evidence of rib fracture, pneumothorax or other clear acute intrathoracic process.  Pelvis x-ray shows postop changes of the right hip and femur without any acute fracture or dislocation.  Plain film of the right elbow shows no acute fracture dislocation.  Plain film of the right wrist shows previously distal right radius fracture with fingers distally on the medial aspect of the distal radius although there is no overlying tenderness to suggest acute fracture.  Stable osteoarthritis.  Plain film of the right ankle shows no clear fracture or dislocation.  Plain film of the left ankle shows no fracture dislocation.  Plain  film the right knee shows no fracture dislocation.  Plain film left knee shows no fracture dislocation.  CT of the head, face and C-spine evidence of intracranial abnormality or skull fracture or acute C-spine fracture there is evidence of some maxillary sinus fractures.  Specifically patient has minimally displaced fracture of the anterior wall of the right Sinus with extending to the medial and lateral aspects of the sinus well.  There is also a small hemorrhagic effusion of the right maxillary sinus but no other clear acute facial fractures.  CT C-spine in addition to showing no fracture shows some chronic appearing anterolisthesis and degenerative changes.  Small laceration over the right eyelid repaired with some glue per procedure Osborn above.  CBC without leukocytosis or acute anemia.  CMP without any significant electrode or metabolic derangement.   Injury attempt to ambulate patient emergency room but she states she feels this.  Given recurrent falls with concern the patient may need placement versus more intensive home health will admit to medicine service for further evaluation management.       ____________________________________________   FINAL CLINICAL IMPRESSION(S) / ED DIAGNOSES  Final diagnoses:  Fall, initial encounter  Closed fracture of right side of maxilla, initial encounter (HCC)  Anticoagulated    Medications  acetaminophen (OFIRMEV) IV 1,000 mg (0 mg Intravenous Stopped 10/24/20 2043)     ED Discharge Orders     None        Osborn:  This document was prepared using Dragon voice recognition software and may include unintentional dictation errors.    Gilles Chiquito, MD 10/24/20 2256

## 2020-10-24 NOTE — H&P (Signed)
History and Physical   DEICY RUSK GQQ:[id number] DOB: Dec 23, 1926 DOA: 10/24/2020  PCP: Danella Penton, MD  Outpatient Specialists: Dr. Gwen Pounds, cardiology Patient coming from: Home  I have personally briefly reviewed patient's old medical records in [facility] EMR.  Chief Concern: Frequent falls  HPI: [place] Jobson is a 87 y.o. female with medical history significant for hypertension, hyperlipidemia, atrial fibrillation on anticoagulation, history of frequent falls, history of urinary retention, suspect Alzheimer's presents emergency department for chief concerns of a fall.  Patient at baseline ambulate with a Rollator walker.  Patient was ambulating outside without her walker and at baseline her feet drags on the concrete.  Her feet caught on the concrete and she fell in front of her daughter.  She denies loss of consciousness.  She landed on the concrete floor on her right side.  At bedside, patient was able to tell me her full name.  Was not able to tell me her age or the current calendar year.  She thinks that she is in the cafeteria somewhere.  She recognizes her daughter [name] at bedside.  After discussing with her daughter [name] patient's baseline mental status.  Patient denies chest pain, shortness of breath, abdominal pain, dysuria.  She does endorse mild right-sided achiness that is worse with deep palpation.  Social history: She lives at home by herself with 3 people rotating into her home to help her.  This includes Becky.  She denies tobacco, EtOH, recreational drug use.  She is retired and formerly was a Neurosurgeon.  Vaccinations: She is vaccinated for COVID-19, with 3 doses.  [name] is unsure if it is Optometrist.   ROS: As patient has Alzheimer's dementia Constitutional: no weight change, no fever ENT/Mouth: no sore throat, no rhinorrhea Eyes: no eye pain, no vision changes Cardiovascular: no chest pain, no dyspnea,  no edema, no palpitations Respiratory:  no cough, no sputum, no wheezing Gastrointestinal: no nausea, no vomiting, no diarrhea, no constipation Genitourinary: no urinary incontinence, no dysuria, no hematuria Musculoskeletal: no arthralgias, no myalgias Skin: no skin lesions, no pruritus, Neuro: + weakness, no loss of consciousness, no syncope Psych: no anxiety, no depression, + decrease appetite Heme/Lymph: no bruising, no bleeding  ED Course: Discussed with ED, patient for chief complaints of social admit for frequent falls.  Labs in the emergency department was remarkable for temperature of 99.1, respiration rate of 20, heart rate 92, blood pressure 192/94, SPO2 of 100% on room air.  ED provider ordered Tylenol 1000 mg.  CT head without contrast was ordered and was negative for acute intracranial abnormality.  Multiple x-ray imaging was ordered, and was remarkable for a right distal radial fracture that is chronic.  New fracture included minimally displaced fracture of anterior wall of the right maxillary sinus with extension to the medial and lateral sinus wall.  Hemorrhagic effusion in the right maxillary sinus.  Assessment/Plan  Principal Problem:   Frequent falls Active Problems:   Alzheimer's dementia without behavioral disturbance (HCC)   Atrial fibrillation and flutter (HCC)   Benign essential HTN   PAD (peripheral artery disease) (HCC)   Status post placement of cardiac pacemaker   Chronic anticoagulation   Closed displaced fracture of right femoral neck (HCC)   Distal radius fracture, right   Protein-calorie malnutrition, severe   Closed left hip fracture (HCC)   # Frequent falls-family felt that it was unsafe to take her home - Patient felt weak and is afraid of ambulating - MedSurg, observation -  PT OT ordered - Fall precautions - Extensive counseling advising patient to ambulate with a walker -Toradol 15 mg IV every 6 hours as needed for moderate and severe pain, 1 day ordered  # Atrial  fibrillation-given that there is hemorrhagic collection in the right maxillary sinus, Eliquis 2.5 mg has been held at this time - Patient will need consideration discussion regarding frequent falls in a patient on Eliquis 2.5 mg twice daily - This increases her risk of falling and developing a hemorrhagic in her brain  # Hypertension-elevated suspect secondary to acute pain - Losartan 25 mg daily resumed - Hydralazine 25 mg every 6 hours as needed for SBP greater than 180 - Patient's home dosing of metoprolol tartrate is 25 mg every 6 hours as needed-med reconciliation needs completion especially regarding metoprolol dosing  # GERD-PPI, pantoprazole 40 mg # Insomnia-quetiapine 25 mg nightly # Protein/calorie malnutrition-Ensure with meals ordered  # DVT prophylaxis-I have not ordered pharmacologic DVT prophylaxis as patient took Eliquis 2.5 mg in the a.m. and presented with right maxillary sinus hemorrhage -A.m. team to initiate pharmacologic DVT prophylaxis when appropriate  # COVID PCR/influenza A/influenza B pending-continue airborne and contact precaution  A.m. team to complete med reconciliation  Chart reviewed.   DVT prophylaxis: TED hose Code Status: DNR Diet: Heart healthy Family Communication: Updated daughter [name] at bedside Disposition Plan: Pending clinical course Consults called: None at this time Admission status: Observation, MedSurg, no telemetry  Past Medical History:  Diagnosis Date   Atrial fibrillation (HCC)    CHF (congestive heart failure) (HCC)    Dementia (HCC)    Past Surgical History:  Procedure Laterality Date   ABDOMINAL HYSTERECTOMY     CLOSED REDUCTION WRIST FRACTURE Right 06/28/2020   Procedure: CLOSED REDUCTION WRIST;  Surgeon: Lyndle Herrlich, MD;  Location: [facility] ORS;  Service: Orthopedics;  Laterality: Right;   HIP ARTHROPLASTY Right 06/28/2020   Procedure: ARTHROPLASTY BIPOLAR HIP (HEMIARTHROPLASTY);  Surgeon: Lyndle Herrlich, MD;  Location:  [facility] ORS;  Service: Orthopedics;  Laterality: Right;   ORIF PERIPROSTHETIC FRACTURE Right 08/03/2020   Procedure: OPEN REDUCTION INTERNAL FIXATION (ORIF) PERIPROSTHETIC FRACTURE;  Surgeon: Lyndle Herrlich, MD;  Location: [facility] ORS;  Service: Orthopedics;  Laterality: Right;   Social History:  reports that she has never smoked. She has never used smokeless tobacco. She reports previous alcohol use. She reports that she does not use drugs.  Allergies  Allergen Reactions   Lisinopril Other (See Comments)   History reviewed. No pertinent family history. Family history: Family history reviewed and not pertinent  Prior to Admission medications   Medication Sig Start Date End Date Taking? Authorizing Provider  apixaban (ELIQUIS) 2.5 MG TABS tablet Take 1 tablet (2.5 mg total) by mouth 2 (two) times daily. 07/06/20   Arnetha Courser, MD  bisacodyl (DULCOLAX) 5 MG EC tablet Take 2 tablets (10 mg total) by mouth daily as needed for moderate constipation. 08/06/20   Gillis Santa, MD  Cholecalciferol (VITAMIN D) 125 MCG (5000 UT) CAPS Take 1 capsule by mouth daily. 08/06/20   Gillis Santa, MD  docusate sodium (COLACE) 100 MG capsule Take 1 capsule (100 mg total) by mouth 2 (two) times daily. 07/06/20   Arnetha Courser, MD  feeding supplement (ENSURE ENLIVE / ENSURE PLUS) LIQD Take 237 mLs by mouth 3 (three) times daily between meals. 07/06/20   Arnetha Courser, MD  ferrous sulfate 325 (65 FE) MG tablet Take 1 tablet (325 mg total) by mouth daily with breakfast. 08/06/20  Gillis Santa, MD  liver oil-zinc oxide (DESITIN) 40 % ointment Apply topically as needed for irritation. 07/06/20   Arnetha Courser, MD  losartan (COZAAR) 25 MG tablet Take 1 tablet (25 mg total) by mouth daily. Skip dose if SBP <120 mmHg 08/06/20   Gillis Santa, MD  metoprolol tartrate (LOPRESSOR) 25 MG tablet Take 1 tablet (25 mg total) by mouth every 6 (six) hours. Skip dose if SBP <110 mmHg 08/06/20   Gillis Santa, MD  Multiple Vitamin  (MULTIVITAMIN WITH MINERALS) TABS tablet Take 1 tablet by mouth daily. 07/06/20   Arnetha Courser, MD  nystatin cream (MYCOSTATIN) Apply topically 2 (two) times daily. 07/13/20   [provider]  omeprazole (PRILOSEC) 20 MG capsule Take 20 mg by mouth daily. 04/17/20   [provider]  polyethylene glycol (MIRALAX / GLYCOLAX) 17 g packet Take 17 g by mouth daily as needed for mild constipation or moderate constipation. 07/06/20   Arnetha Courser, MD  potassium chloride (KLOR-CON) 8 MEQ tablet potassium chloride ER 8 mEq tablet,extended release  TAKE 1 TABLET BY MOUTH ONCE DAILY    [provider]  QUEtiapine (SEROQUEL) 25 MG tablet Take 25 mg by mouth daily. Takes at 5 pm 06/16/20   [provider]  rivastigmine (EXELON) 1.5 MG capsule rivastigmine 1.5 mg capsule    [provider]   Physical Exam: Vitals:   10/24/20 1848 10/24/20 1849 10/24/20 2230  BP: (!) 192/94  (!) 166/82  Pulse: 92  (!) 116  Resp: 20  18  Temp: 99.1 F (37.3 C)  98.4 F (36.9 C)  TempSrc: Oral  Oral  SpO2: 100%  91%  Weight:  49.9 kg   Height:   (1.727 m)    Constitutional: appears age appropriate, fail, cachectic, NAD, calm, comfortable Eyes: PERRL, lids and conjunctivae normal ENMT: Mucous membranes are moist. Posterior pharynx clear of any exudate or lesions. Age-appropriate dentition. Hearing appropriate Neck: normal, supple, no masses, no thyromegaly Respiratory: clear to auscultation bilaterally, no wheezing, no crackles. Normal respiratory effort. No accessory muscle use.  Cardiovascular: Regular rate and rhythm, no murmurs / rubs / gallops. No extremity edema. 2+ pedal pulses. No carotid bruits.  Abdomen: no tenderness, no masses palpated, no hepatosplenomegaly. Bowel sounds positive.  Musculoskeletal: no clubbing / cyanosis. No joint deformity upper and lower extremities. Good ROM, no contractures, no atrophy. Normal muscle tone.  Skin: no rashes, lesions, ulcers.  No induration. Thinned skin with multiple small ecchymosis Neurologic: Sensation intact. Strength 5/5 in all 4.  Psychiatric: Normal judgment and insight. Alert and oriented x 3. Normal mood.   EKG: ordered and pending  Chest x-ray on Admission: I personally reviewed and I agree with radiologist reading as below.  DG Elbow Complete Right  Result Date: 10/24/2020 CLINICAL DATA:  Tripped and fell, pain EXAM: RIGHT ELBOW - COMPLETE 3+ VIEW COMPARISON:  11/01/2019 FINDINGS: Frontal, bilateral oblique, and lateral views of the right elbow are obtained. No fracture, subluxation, or dislocation. Mild osteoarthritis unchanged. No joint effusion. Soft tissues are unremarkable. IMPRESSION: 1. Mild osteoarthritis.  No acute fracture. Electronically Signed   By: Sharlet Salina M.D.   On: 10/24/2020 19:50   DG Wrist Complete Right  Result Date: 10/24/2020 CLINICAL DATA:  Tripped and fell, pain EXAM: RIGHT WRIST - COMPLETE 3+ VIEW COMPARISON:  06/24/2020 FINDINGS: Frontal, oblique, lateral, and ulnar deviated views of the right wrist are obtained. A prior distal right radial fracture is identified, with evidence of significant interval healing since the  prior exam and residual impaction and slight dorsal angulation. Along the medial aspect of the distal right radius there is still faint lucency identified which could reflect residual of the previous fracture line or recurrent injury. No other acute displaced fractures are identified. Stable osteoarthritis within the radial aspect of the carpus. IMPRESSION: 1. Previous distal right radial fracture, with evidence of interval healing with chronic impaction and slight dorsal angulation. Faint lucency along the medial aspect of the distal radius could reflect a residual of prior fracture line or recurrent injury. 2. Stable osteoarthritis. Electronically Signed   By: Sharlet SalinaMichael  Brown M.D.   On: 10/24/2020 19:55   DG Ankle Complete Left  Result Date: 10/24/2020 CLINICAL  DATA:  Tripped and fell, pain EXAM: LEFT ANKLE COMPLETE - 3+ VIEW COMPARISON:  None. FINDINGS: Frontal, oblique, and lateral views of the left ankle are obtained. No fracture, subluxation, or dislocation. Joint spaces are well preserved. Soft tissues are normal. IMPRESSION: 1. Unremarkable left ankle. Electronically Signed   By: Sharlet SalinaMichael  Brown M.D.   On: 10/24/2020 19:48   DG Ankle Complete Right  Result Date: 10/24/2020 CLINICAL DATA:  Tripped and fell, pain EXAM: RIGHT ANKLE - COMPLETE 3+ VIEW COMPARISON:  None. FINDINGS: Frontal, oblique, and lateral views of the right ankle are obtained. No fracture, subluxation, or dislocation. Joint spaces are well preserved. Soft tissues are normal. IMPRESSION: 1. Unremarkable right ankle. Electronically Signed   By: Sharlet SalinaMichael  Brown M.D.   On: 10/24/2020 19:49   CT Head Wo Contrast  Result Date: 10/24/2020 CLINICAL DATA:  Head trauma. EXAM: CT HEAD WITHOUT CONTRAST TECHNIQUE: Contiguous axial images were obtained from the base of the skull through the vertex without intravenous contrast. COMPARISON:  June 25, 2020 FINDINGS: Brain: No evidence of acute infarction, hemorrhage, hydrocephalus, extra-axial collection or mass lesion/mass effect. Advanced deep white matter microvascular ischemia. Moderate brain parenchymal volume loss. Vascular: No hyperdense vessel or unexpected calcification. Skull: Normal. Negative for fracture or focal lesion. IMPRESSION: 1. No acute intracranial abnormality. 2. Advanced deep white matter microvascular ischemia. 3. Moderate brain parenchymal volume loss. Electronically Signed   By: Ted Mcalpineobrinka  Dimitrova M.D.   On: 10/24/2020 19:56   CT Cervical Spine Wo Contrast  Result Date: 10/24/2020 CLINICAL DATA:  Neck trauma. EXAM: CT CERVICAL SPINE WITHOUT CONTRAST TECHNIQUE: Multidetector CT imaging of the cervical spine was performed without intravenous contrast. Multiplanar CT image reconstructions were also generated. COMPARISON:   June 25, 2020 FINDINGS: Alignment: 4 mm anterolisthesis of C4 on C5. 2 mm anterolisthesis of C7 on T1. Skull base and vertebrae: No acute fracture. No primary bone lesion or focal pathologic process. Soft tissues and spinal canal: No prevertebral fluid or swelling. No visible canal hematoma. Disc levels:  Osteoarthritic changes, multilevel. Upper chest: Negative. Other: None. IMPRESSION: 1. No evidence of acute traumatic injury to the cervical spine. 2. 4 mm anterolisthesis of C4 on C5 and 2 mm anterolisthesis of C7 on T1, likely degenerative in stable. 3. Osteoarthritic changes of the cervical spine. Electronically Signed   By: Ted Mcalpineobrinka  Dimitrova M.D.   On: 10/24/2020 20:06   DG Pelvis Portable  Result Date: 10/24/2020 CLINICAL DATA:  Tripped and fell, pain EXAM: PORTABLE PELVIS 1-2 VIEWS COMPARISON:  07/31/2020 FINDINGS: Single frontal view of the pelvis was performed. Partial visualization of a right hip arthroplasty which appears well aligned. Lateral plate and cerclage wire fixation of the proximal right femur is partially visualized, consistent with prior fracture repair. Mild left hip osteoarthritis. No acute displaced fractures. Sacroiliac  joints are normal. IMPRESSION: 1. Postoperative changes of the right hip and femur. 2. Mild left hip osteoarthritis. 3. No acute fracture. Electronically Signed   By: Sharlet Salina M.D.   On: 10/24/2020 19:52   DG Chest Portable 1 View  Result Date: 10/24/2020 CLINICAL DATA:  Tripped and fell EXAM: PORTABLE CHEST 1 VIEW COMPARISON:  07/31/2020 FINDINGS: Single frontal view of the chest demonstrates stable single lead pacer. The cardiac silhouette is unchanged. Stable thoracic aortic atherosclerosis. No acute airspace disease, effusion, or pneumothorax. Chronic background scarring unchanged. No acute displaced fractures. IMPRESSION: 1. Stable chest, no acute process. Electronically Signed   By: Sharlet Salina M.D.   On: 10/24/2020 19:48   DG Knee Complete 4  Views Left  Result Date: 10/24/2020 CLINICAL DATA:  Tripped and fell, pain EXAM: LEFT KNEE - COMPLETE 4+ VIEW COMPARISON:  None. FINDINGS: Frontal, bilateral oblique, and lateral views of the left knee are obtained. No fracture, subluxation, or dislocation. Mild medial compartmental joint space narrowing. No joint effusion. Diffuse atherosclerosis. IMPRESSION: 1. Mild medial compartmental osteoarthritis. No acute bony abnormality. Electronically Signed   By: Sharlet Salina M.D.   On: 10/24/2020 19:50   DG Knee Complete 4 Views Right  Result Date: 10/24/2020 CLINICAL DATA:  Tripped and fell, pain EXAM: RIGHT KNEE - COMPLETE 4+ VIEW COMPARISON:  08/03/2020 FINDINGS: Frontal, bilateral oblique, lateral views of the right knee are obtained. No fracture, subluxation, or dislocation. Mild medial compartmental osteoarthritis. Distal margin of a plate and screw fixation traversing a prior femoral fracture is noted. Soft tissues are unremarkable. No joint effusion. IMPRESSION: 1. Mild osteoarthritis.  No acute bony abnormality. Electronically Signed   By: Sharlet Salina M.D.   On: 10/24/2020 19:57   CT Maxillofacial Wo Contrast  Result Date: 10/24/2020 CLINICAL DATA:  Status post fall. EXAM: CT MAXILLOFACIAL WITHOUT CONTRAST TECHNIQUE: Multidetector CT imaging of the maxillofacial structures was performed. Multiplanar CT image reconstructions were also generated. COMPARISON:  None. FINDINGS: Osseous: Minimally displaced fracture of the anterior wall of the right maxillary sinus, with hairline extension to the medial and lateral sinus wall. Orbits: Negative. No traumatic or inflammatory finding. Sinuses: Hemorrhagic effusion in the right maxillary sinus, and partially in the right ethmoid air cells. Soft tissues: Right-sided facial swelling. IMPRESSION: 1. Minimally displaced fracture of the anterior wall of the right maxillary sinus, with hairline extension to the medial and lateral sinus wall. 2. Hemorrhagic  effusion in the right maxillary sinus, extending in the right ethmoid air cells. 3. Right-sided facial swelling. Electronically Signed   By: Ted Mcalpine M.D.   On: 10/24/2020 20:01    Labs on Admission: I have personally reviewed following labs  CBC: Recent Labs  Lab 10/24/20 2015  WBC 9.2  NEUTROABS 7.2  HGB 11.8*  HCT 34.9*  MCV 87.0  PLT 179   Basic Metabolic Panel: Recent Labs  Lab 10/24/20 2015  NA 136  K 3.9  CL 103  CO2 28  GLUCOSE 117*  BUN 27*  CREATININE 0.88  CALCIUM 8.9   GFR: Estimated Creatinine Clearance: 32.8 mL/min (by C-G formula based on SCr of 0.88 mg/dL). Liver Function Tests: Recent Labs  Lab 10/24/20 2015  AST 36  ALT 27  ALKPHOS 108  BILITOT 1.0  PROT 7.1  ALBUMIN 3.8   Coagulation Profile: Recent Labs  Lab 10/24/20 2015  INR 1.3*   Urine analysis:    Component Value Date/Time   COLORURINE YELLOW (A) 09/20/2020 2115   APPEARANCEUR TURBID (A) 09/20/2020  2115   APPEARANCEUR Hazy 08/05/2011 1551   LABSPEC 1.015 09/20/2020 2115   LABSPEC 1.014 08/05/2011 1551   PHURINE 7.0 09/20/2020 2115   GLUCOSEU NEGATIVE 09/20/2020 2115   GLUCOSEU Negative 08/05/2011 1551   HGBUR LARGE (A) 09/20/2020 2115   BILIRUBINUR NEGATIVE 09/20/2020 2115   BILIRUBINUR Negative 08/05/2011 1551   KETONESUR NEGATIVE 09/20/2020 2115   PROTEINUR 100 (A) 09/20/2020 2115   NITRITE NEGATIVE 09/20/2020 2115   LEUKOCYTESUR MODERATE (A) 09/20/2020 2115   LEUKOCYTESUR 3+ 08/05/2011 1551   Ronisha Herringshaw N Akeia Perot D.O. [facility]  If 7PM-7AM, please contact overnight-coverage provider If 7AM-7PM, please contact day coverage provider [email or web address]  10/24/2020, 10:56 PM

## 2020-10-25 DIAGNOSIS — R296 Repeated falls: Secondary | ICD-10-CM | POA: Diagnosis not present

## 2020-10-25 LAB — CBC
HCT: 31.5 % — ABNORMAL LOW (ref 36.0–46.0)
Hemoglobin: 10.3 g/dL — ABNORMAL LOW (ref 12.0–15.0)
MCH: 28.4 pg (ref 26.0–34.0)
MCHC: 32.7 g/dL (ref 30.0–36.0)
MCV: 86.8 fL (ref 80.0–100.0)
Platelets: 190 10*3/uL (ref 150–400)
RBC: 3.63 MIL/uL — ABNORMAL LOW (ref 3.87–5.11)
RDW: 14.7 % (ref 11.5–15.5)
WBC: 10.9 10*3/uL — ABNORMAL HIGH (ref 4.0–10.5)
nRBC: 0 % (ref 0.0–0.2)

## 2020-10-25 LAB — TSH: TSH: 2 u[IU]/mL (ref 0.350–4.500)

## 2020-10-25 LAB — BASIC METABOLIC PANEL
Anion gap: 11 (ref 5–15)
BUN: 22 mg/dL (ref 8–23)
CO2: 25 mmol/L (ref 22–32)
Calcium: 8.9 mg/dL (ref 8.9–10.3)
Chloride: 100 mmol/L (ref 98–111)
Creatinine, Ser: 0.76 mg/dL (ref 0.44–1.00)
GFR, Estimated: >60 mL/min (ref >60)
Glucose, Bld: 160 mg/dL — ABNORMAL HIGH (ref 70–99)
Potassium: 3.5 mmol/L (ref 3.5–5.1)
Sodium: 136 mmol/L (ref 135–145)

## 2020-10-25 MED ORDER — LACTATED RINGERS IV SOLN: INTRAVENOUS | Status: DC

## 2020-10-25 MED ORDER — METOPROLOL TARTRATE 25 MG PO TABS: 12.5000 mg | ORAL_TABLET | Freq: Two times a day (BID) | ORAL | Status: DC

## 2020-10-25 MED ORDER — DOCUSATE SODIUM 100 MG PO CAPS: 100.0000 mg | ORAL_CAPSULE | Freq: Two times a day (BID) | ORAL | 0 refills | Status: DC

## 2020-10-25 MED ORDER — FERROUS SULFATE 325 (65 FE) MG PO TABS: 325.0000 mg | ORAL_TABLET | Freq: Every day | ORAL | 3 refills | Status: AC

## 2020-10-25 NOTE — Evaluation (Signed)
Occupational Therapy Evaluation Patient Details Name: Kristen Osborn MRN: [id number] DOB: 1927-01-21 Today's Date: 10/25/2020    History of Present Illness Per MD note, pt is a 87 y.o. female who presents to emergency department for chief concerns of a fall and minimally displaced fracture of anterior wall of the right maxillary sinus . PMH inlcudes hypertension, atrial fibrillation on anticoagulation, pacemaker, CHF, PAD, history of frequent falls,and Alzheimer's dementia.   Clinical Impression   Kristen Osborn received in ED, pleasantly confused, oriented only to self, denies pain. Extensive bruising to R side of face, bleeding from ORIF incision site, RLE. Pt has no recall of fall. Pt is cared for at home by her 3 daughters, with one of them being with her 24 hrs/day. Fall that led to this ED visit is at least 4th fall in previous 3 months. Discussed fall previous strategies with daughter, including incontinence mgmt, as, per daughter, pt frequently tries to get out of bed during the night 2/2 incontinence. Given pt's frequent falls, malnutrition/low BMI, advanced Alzheimers, pt a good candidate for hospice care. Discussed with daughter, who states she would like pt to be seen for hospice evaluation.     Follow Up Recommendations  No OT follow up    Equipment Recommendations  None recommended by OT    Recommendations for Other Services Other (comment) (hospice consult)     Precautions / Restrictions Precautions Precautions: Fall Restrictions Weight Bearing Restrictions: No      Mobility Bed Mobility Overal bed mobility: Needs Assistance Bed Mobility: Supine to Sit;Sit to Supine     Supine to sit: Min assist Sit to supine: Mod assist;+2 for physical assistance        Transfers Overall transfer level: Needs assistance Equipment used: Rolling walker (2 wheeled) Transfers: Sit to/from Stand Sit to Stand: Min assist;+2 physical assistance         General transfer comment:  Verbal and tactile cues for hand placement on walker, no evidence of carry-over    Balance Overall balance assessment: Needs assistance Sitting-balance support: Feet supported Sitting balance-Leahy Scale: Fair   Postural control: Right lateral lean Standing balance support: Bilateral upper extremity supported Standing balance-Leahy Scale: Fair                             ADL either performed or assessed with clinical judgement   ADL Overall ADL's : Needs assistance/impaired                                             Vision Patient Visual Report: No change from baseline       Perception     Praxis      Pertinent Vitals/Pain Pain Assessment: No/denies pain     Hand Dominance Right   Extremity/Trunk Assessment Upper Extremity Assessment Upper Extremity Assessment: Generalized weakness   Lower Extremity Assessment Lower Extremity Assessment: Generalized weakness   Cervical / Trunk Assessment Cervical / Trunk Assessment: Normal   Communication Communication Communication: No difficulties   Cognition Arousal/Alertness: Awake/alert Behavior During Therapy: WFL for tasks assessed/performed Overall Cognitive Status: History of cognitive impairments - at baseline                                 General Comments: Pt pleasantly confused,  oriented to self only   General Comments       Exercises Other Exercises Other Exercises: Ambulation reccomendation of contact guard assist discussed with daughter. Other Exercises: Educ re: falls prevention, hospice services   Shoulder Instructions      Home Living Family/patient expects to be discharged to:: Private residence Living Arrangements: Children Available Help at Discharge: Family;Available 24 hours/day Type of Home: House Home Access: Stairs to enter [employer] of Steps: 2 steps with R railing into home (then has 3 steps with R railing to kitchen but can stay  on one level) Entrance Stairs-Rails: Right Home Layout: One level     Bathroom Shower/Tub: Producer, television/film/video: Standard     Home Equipment: Environmental consultant - 2 wheels;Walker - standard;Bedside commode;Shower seat - built in;Grab bars - tub/shower;Wheelchair - manual          Prior Functioning/Environment Level of Independence: Needs assistance  Gait / Transfers Assistance Needed: SBA with RW walking short household distances ADL's / Homemaking Assistance Needed: Needs assistance with all ADL and IADL   Comments: Pt's home health PT services ended just prior to this fall and family requested further home health therapy.        OT Problem List: Decreased strength;Decreased range of motion;Decreased activity tolerance;Impaired balance (sitting and/or standing);Decreased safety awareness;Decreased cognition      OT Treatment/Interventions:      OT Goals(Current goals can be found in the care plan section) Acute Rehab OT Goals Patient Stated Goal: to go home OT Goal Formulation: With patient/family Time For Goal Achievement: 11/08/20 Potential to Achieve Goals: Good  OT Frequency:     Barriers to D/C:            Co-evaluation              AM-PAC OT "6 Clicks" Daily Activity     Outcome Measure Help from another person eating meals?: A Little Help from another person taking care of personal grooming?: A Little Help from another person toileting, which includes using toliet, bedpan, or urinal?: A Lot Help from another person bathing (including washing, rinsing, drying)?: A Lot Help from another person to put on and taking off regular upper body clothing?: A Little Help from another person to put on and taking off regular lower body clothing?: A Lot 6 Click Score: 15   End of Session Equipment Utilized During Treatment: Rolling walker  Activity Tolerance: Patient tolerated treatment well Patient left: in bed;with family/visitor present;with call bell/phone  within reach;Other (comment) (in hallway of ED)  OT Visit Diagnosis: Unsteadiness on feet (R26.81);History of falling (Z91.81);Muscle weakness (generalized) (M62.81);Repeated falls (R29.6);Adult, failure to thrive (R62.7);Other symptoms and signs involving cognitive function                Time: 0786-7544 OT Time Calculation (min): 13 min Charges:  OT General Charges $OT Visit: 1 Visit OT Evaluation $OT Eval Low Complexity: 1 Low OT Treatments $Self Care/Home Management : 8-22 mins Latina Craver, PhD, MS, OTR/L 10/25/20, 12:35 PM

## 2020-10-25 NOTE — ED Notes (Signed)
Per night nurse, pt walked self to hall bathroom with walker and did not fall. Daughter of pt told this nurse and night nurse that they want to discharge and have PT consult at home rather than in hospital. Attending provider informed of family wishes for discharge.

## 2020-10-25 NOTE — ED Notes (Signed)
Pt walking in hall with PT and walker.

## 2020-10-25 NOTE — ED Notes (Addendum)
Pt refused morning blood work.

## 2020-10-25 NOTE — ED Notes (Signed)
Pt ambulated using walked to bathroom w/steady gait.

## 2020-10-25 NOTE — ED Notes (Signed)
Dr Nelson Chimes will come see pt before discharge. PT on way to see pt.

## 2020-10-25 NOTE — Discharge Instructions (Signed)
Please follow-up with ENT as an outpatient

## 2020-10-25 NOTE — Evaluation (Signed)
Physical Therapy Evaluation Patient Details Name: Kristen Osborn MRN: [id number] DOB: 1926/10/21 Today's Date: 10/25/2020   History of Present Illness  Per MD note, pt is a 87 y.o. female who presents to emergency department for chief concerns of a fall and minimally displaced fracture of anterior wall of the right maxillary sinus . PMH inlcudes hypertension, atrial fibrillation on anticoagulation, pacemaker, CHF, PAD, history of frequent falls,and Alzheimer's dementia.  Clinical Impression  Pt was pleasant, confused secondary to dementia, and required some encouragement to participate secondary to fear of falling. Pt requires physical assistance for all functional mobility likely due at least in part to confusion. Pt ambulated very slowly with short step length and required frequent assistance guiding the walker to avoid obstacles. Pt's daughter reports that pt is walking much slower compared to baseline but stated she feels safe to manage pt at home. Pt will benefit from HHPT upon discharge to safely address deficits listed in patient problem list for decreased caregiver assistance and eventual return to PLOF.      Follow Up Recommendations Home health PT;Supervision/Assistance - 24 hour    Equipment Recommendations  None recommended by PT    Recommendations for Other Services       Precautions / Restrictions Precautions Precautions: Fall Restrictions Weight Bearing Restrictions: No      Mobility  Bed Mobility Overal bed mobility: Needs Assistance Bed Mobility: Supine to Sit;Sit to Supine     Supine to sit: Min assist (assist for LEs) Sit to supine: Mod assist( assist for LEs)        Transfers Overall transfer level: Needs assistance Equipment used: Rolling walker (2 wheeled) Transfers: Sit to/from Stand Sit to Stand: Min assist         General transfer comment: Verbal and tactile cues for hand placement on walker.  Ambulation/Gait Ambulation/Gait assistance:  Min assist Gait Distance (Feet): 30 Feet Assistive device: Rolling walker (2 wheeled) Gait Pattern/deviations: Decreased stride length;Step-to pattern;Decreased step length - left;Decreased step length - right Gait velocity: decreased   General Gait Details: Frequent encouragment required secondary to pt's fear of falling. Physical assitance turning and guiding walker to avoid obstacles.  Stairs            Wheelchair Mobility    Modified Rankin (Stroke Patients Only)       Balance Overall balance assessment: Needs assistance Sitting-balance support: Feet supported Sitting balance-Leahy Scale: Fair   Postural control: Right lateral lean Standing balance support: Bilateral upper extremity supported Standing balance-Leahy Scale: Fair                               Pertinent Vitals/Pain Pain Assessment: No/denies pain    Home Living Family/patient expects to be discharged to:: Private residence Living Arrangements: Children Available Help at Discharge: Family;Available 24 hours/day Type of Home: House Home Access: Stairs to enter Entrance Stairs-Rails: Right Entrance Stairs-Number of Steps: 2 steps with R railing into home (then has 3 steps with R railing to kitchen but can stay on one level) Home Layout: One level Home Equipment: Walker - 2 wheels;Walker - standard;Bedside commode;Shower seat - built in;Grab bars - tub/shower;Wheelchair - manual      Prior Function Level of Independence: Needs assistance   Gait / Transfers Assistance Needed: SBA with RW walking short household distances  ADL's / Homemaking Assistance Needed: Needs assistance with all ADLs  Comments: Pt's home health PT services ended just prior to this fall  and family requested further home health therapy.     Hand Dominance   Dominant Hand: Right    Extremity/Trunk Assessment   Upper Extremity Assessment Upper Extremity Assessment: Defer to OT evaluation    Lower Extremity  Assessment Lower Extremity Assessment: Generalized weakness    Cervical / Trunk Assessment Cervical / Trunk Assessment: Normal  Communication   Communication: No difficulties  Cognition Arousal/Alertness: Awake/alert Behavior During Therapy: WFL for tasks assessed/performed Overall Cognitive Status: History of cognitive impairments - at baseline                                 General Comments: AOx1; pt knew her name, pt history provided by daughter who was present during session      General Comments      Exercises Other Exercises Other Exercises: Ambulation reccomendation of contact guard assist secondary to generalized weakness discussed with daughter.   Assessment/Plan    PT Assessment Patient needs continued PT services  PT Problem List Decreased strength;Decreased activity tolerance;Decreased balance;Decreased mobility;Decreased cognition;Decreased knowledge of use of DME;Decreased safety awareness       PT Treatment Interventions DME instruction;Gait training;Stair training;Functional mobility training;Therapeutic activities;Therapeutic exercise;Balance training;Patient/family education    PT Goals (Current goals can be found in the Care Plan section)  Acute Rehab PT Goals PT Goal Formulation: Patient unable to participate in goal setting Time For Goal Achievement: 11/08/20 Potential to Achieve Goals: Good    Frequency Min 2X/week   Barriers to discharge        Co-evaluation               AM-PAC PT "6 Clicks" Mobility  Outcome Measure Help needed turning from your back to your side while in a flat bed without using bedrails?: A Little Help needed moving from lying on your back to sitting on the side of a flat bed without using bedrails?: A Little Help needed moving to and from a bed to a chair (including a wheelchair)?: A Little Help needed standing up from a chair using your arms (e.g., wheelchair or bedside chair)?: A Little Help needed  to walk in hospital room?: A Little Help needed climbing 3-5 steps with a railing? : A Lot 6 Click Score: 17    End of Session Equipment Utilized During Treatment: Gait belt Activity Tolerance: Patient tolerated treatment well Patient left: in bed;with family/visitor present Nurse Communication: Mobility status PT Visit Diagnosis: Unsteadiness on feet (R26.81);Muscle weakness (generalized) (M62.81);History of falling (Z91.81);Difficulty in walking, not elsewhere classified (R26.2)    Time: 9892-1194 PT Time Calculation (min) (ACUTE ONLY): 19 min   Charges:             Desiree Hane SPT 10/25/20, 10:21 AM

## 2020-10-25 NOTE — TOC Transition Note (Addendum)
Transition of Care [facility]) - CM/SW Discharge Note   Patient Details  Name: Kristen Osborn MRN: [id number] Date of Birth: July 12, 1926  [facility] of [facility] CM/SW Contact:  Marina Goodell Phone Number: [phone] 10/25/2020, 12:37 PM   Clinical Narrative:     Patient will d/c home with home health, PT/OT.  Patient's main contact is her daughter Kristen Osborn [phone].  Patient is currently active with [facility] agency. Patient has scheduled appointment with her PCP, Dr. Hyacinth Meeker on Tuesday 10/27/2020.  Final next level of care: Home w Home Health Services Barriers to Discharge: ED No Barriers   Patient Goals and CMS Choice Patient states their goals for this hospitalization and ongoing recovery are:: Patietn will return home.  Daughters are primary care givers.   Choice offered to / list presented to : Adult Children Kristen Osborn Daughter [phone]   [phone])  Discharge Placement                       Discharge Plan and Services In-house Referral: Clinical Social Work   Post Acute Care Choice: Home Health                    HH Arranged: PT, OT          Social Determinants of Health (SDOH) Interventions     Readmission Risk Interventions Readmission Risk Prevention Plan 08/05/2020 08/04/2020  Transportation Screening Complete Complete  PCP or Specialist Appt within 3-5 Days Complete Complete  HRI or Home Care Consult Complete Complete  Social Work Consult for Recovery Care Planning/Counseling Complete Complete  Palliative Care Screening Not Applicable Not Applicable  Medication Review Oceanographer) Complete Complete  Some recent data might be hidden

## 2020-10-25 NOTE — Discharge Summary (Signed)
Physician Discharge Summary  Kristen Osborn ZOX:[id number] DOB: 03-20-1927 DOA: 10/24/2020  PCP: Danella Penton, MD  Admit date: 10/24/2020 Discharge date: 10/25/2020  Admitted From: Home Disposition: Home  Recommendations for Outpatient Follow-up:  Follow up with PCP in 1-2 weeks Please obtain BMP/CBC in one week Please follow up on the following pending results: None  Home Health: Yes Equipment/Devices: Rolling walker Discharge Condition: Stable CODE STATUS: DNR Diet recommendation: Heart Healthy   Brief/Interim Summary: Kristen Osborn is a 87 y.o. female with medical history significant for hypertension, hyperlipidemia, atrial fibrillation on anticoagulation, history of frequent falls, history of urinary retention, suspect Alzheimer's presents emergency department for chief concerns of a fall.   Patient at baseline ambulate with a Rollator walker.  Patient was ambulating outside without her walker and at baseline her feet drags on the concrete.  Her feet caught on the concrete and she fell in front of her daughter.  She denies loss of consciousness.  She landed on the concrete floor on her right side.  Imaging was only positive for a nondisplaced right maxillary sinus fracture.  Patient experiences extensive bruising involving the right side of the face, right upper and lower extremities.  Rest of the imaging was negative for any acute fractures. Patient remained at baseline.  She has advanced dementia and oriented to self only.  Her 2 daughters helped her and provide 24/7 care.  They would like to take her back home with home health services which were arranged.  Patient is on Eliquis for her atrial fibrillation.  We advised to hold Eliquis for next couple of days and she needs a discussion from her cardiologist in terms of stopping anticoagulation as she is high risk for fall and bleeding.  Patient will continue the rest of her home medications and follow-up with her  providers.  Discharge Diagnoses:  Principal Problem:   Frequent falls Active Problems:   Alzheimer's dementia without behavioral disturbance (HCC)   Atrial fibrillation and flutter (HCC)   Benign essential HTN   PAD (peripheral artery disease) (HCC)   Status post placement of cardiac pacemaker   Chronic anticoagulation   Closed displaced fracture of right femoral neck (HCC)   Distal radius fracture, right   Protein-calorie malnutrition, severe   Closed left hip fracture [facility])   Discharge Instructions  Discharge Instructions     Diet - low sodium heart healthy   Complete by: As directed    Discharge instructions   Complete by: As directed    It was pleasure taking care of you. Please hold your Eliquis for the next couple of days. Please discussed with your doctor who is prescribing Eliquis regarding the continuation of her as you are high risk for falls and subsequent bleeding. Keep your wounds clean and dry. Keep yourself well-hydrated   Increase activity slowly   Complete by: As directed    No dressing needed   Complete by: As directed       Allergies as of 10/25/2020       Reactions   Lisinopril Other (See Comments)        Medication List     TAKE these medications    bisacodyl 5 MG EC tablet Commonly known as: DULCOLAX Take 2 tablets (10 mg total) by mouth daily as needed for moderate constipation.   docusate sodium 100 MG capsule Commonly known as: COLACE Take 1 capsule (100 mg total) by mouth 2 (two) times daily.   Eliquis 5 MG Tabs tablet Generic  drug: apixaban Take 5 mg by mouth daily.   feeding supplement Liqd Take 237 mLs by mouth 3 (three) times daily between meals.   ferrous sulfate 325 (65 FE) MG tablet Take 1 tablet (325 mg total) by mouth daily with breakfast.   liver oil-zinc oxide 40 % ointment Commonly known as: DESITIN Apply topically as needed for irritation.   losartan 25 MG tablet Commonly known as: COZAAR Take 1 tablet  (25 mg total) by mouth daily. Skip dose if SBP <120 mmHg   metoprolol tartrate 25 MG tablet Commonly known as: LOPRESSOR Take 0.5 tablets (12.5 mg total) by mouth 2 (two) times daily. Skip dose if SBP <110 mmHg   multivitamin with minerals Tabs tablet Take 1 tablet by mouth daily.   nystatin cream Commonly known as: MYCOSTATIN Apply topically 2 (two) times daily.   omeprazole 20 MG capsule Commonly known as: PRILOSEC Take 20 mg by mouth daily.   polyethylene glycol 17 g packet Commonly known as: MIRALAX / GLYCOLAX Take 17 g by mouth daily as needed for mild constipation or moderate constipation.   potassium chloride 8 MEQ tablet Commonly known as: KLOR-CON Take 8 mEq by mouth daily.   QUEtiapine 25 MG tablet Commonly known as: SEROQUEL Take 25 mg by mouth daily. Takes at 5 pm   Vitamin D 125 MCG (5000 UT) Caps Take 1 capsule by mouth daily.               Discharge Care Instructions  (From admission, onward)           Start     Ordered   10/25/20 0000  No dressing needed        10/25/20 1027            Follow-up Information     Danella Penton, MD. Schedule an appointment as soon as possible for a visit in 1 week(s).   Specialty: Internal Medicine Contact information: [phone] [facility] [address] [facility] [place] [address] [phone]                Allergies  Allergen Reactions   Lisinopril Other (See Comments)    Consultations:   Procedures/Studies: DG Elbow Complete Right  Result Date: 10/24/2020 CLINICAL DATA:  Tripped and fell, pain EXAM: RIGHT ELBOW - COMPLETE 3+ VIEW COMPARISON:  11/01/2019 FINDINGS: Frontal, bilateral oblique, and lateral views of the right elbow are obtained. No fracture, subluxation, or dislocation. Mild osteoarthritis unchanged. No joint effusion. Soft tissues are unremarkable. IMPRESSION: 1. Mild osteoarthritis.  No acute fracture. Electronically Signed   By: Sharlet Salina M.D.    On: 10/24/2020 19:50   DG Wrist Complete Right  Result Date: 10/24/2020 CLINICAL DATA:  Tripped and fell, pain EXAM: RIGHT WRIST - COMPLETE 3+ VIEW COMPARISON:  06/24/2020 FINDINGS: Frontal, oblique, lateral, and ulnar deviated views of the right wrist are obtained. A prior distal right radial fracture is identified, with evidence of significant interval healing since the prior exam and residual impaction and slight dorsal angulation. Along the medial aspect of the distal right radius there is still faint lucency identified which could reflect residual of the previous fracture line or recurrent injury. No other acute displaced fractures are identified. Stable osteoarthritis within the radial aspect of the carpus. IMPRESSION: 1. Previous distal right radial fracture, with evidence of interval healing with chronic impaction and slight dorsal angulation. Faint lucency along the medial aspect of the distal radius could reflect a residual of prior fracture line  or recurrent injury. 2. Stable osteoarthritis. Electronically Signed   By: Sharlet Salina M.D.   On: 10/24/2020 19:55   DG Ankle Complete Left  Result Date: 10/24/2020 CLINICAL DATA:  Tripped and fell, pain EXAM: LEFT ANKLE COMPLETE - 3+ VIEW COMPARISON:  None. FINDINGS: Frontal, oblique, and lateral views of the left ankle are obtained. No fracture, subluxation, or dislocation. Joint spaces are well preserved. Soft tissues are normal. IMPRESSION: 1. Unremarkable left ankle. Electronically Signed   By: Sharlet Salina M.D.   On: 10/24/2020 19:48   DG Ankle Complete Right  Result Date: 10/24/2020 CLINICAL DATA:  Tripped and fell, pain EXAM: RIGHT ANKLE - COMPLETE 3+ VIEW COMPARISON:  None. FINDINGS: Frontal, oblique, and lateral views of the right ankle are obtained. No fracture, subluxation, or dislocation. Joint spaces are well preserved. Soft tissues are normal. IMPRESSION: 1. Unremarkable right ankle. Electronically Signed   By: Sharlet Salina M.D.    On: 10/24/2020 19:49   CT Head Wo Contrast  Result Date: 10/24/2020 CLINICAL DATA:  Head trauma. EXAM: CT HEAD WITHOUT CONTRAST TECHNIQUE: Contiguous axial images were obtained from the base of the skull through the vertex without intravenous contrast. COMPARISON:  June 25, 2020 FINDINGS: Brain: No evidence of acute infarction, hemorrhage, hydrocephalus, extra-axial collection or mass lesion/mass effect. Advanced deep white matter microvascular ischemia. Moderate brain parenchymal volume loss. Vascular: No hyperdense vessel or unexpected calcification. Skull: Normal. Negative for fracture or focal lesion. IMPRESSION: 1. No acute intracranial abnormality. 2. Advanced deep white matter microvascular ischemia. 3. Moderate brain parenchymal volume loss. Electronically Signed   By: Ted Mcalpine M.D.   On: 10/24/2020 19:56   CT Cervical Spine Wo Contrast  Result Date: 10/24/2020 CLINICAL DATA:  Neck trauma. EXAM: CT CERVICAL SPINE WITHOUT CONTRAST TECHNIQUE: Multidetector CT imaging of the cervical spine was performed without intravenous contrast. Multiplanar CT image reconstructions were also generated. COMPARISON:  June 25, 2020 FINDINGS: Alignment: 4 mm anterolisthesis of C4 on C5. 2 mm anterolisthesis of C7 on T1. Skull base and vertebrae: No acute fracture. No primary bone lesion or focal pathologic process. Soft tissues and spinal canal: No prevertebral fluid or swelling. No visible canal hematoma. Disc levels:  Osteoarthritic changes, multilevel. Upper chest: Negative. Other: None. IMPRESSION: 1. No evidence of acute traumatic injury to the cervical spine. 2. 4 mm anterolisthesis of C4 on C5 and 2 mm anterolisthesis of C7 on T1, likely degenerative in stable. 3. Osteoarthritic changes of the cervical spine. Electronically Signed   By: Ted Mcalpine M.D.   On: 10/24/2020 20:06   US RENAL  Result Date: 09/29/2020 CLINICAL DATA:  Decreased urine output EXAM: RENAL / URINARY TRACT  ULTRASOUND COMPLETE COMPARISON:  None. FINDINGS: Right Kidney: Renal measurements: 8.6 x 3.4 x 4.7 cm = volume: 73 mL. Increased renal parenchymal echogenicity. Inferior pole 2.1 cm right renal cyst with single thin internal septation. No shadowing stone or hydronephrosis visualized. Left Kidney: Renal measurements: 9.4 x 5.1 x 4.7 cm = volume: 120 mL. Increased renal parenchymal echogenicity. No mass, shadowing stone, or hydronephrosis visualized. Bladder: Decompressed by Foley catheter. Other: None. IMPRESSION: 1. No evidence of obstructive uropathy. 2. Increased renal parenchymal echogenicity bilaterally, suggesting medical renal disease. 3. Incidental note of a minimally complex 2.1 cm right renal cyst with single thin internal septation. Electronically Signed   By: Duanne Guess D.O.   On: 09/29/2020 12:21   DG Pelvis Portable  Result Date: 10/24/2020 CLINICAL DATA:  Tripped and fell, pain EXAM: PORTABLE PELVIS 1-2  VIEWS COMPARISON:  07/31/2020 FINDINGS: Single frontal view of the pelvis was performed. Partial visualization of a right hip arthroplasty which appears well aligned. Lateral plate and cerclage wire fixation of the proximal right femur is partially visualized, consistent with prior fracture repair. Mild left hip osteoarthritis. No acute displaced fractures. Sacroiliac joints are normal. IMPRESSION: 1. Postoperative changes of the right hip and femur. 2. Mild left hip osteoarthritis. 3. No acute fracture. Electronically Signed   By: Sharlet SalinaMichael  Brown M.D.   On: 10/24/2020 19:52   DG Chest Portable 1 View  Result Date: 10/24/2020 CLINICAL DATA:  Tripped and fell EXAM: PORTABLE CHEST 1 VIEW COMPARISON:  07/31/2020 FINDINGS: Single frontal view of the chest demonstrates stable single lead pacer. The cardiac silhouette is unchanged. Stable thoracic aortic atherosclerosis. No acute airspace disease, effusion, or pneumothorax. Chronic background scarring unchanged. No acute displaced fractures.  IMPRESSION: 1. Stable chest, no acute process. Electronically Signed   By: Sharlet SalinaMichael  Brown M.D.   On: 10/24/2020 19:48   DG Knee Complete 4 Views Left  Result Date: 10/24/2020 CLINICAL DATA:  Tripped and fell, pain EXAM: LEFT KNEE - COMPLETE 4+ VIEW COMPARISON:  None. FINDINGS: Frontal, bilateral oblique, and lateral views of the left knee are obtained. No fracture, subluxation, or dislocation. Mild medial compartmental joint space narrowing. No joint effusion. Diffuse atherosclerosis. IMPRESSION: 1. Mild medial compartmental osteoarthritis. No acute bony abnormality. Electronically Signed   By: Sharlet SalinaMichael  Brown M.D.   On: 10/24/2020 19:50   DG Knee Complete 4 Views Right  Result Date: 10/24/2020 CLINICAL DATA:  Tripped and fell, pain EXAM: RIGHT KNEE - COMPLETE 4+ VIEW COMPARISON:  08/03/2020 FINDINGS: Frontal, bilateral oblique, lateral views of the right knee are obtained. No fracture, subluxation, or dislocation. Mild medial compartmental osteoarthritis. Distal margin of a plate and screw fixation traversing a prior femoral fracture is noted. Soft tissues are unremarkable. No joint effusion. IMPRESSION: 1. Mild osteoarthritis.  No acute bony abnormality. Electronically Signed   By: Sharlet SalinaMichael  Brown M.D.   On: 10/24/2020 19:57   CT Maxillofacial Wo Contrast  Result Date: 10/24/2020 CLINICAL DATA:  Status post fall. EXAM: CT MAXILLOFACIAL WITHOUT CONTRAST TECHNIQUE: Multidetector CT imaging of the maxillofacial structures was performed. Multiplanar CT image reconstructions were also generated. COMPARISON:  None. FINDINGS: Osseous: Minimally displaced fracture of the anterior wall of the right maxillary sinus, with hairline extension to the medial and lateral sinus wall. Orbits: Negative. No traumatic or inflammatory finding. Sinuses: Hemorrhagic effusion in the right maxillary sinus, and partially in the right ethmoid air cells. Soft tissues: Right-sided facial swelling. IMPRESSION: 1. Minimally displaced  fracture of the anterior wall of the right maxillary sinus, with hairline extension to the medial and lateral sinus wall. 2. Hemorrhagic effusion in the right maxillary sinus, extending in the right ethmoid air cells. 3. Right-sided facial swelling. Electronically Signed   By: Ted Mcalpineobrinka  Dimitrova M.D.   On: 10/24/2020 20:01    Subjective: Patient was seen and examined in ED today.  She was eating her breakfast.  Denies any complaints.  Daughter at bedside.  Discharge Exam: Vitals:   10/24/20 2230 10/25/20 0615  BP: (!) 166/82 (!) 177/87  Pulse: (!) 116 88  Resp: 18 14  Temp: 98.4 F (36.9 C)   SpO2: 91% 98%   Vitals:   10/24/20 1848 10/24/20 1849 10/24/20 2230 10/25/20 0615  BP: (!) 192/94  (!) 166/82 (!) 177/87  Pulse: 92  (!) 116 88  Resp: 20  18 14   Temp: 99.1 F (  37.3 C)  98.4 F (36.9 C)   TempSrc: Oral  Oral   SpO2: 100%  91% 98%  Weight:  49.9 kg    Height:  5\' 8"  (1.727 m)      General: Pt is alert, awake, not in acute distress, extensive bruising involving right side of face, right upper and lower extremities. Cardiovascular: RRR, S1/S2 +, no rubs, no gallops Respiratory: CTA bilaterally, no wheezing, no rhonchi Abdominal: Soft, NT, ND, bowel sounds + Extremities: no edema, no cyanosis   The results of significant diagnostics from this hospitalization (including imaging, microbiology, ancillary and laboratory) are listed below for reference.    Microbiology: Recent Results (from the past 240 hour(s))  Resp Panel by RT-PCR (Flu A&B, Covid) Nasopharyngeal Swab     Status: None   Collection Time: 10/24/20 10:31 PM   Specimen: Nasopharyngeal Swab; Nasopharyngeal(NP) swabs in vial transport medium  Result Value Ref Range Status   SARS Coronavirus 2 by RT PCR NEGATIVE NEGATIVE Final    Comment: (NOTE) SARS-CoV-2 target nucleic acids are NOT DETECTED.  The SARS-CoV-2 RNA is generally detectable in upper respiratory specimens during the acute phase of infection. The  lowest concentration of SARS-CoV-2 viral copies this assay can detect is 138 copies/mL. A negative result does not preclude SARS-Cov-2 infection and should not be used as the sole basis for treatment or other patient management decisions. A negative result may occur with  improper specimen collection/handling, submission of specimen other than nasopharyngeal swab, presence of viral mutation(s) within the areas targeted by this assay, and inadequate number of viral copies(<138 copies/mL). A negative result must be combined with clinical observations, patient history, and epidemiological information. The expected result is Negative.  Fact Sheet for Patients:  10/26/20  Fact Sheet for Healthcare Providers:  BloggerCourse.com  This test is no t yet approved or cleared by the [place] FDA and  has been authorized for detection and/or diagnosis of SARS-CoV-2 by FDA under an Emergency Use Authorization (EUA). This EUA will remain  in effect (meaning this test can be used) for the duration of the COVID-19 declaration under Section 564(b)(1) of the Act, 21 U.S.C.section 360bbb-3(b)(1), unless the authorization is terminated  or revoked sooner.       Influenza A by PCR NEGATIVE NEGATIVE Final   Influenza B by PCR NEGATIVE NEGATIVE Final    Comment: (NOTE) The Xpert Xpress SARS-CoV-2/FLU/RSV plus assay is intended as an aid in the diagnosis of influenza from Nasopharyngeal swab specimens and should not be used as a sole basis for treatment. Nasal washings and aspirates are unacceptable for Xpert Xpress SARS-CoV-2/FLU/RSV testing.  Fact Sheet for Patients: [place]  Fact Sheet for Healthcare Providers: BloggerCourse.com  This test is not yet approved or cleared by the [place] FDA and has been authorized for detection and/or diagnosis of SARS-CoV-2 by FDA under  an Emergency Use Authorization (EUA). This EUA will remain in effect (meaning this test can be used) for the duration of the COVID-19 declaration under Section 564(b)(1) of the Act, 21 U.S.C. section 360bbb-3(b)(1), unless the authorization is terminated or revoked.  Performed at [facility], [address] [place], [place]      Labs: BNP (last 3 results) No results for input(s): BNP in the last 8760 hours. Basic Metabolic Panel: Recent Labs  Lab 10/24/20 2015  NA 136  K 3.9  CL 103  CO2 28  GLUCOSE 117*  BUN 27*  CREATININE 0.88  CALCIUM 8.9   Liver Function  Tests: Recent Labs  Lab 10/24/20 2015  AST 36  ALT 27  ALKPHOS 108  BILITOT 1.0  PROT 7.1  ALBUMIN 3.8   No results for input(s): LIPASE, AMYLASE in the last 168 hours. No results for input(s): AMMONIA in the last 168 hours. CBC: Recent Labs  Lab 10/24/20 2015  WBC 9.2  NEUTROABS 7.2  HGB 11.8*  HCT 34.9*  MCV 87.0  PLT 179   Cardiac Enzymes: No results for input(s): CKTOTAL, CKMB, CKMBINDEX, TROPONINI in the last 168 hours. BNP: Invalid input(s): POCBNP CBG: No results for input(s): GLUCAP in the last 168 hours. D-Dimer No results for input(s): DDIMER in the last 72 hours. Hgb A1c No results for input(s): HGBA1C in the last 72 hours. Lipid Profile No results for input(s): CHOL, HDL, LDLCALC, TRIG, CHOLHDL, LDLDIRECT in the last 72 hours. Thyroid function studies No results for input(s): TSH, T4TOTAL, T3FREE, THYROIDAB in the last 72 hours.  Invalid input(s): FREET3 Anemia work up No results for input(s): VITAMINB12, FOLATE, FERRITIN, TIBC, IRON, RETICCTPCT in the last 72 hours. Urinalysis    Component Value Date/Time   COLORURINE YELLOW (A) 09/20/2020 2115   APPEARANCEUR TURBID (A) 09/20/2020 2115   APPEARANCEUR Hazy 08/05/2011 1551   LABSPEC 1.015 09/20/2020 2115   LABSPEC 1.014 08/05/2011 1551   PHURINE 7.0 09/20/2020 2115   GLUCOSEU NEGATIVE 09/20/2020 2115    GLUCOSEU Negative 08/05/2011 1551   HGBUR LARGE (A) 09/20/2020 2115   BILIRUBINUR NEGATIVE 09/20/2020 2115   BILIRUBINUR Negative 08/05/2011 1551   KETONESUR NEGATIVE 09/20/2020 2115   PROTEINUR 100 (A) 09/20/2020 2115   NITRITE NEGATIVE 09/20/2020 2115   LEUKOCYTESUR MODERATE (A) 09/20/2020 2115   LEUKOCYTESUR 3+ 08/05/2011 1551   Sepsis Labs Invalid input(s): PROCALCITONIN,  WBC,  LACTICIDVEN Microbiology Recent Results (from the past 240 hour(s))  Resp Panel by RT-PCR (Flu A&B, Covid) Nasopharyngeal Swab     Status: None   Collection Time: 10/24/20 10:31 PM   Specimen: Nasopharyngeal Swab; Nasopharyngeal(NP) swabs in vial transport medium  Result Value Ref Range Status   SARS Coronavirus 2 by RT PCR NEGATIVE NEGATIVE Final    Comment: (NOTE) SARS-CoV-2 target nucleic acids are NOT DETECTED.  The SARS-CoV-2 RNA is generally detectable in upper respiratory specimens during the acute phase of infection. The lowest concentration of SARS-CoV-2 viral copies this assay can detect is 138 copies/mL. A negative result does not preclude SARS-Cov-2 infection and should not be used as the sole basis for treatment or other patient management decisions. A negative result may occur with  improper specimen collection/handling, submission of specimen other than nasopharyngeal swab, presence of viral mutation(s) within the areas targeted by this assay, and inadequate number of viral copies(<138 copies/mL). A negative result must be combined with clinical observations, patient history, and epidemiological information. The expected result is Negative.  Fact Sheet for Patients:  BloggerCourse.com  Fact Sheet for Healthcare Providers:  [employer]  This test is no t yet approved or cleared by the [place] FDA and  has been authorized for detection and/or diagnosis of SARS-CoV-2 by FDA under an Emergency Use Authorization (EUA). This  EUA will remain  in effect (meaning this test can be used) for the duration of the COVID-19 declaration under Section 564(b)(1) of the Act, 21 U.S.C.section 360bbb-3(b)(1), unless the authorization is terminated  or revoked sooner.       Influenza A by PCR NEGATIVE NEGATIVE Final   Influenza B by PCR NEGATIVE NEGATIVE Final    Comment: (NOTE) The Xpert  Xpress SARS-CoV-2/FLU/RSV plus assay is intended as an aid in the diagnosis of influenza from Nasopharyngeal swab specimens and should not be used as a sole basis for treatment. Nasal washings and aspirates are unacceptable for Xpert Xpress SARS-CoV-2/FLU/RSV testing.  Fact Sheet for Patients: BloggerCourse.com  Fact Sheet for Healthcare Providers: [employer]iousBroker.it  This test is not yet approved or cleared by the [place] FDA and has been authorized for detection and/or diagnosis of SARS-CoV-2 by FDA under an Emergency Use Authorization (EUA). This EUA will remain in effect (meaning this test can be used) for the duration of the COVID-19 declaration under Section 564(b)(1) of the Act, 21 U.S.C. section 360bbb-3(b)(1), unless the authorization is terminated or revoked.  Performed at [facility], [address] [place], [place] [address]     Time coordinating discharge: Over 30 minutes  SIGNED:  Arnetha Courser, MD  [facility] 10/25/2020, 10:29 AM  If 7PM-7AM, please contact night-coverage [email or web address]  This record has been created using Conservation officer, historic buildings. Errors have been sought and corrected,but may not always be located. Such creation errors do not reflect on the standard of care.

## 2020-10-28 ENCOUNTER — Other Ambulatory Visit: Payer: Self-pay | Admitting: Internal Medicine

## 2020-10-28 NOTE — Telephone Encounter (Signed)
Clearance received from Dr. Philemon Kingdom office, ok for patient to stop Eliquis 48 prior to procedure. Per last office note with Sam patient removed foley on her own and had a low PVR. Patient returning to the office for a 2wk follow up with PVR for re-evaluation. SPT may not need to be placed

## 2020-11-03 ENCOUNTER — Other Ambulatory Visit: Payer: Self-pay

## 2020-11-03 ENCOUNTER — Encounter: Payer: Self-pay | Admitting: Physician Assistant

## 2020-11-03 ENCOUNTER — Ambulatory Visit (INDEPENDENT_AMBULATORY_CARE_PROVIDER_SITE_OTHER): Payer: Medicare Other | Admitting: Physician Assistant

## 2020-11-03 VITALS — BP 146/73 | HR 116 | Ht 68.0 in | Wt 103.0 lb

## 2020-11-03 DIAGNOSIS — R339 Retention of urine, unspecified: Secondary | ICD-10-CM | POA: Diagnosis not present

## 2020-11-03 LAB — BLADDER SCAN AMB NON-IMAGING

## 2020-11-03 NOTE — Progress Notes (Signed)
Patient returned to clinic today with her daughter for repeat PVR.  Since she was last seen in clinic 2 weeks ago, she fell at home and was seen in the emergency department.  She sustained a laceration over the right eyelid and a maxillary sinus fracture, which is being managed conservatively.  Today she reports she continues to urinate without difficulty or pain.  No acute concerns today.  PVR 12 mL.  At this point, I recommend follow-up as needed as there is no evidence of recurrent urinary retention.  We discussed that she should return to clinic with new difficulty urinating, gross hematuria, or dysuria.  She expressed understanding.  Results for orders placed or performed in visit on 11/03/20  Bladder Scan (Post Void Residual) in office  Result Value Ref Range   Scan Result 45mL     Carman Ching, PA-C  11/03/20 2:48 PM  I spent 12 minutes on the day of the encounter to include pre-visit record review, face-to-face time with the patient, and post-visit ordering of tests.

## 2021-03-30 ENCOUNTER — Encounter
Admission: RE | Disposition: A | Discharge status: Home / Self Care | Payer: Self-pay | Source: Home / Self Care | Attending: Cardiology

## 2021-03-30 ENCOUNTER — Encounter: Payer: Self-pay | Admitting: Cardiology

## 2021-03-30 ENCOUNTER — Ambulatory Visit
Admission: RE | Admit: 2021-03-30 | Discharge: 2021-03-30 | Disposition: A | Discharge status: Home / Self Care | Payer: Medicare Other | Attending: Cardiology | Admitting: Cardiology

## 2021-03-30 DIAGNOSIS — Z45010 Encounter for checking and testing of cardiac pacemaker pulse generator [battery]: Secondary | ICD-10-CM | POA: Diagnosis present

## 2021-03-30 DIAGNOSIS — Z01818 Encounter for other preprocedural examination: Secondary | ICD-10-CM

## 2021-03-30 DIAGNOSIS — Z45018 Encounter for adjustment and management of other part of cardiac pacemaker: Secondary | ICD-10-CM

## 2021-03-30 HISTORY — PX: PPM GENERATOR CHANGEOUT: EP1233

## 2021-03-30 SURGERY — PPM GENERATOR CHANGEOUT
Anesthesia: Moderate Sedation

## 2021-03-30 MED ORDER — CHLORHEXIDINE GLUCONATE CLOTH 2 % EX PADS
6.0000 | MEDICATED_PAD | Freq: Once | CUTANEOUS | Status: AC
  Administered 2021-03-30: 6 via TOPICAL

## 2021-03-30 MED ORDER — SODIUM CHLORIDE 0.9 % IV SOLN
INTRAVENOUS | Status: DC
  Administered 2021-03-30: 1000 mL via INTRAVENOUS

## 2021-03-30 MED ORDER — LIDOCAINE HCL (PF) 1 % IJ SOLN
INTRAMUSCULAR | Status: DC | PRN
  Administered 2021-03-30: 10 mL

## 2021-03-30 MED ORDER — FENTANYL CITRATE (PF) 100 MCG/2ML IJ SOLN
INTRAMUSCULAR | Status: DC | PRN
  Administered 2021-03-30 (×2): 25 ug via INTRAVENOUS

## 2021-03-30 MED ORDER — ONDANSETRON HCL 4 MG/2ML IJ SOLN: 4.0000 mg | Freq: Four times a day (QID) | INTRAMUSCULAR | Status: DC | PRN

## 2021-03-30 MED ORDER — MIDAZOLAM HCL 2 MG/2ML IJ SOLN
INTRAMUSCULAR | Status: DC | PRN
  Administered 2021-03-30: .5 mg via INTRAVENOUS

## 2021-03-30 MED ORDER — SODIUM CHLORIDE 0.9 % IV SOLN
80.0000 mg | INTRAVENOUS | Status: DC
  Filled 2021-03-30: qty 2

## 2021-03-30 MED ORDER — LIDOCAINE HCL 1 % IJ SOLN
INTRAMUSCULAR | Status: AC
  Filled 2021-03-30: qty 20

## 2021-03-30 MED ORDER — MIDAZOLAM HCL 2 MG/2ML IJ SOLN
INTRAMUSCULAR | Status: AC
  Filled 2021-03-30: qty 2

## 2021-03-30 MED ORDER — ACETAMINOPHEN 325 MG PO TABS: 325.0000 mg | ORAL_TABLET | ORAL | Status: DC | PRN

## 2021-03-30 MED ORDER — FENTANYL CITRATE (PF) 100 MCG/2ML IJ SOLN
INTRAMUSCULAR | Status: AC
  Filled 2021-03-30: qty 2

## 2021-03-30 MED ORDER — CEFAZOLIN SODIUM-DEXTROSE 2-4 GM/100ML-% IV SOLN
INTRAVENOUS | Status: AC
  Administered 2021-03-30: 2 g via INTRAVENOUS
  Filled 2021-03-30: qty 100

## 2021-03-30 MED ORDER — CEFAZOLIN SODIUM-DEXTROSE 2-4 GM/100ML-% IV SOLN: 2.0000 g | INTRAVENOUS | Status: AC

## 2021-03-30 SURGICAL SUPPLY — 15 items
CABLE SURG 12 DISP A/V CHANNEL (MISCELLANEOUS) ×2 IMPLANT
COVER SURGICAL LIGHT HANDLE (MISCELLANEOUS) ×2 IMPLANT
DEVICE DSSCT PLSMBLD 3.0S LGHT (MISCELLANEOUS) ×1 IMPLANT
DRAPE INCISE 23X17 IOBAN STRL (DRAPES) ×1
DRAPE INCISE IOBAN 23X17 STRL (DRAPES) ×1 IMPLANT
IPG PACE AZUR XT SR MRI W1SR01 (Pacemaker) ×1 IMPLANT
KIT SYRINGE INJ CVI SPIKEX1 (MISCELLANEOUS) ×2 IMPLANT
PACE AZURE XT SR MRI W1SR01 (Pacemaker) ×2 IMPLANT
PAD ELECT DEFIB RADIOL ZOLL (MISCELLANEOUS) ×2 IMPLANT
PLASMABLADE 3.0S W/LIGHT (MISCELLANEOUS) ×2
SUT VIC AB 2-0 CT1 (SUTURE) ×2 IMPLANT
SUT VICRYL 4-0  27 PS-2 BARIAT (SUTURE) ×1
SUT VICRYL 4-0 27 PS-2 BARIAT (SUTURE) ×1
SUTURE VICRYL 4-0 27 PS-2 BART (SUTURE) ×1 IMPLANT
TRAY PACEMAKER INSERTION (PACKS) ×2 IMPLANT

## 2021-03-30 NOTE — Progress Notes (Signed)
Dr. Darrold Junker at bedside, speaking with pt. And her daughter re: pacemaker battery change out. Daughter verbalizes understanding of conversation.

## 2021-03-30 NOTE — Discharge Instructions (Signed)
Patient may remove outer bandage on 04/01/2021.  Leave Steri-Strips on.  Patient may shower on 04/01/2021.

## 2021-07-24 ENCOUNTER — Emergency Department: Payer: Medicare Other

## 2021-07-24 ENCOUNTER — Inpatient Hospital Stay
Admission: EM | Admit: 2021-07-24 | Discharge: 2021-07-29 | DRG: 481 | Disposition: A | Discharge status: Home Health | Payer: Medicare Other | Attending: Internal Medicine | Admitting: Internal Medicine

## 2021-07-24 ENCOUNTER — Other Ambulatory Visit: Payer: Self-pay

## 2021-07-24 DIAGNOSIS — S72145A Nondisplaced intertrochanteric fracture of left femur, initial encounter for closed fracture: Principal | ICD-10-CM

## 2021-07-24 DIAGNOSIS — R296 Repeated falls: Secondary | ICD-10-CM | POA: Diagnosis present

## 2021-07-24 DIAGNOSIS — I739 Peripheral vascular disease, unspecified: Secondary | ICD-10-CM | POA: Diagnosis present

## 2021-07-24 DIAGNOSIS — I4891 Unspecified atrial fibrillation: Secondary | ICD-10-CM | POA: Diagnosis present

## 2021-07-24 DIAGNOSIS — Z7982 Long term (current) use of aspirin: Secondary | ICD-10-CM

## 2021-07-24 DIAGNOSIS — Z90710 Acquired absence of both cervix and uterus: Secondary | ICD-10-CM

## 2021-07-24 DIAGNOSIS — F03911 Unspecified dementia, unspecified severity, with agitation: Secondary | ICD-10-CM | POA: Diagnosis present

## 2021-07-24 DIAGNOSIS — Y92008 Other place in unspecified non-institutional (private) residence as the place of occurrence of the external cause: Secondary | ICD-10-CM

## 2021-07-24 DIAGNOSIS — Z66 Do not resuscitate: Secondary | ICD-10-CM | POA: Diagnosis present

## 2021-07-24 DIAGNOSIS — I482 Chronic atrial fibrillation, unspecified: Secondary | ICD-10-CM | POA: Diagnosis present

## 2021-07-24 DIAGNOSIS — S72142A Displaced intertrochanteric fracture of left femur, initial encounter for closed fracture: Principal | ICD-10-CM | POA: Diagnosis present

## 2021-07-24 DIAGNOSIS — I110 Hypertensive heart disease with heart failure: Secondary | ICD-10-CM | POA: Diagnosis present

## 2021-07-24 DIAGNOSIS — Z20822 Contact with and (suspected) exposure to covid-19: Secondary | ICD-10-CM | POA: Diagnosis present

## 2021-07-24 DIAGNOSIS — I10 Essential (primary) hypertension: Secondary | ICD-10-CM | POA: Diagnosis present

## 2021-07-24 DIAGNOSIS — I509 Heart failure, unspecified: Secondary | ICD-10-CM | POA: Diagnosis present

## 2021-07-24 DIAGNOSIS — E785 Hyperlipidemia, unspecified: Secondary | ICD-10-CM | POA: Diagnosis present

## 2021-07-24 DIAGNOSIS — N179 Acute kidney failure, unspecified: Secondary | ICD-10-CM | POA: Diagnosis present

## 2021-07-24 DIAGNOSIS — I495 Sick sinus syndrome: Secondary | ICD-10-CM | POA: Diagnosis present

## 2021-07-24 DIAGNOSIS — M810 Age-related osteoporosis without current pathological fracture: Secondary | ICD-10-CM | POA: Diagnosis present

## 2021-07-24 DIAGNOSIS — R636 Underweight: Secondary | ICD-10-CM | POA: Diagnosis present

## 2021-07-24 DIAGNOSIS — Z79899 Other long term (current) drug therapy: Secondary | ICD-10-CM

## 2021-07-24 DIAGNOSIS — W108XXA Fall (on) (from) other stairs and steps, initial encounter: Secondary | ICD-10-CM | POA: Diagnosis present

## 2021-07-24 DIAGNOSIS — Z681 Body mass index (BMI) 19 or less, adult: Secondary | ICD-10-CM

## 2021-07-24 DIAGNOSIS — I4892 Unspecified atrial flutter: Secondary | ICD-10-CM | POA: Diagnosis present

## 2021-07-24 DIAGNOSIS — E876 Hypokalemia: Secondary | ICD-10-CM | POA: Diagnosis present

## 2021-07-24 DIAGNOSIS — M25551 Pain in right hip: Secondary | ICD-10-CM | POA: Diagnosis present

## 2021-07-24 DIAGNOSIS — Z96641 Presence of right artificial hip joint: Secondary | ICD-10-CM | POA: Diagnosis present

## 2021-07-24 DIAGNOSIS — Y9301 Activity, walking, marching and hiking: Secondary | ICD-10-CM | POA: Diagnosis present

## 2021-07-24 DIAGNOSIS — S72002A Fracture of unspecified part of neck of left femur, initial encounter for closed fracture: Secondary | ICD-10-CM | POA: Diagnosis present

## 2021-07-24 DIAGNOSIS — M25552 Pain in left hip: Secondary | ICD-10-CM | POA: Diagnosis not present

## 2021-07-24 LAB — CBC WITH DIFFERENTIAL/PLATELET
Abs Immature Granulocytes: 0.02 10*3/uL (ref 0.00–0.07)
Basophils Absolute: 0.1 10*3/uL (ref 0.0–0.1)
Basophils Relative: 1 %
Eosinophils Absolute: 0.1 10*3/uL (ref 0.0–0.5)
Eosinophils Relative: 2 %
HCT: 38.3 % (ref 36.0–46.0)
Hemoglobin: 12.2 g/dL (ref 12.0–15.0)
Immature Granulocytes: 0 %
Lymphocytes Relative: 14 %
Lymphs Abs: 0.9 10*3/uL (ref 0.7–4.0)
MCH: 28.3 pg (ref 26.0–34.0)
MCHC: 31.9 g/dL (ref 30.0–36.0)
MCV: 88.9 fL (ref 80.0–100.0)
Monocytes Absolute: 0.7 10*3/uL (ref 0.1–1.0)
Monocytes Relative: 11 %
Neutro Abs: 4.6 10*3/uL (ref 1.7–7.7)
Neutrophils Relative %: 72 %
Platelets: 154 10*3/uL (ref 150–400)
RBC: 4.31 MIL/uL (ref 3.87–5.11)
RDW: 13.9 % (ref 11.5–15.5)
WBC: 6.4 10*3/uL (ref 4.0–10.5)
nRBC: 0 % (ref 0.0–0.2)

## 2021-07-24 LAB — BASIC METABOLIC PANEL
Anion gap: 8 (ref 5–15)
BUN: 37 mg/dL — ABNORMAL HIGH (ref 8–23)
CO2: 28 mmol/L (ref 22–32)
Calcium: 9 mg/dL (ref 8.9–10.3)
Chloride: 100 mmol/L (ref 98–111)
Creatinine, Ser: 1.08 mg/dL — ABNORMAL HIGH (ref 0.44–1.00)
GFR, Estimated: 48 mL/min — ABNORMAL LOW (ref >60)
Glucose, Bld: 181 mg/dL — ABNORMAL HIGH (ref 70–99)
Potassium: 3.6 mmol/L (ref 3.5–5.1)
Sodium: 136 mmol/L (ref 135–145)

## 2021-07-24 MED ORDER — LORAZEPAM 2 MG/ML IJ SOLN
1.0000 mg | Freq: Once | INTRAMUSCULAR | Status: AC
  Administered 2021-07-24: 1 mg via INTRAVENOUS
  Filled 2021-07-24: qty 1

## 2021-07-24 MED ORDER — FENTANYL CITRATE PF 50 MCG/ML IJ SOSY
50.0000 ug | PREFILLED_SYRINGE | Freq: Once | INTRAMUSCULAR | Status: AC
  Administered 2021-07-24: 50 ug via INTRAVENOUS
  Filled 2021-07-24: qty 1

## 2021-07-24 NOTE — ED Triage Notes (Signed)
Pt BIB EMS from home for a mechanical fall. Pt fell down 2 stairs in the carport. Pt c/o L hip pain. 2 skin tears to L FA, covered at this time by gauze. Pt received fentanyl 50 mcg IVP by EMS pta. ?

## 2021-07-24 NOTE — ED Notes (Signed)
NT Melody was in the room when writer came to the room. Melody stated pt attempted to take IV out of arm (when IV was wrapped). Pt became agitated when trying to put a new pulse ox on her finger. Pulse ox is now on her toe covered with a sock. Writer wrapped IV with coban and given an activity belt. ?

## 2021-07-24 NOTE — ED Provider Notes (Signed)
? ?[facility] ?Provider Note ? ? ? Event Date/Time  ? First MD Initiated Contact with Patient 07/24/21 1939   ?  (approximate) ? ? ?History  ? ?Fall ? ? ?HPI ? ?Kristen Osborn is a 88 y.o. female with history of hypertension, hyperlipidemia, atrial fibrillation on Eliquis, and likely dementia who presents with left hip pain after a mechanical fall from standing height that was witnessed by family members.  The patient did not hit her head.  She denies any neck or back pain.  She reports pain in the left hip and leg. ? ? ? ? ?Physical Exam  ? ?Triage Vital Signs: ?ED Triage Vitals  ?Enc Vitals Group  ?   BP 07/24/21 1932 (!) 149/97  ?   Pulse Rate 07/24/21 1932 (!) 113  ?   Resp 07/24/21 1932 19  ?   Temp 07/24/21 1932 97.7 ?F (36.5 ?C)  ?   Temp Source 07/24/21 1932 Oral  ?   SpO2 07/24/21 1929 94 %  ?   Weight 07/24/21 1934 100 lb 1.4 oz (45.4 kg)  ?   Height 07/24/21 1934 5\' 3"  (1.6 m)  ?   Head Circumference --   ?   Peak Flow --   ?   Pain Score --   ?   Pain Loc --   ?   Pain Edu? --   ?   Excl. in [place]? --   ? ? ?Most recent vital signs: ?Vitals:  ? 07/24/21 2100 07/24/21 2115  ?BP: 137/87   ?Pulse: 83 81  ?Resp: 17   ?Temp:    ?SpO2: 97%   ? ? ?General: Awake, uncomfortable appearing but in no distress. ?CV:  Good peripheral perfusion.  ?Resp:  Normal effort.  ?Abd:  No distention.  ?Other:  Pain on range of motion of left hip.  No deformity.  2+ DP pulse.  Good color to the left leg.  No midline spinal tenderness.  Motor intact in all extremities.  Normal coordination. ? ? ?ED Results / Procedures / Treatments  ? ?Labs ?(all labs ordered are listed, but only abnormal results are displayed) ?Labs Reviewed  ?CBC WITH DIFFERENTIAL/PLATELET  ?BASIC METABOLIC PANEL  ? ? ? ?EKG ? ? ? ? ?RADIOLOGY ? ?XR L hip: I independently viewed and interpreted the images; there is a left intertrochanteric hip fracture. ? ?PROCEDURES: ? ?Critical Care performed: No ? ?Procedures ? ? ?MEDICATIONS ORDERED IN  ED: ?Medications  ?fentaNYL (SUBLIMAZE) injection 50 mcg (50 mcg Intravenous Given 07/24/21 2040)  ?LORazepam (ATIVAN) injection 1 mg (1 mg Intravenous Given 07/24/21 2314)  ? ? ? ?IMPRESSION / MDM / ASSESSMENT AND PLAN / ED COURSE  ?I reviewed the triage vital signs and the nursing notes. ? ?88 year old female with PMH as noted above presents after witnessed mechanical fall from standing height with left hip pain.  Family members states she did not hit her head, and she denies any pain elsewhere.  There is no deformity to the left hip, and the left lower extremity is neuro/vascular intact, however ? ?Overall presentation is concerning for left hip contusion versus fracture.  We will obtain x-rays and reassess.  There is no evidence of other injuries. ? ?----------------------------------------- ?11:22 PM on 07/24/2021 ?----------------------------------------- ? ?X-ray shows a left intertrochanteric fracture.  I consulted Dr. Roland Rack from orthopedics.  I also consulted Dr. Posey Pronto from the hospitalist service for admission; based on our discussion she agrees to admit the patient. ? ? ?FINAL  CLINICAL IMPRESSION(S) / ED DIAGNOSES  ? ?Final diagnoses:  ?Closed nondisplaced intertrochanteric fracture of left femur, initial encounter ([place])  ? ? ? ?Rx / DC Orders  ? ?ED Discharge Orders   ? ? None  ? ?  ? ? ? ?Note:  This document was prepared using Dragon voice recognition software and may include unintentional dictation errors.  ?  Arta Silence, MD ?07/24/21 2323 ? ?

## 2021-07-24 NOTE — H&P (Signed)
?History and Physical  ? ? ?Patient: Kristen Osborn DOB: 06/20/1926 ?DOA: 07/24/2021 ?DOS: the patient was seen and examined on 07/25/2021 ?PCP: Kristen PentonMiller, Mark F, MD  ?Patient coming from: Home ? ?Chief Complaint:  ?Chief Complaint  ?Patient presents with  ? Fall  ? ?HPI: Kristen Osborn is a 88 y.o. female with medical history significant of  ? ?Pt was trying to get out of her house and while walking she lost her balance and fell on left side. Pt fell at 6:45 PM. Pt is fairly cooperative but doe snot let me check her eyes or mouth.  ?Pt does not smoke or drink. Daughter states she just same on shift and does not know if mom had ant other events.  ? ? ?Review of Systems: Review of Systems  ?Unable to perform ROS: Age  ? ?Past Medical History:  ?Diagnosis Date  ? Atrial fibrillation (HCC)   ? CHF (congestive heart failure) (HCC)   ? Dementia (HCC)   ? ?Past Surgical History:  ?Procedure Laterality Date  ? ABDOMINAL HYSTERECTOMY    ? CLOSED REDUCTION WRIST FRACTURE Right 06/28/2020  ? Procedure: CLOSED REDUCTION WRIST;  Surgeon: Lyndle HerrlichBowers, James R, MD;  Location: [facility] ORS;  Service: Orthopedics;  Laterality: Right;  ? HIP ARTHROPLASTY Right 06/28/2020  ? Procedure: ARTHROPLASTY BIPOLAR HIP (HEMIARTHROPLASTY);  Surgeon: Lyndle HerrlichBowers, James R, MD;  Location: [facility] ORS;  Service: Orthopedics;  Laterality: Right;  ? ORIF PERIPROSTHETIC FRACTURE Right 08/03/2020  ? Procedure: OPEN REDUCTION INTERNAL FIXATION (ORIF) PERIPROSTHETIC FRACTURE;  Surgeon: Lyndle HerrlichBowers, James R, MD;  Location: [facility] ORS;  Service: Orthopedics;  Laterality: Right;  ? PPM GENERATOR CHANGEOUT N/A 03/30/2021  ? Procedure: PPM GENERATOR CHANGEOUT;  Surgeon: Marcina MillardParaschos, Alexander, MD;  Location: [facility] INVASIVE CV LAB;  Service: Cardiovascular;  Laterality: N/A;  ? ?Social History:  reports that she has never smoked. She has never used smokeless tobacco. She reports that she does not currently use alcohol. She reports that she does not use drugs. ? ?Allergies   ?Allergen Reactions  ? Lisinopril Other (See Comments)  ?  Family is unaware of this allergy   ? ? ?History reviewed. No pertinent family history. ? ?Prior to Admission medications   ?Medication Sig Start Date End Date Taking? Authorizing Provider  ?aspirin 81 MG EC tablet Take by mouth.    [provider]  ?ferrous sulfate 325 (65 FE) MG tablet Take 1 tablet (325 mg total) by mouth daily with breakfast. ?Patient taking differently: Take 325 mg by mouth every other day. 10/25/20   Arnetha CourserAmin, Sumayya, MD  ?omeprazole (PRILOSEC) 20 MG capsule Take 20 mg by mouth daily. 04/17/20   [provider]  ?vitamin B-12 (CYANOCOBALAMIN) 1000 MCG tablet Take 1,000 mcg by mouth daily.    [provider]  ? ? ?Physical Exam: ?Vitals:  ? 07/24/21 1932 07/24/21 1934 07/24/21 2100 07/24/21 2115  ?BP: (!) 149/97  137/87   ?Pulse: (!) 113  83 81  ?Resp: 19  17   ?Temp: 97.7 ?Osborn (36.5 ?C)     ?TempSrc: Oral     ?SpO2: 92%  97%   ?Weight:  45.4 kg    ?Height:  5\' 3"  (1.6 m)    ?Physical Exam ?Vitals reviewed.  ?HENT:  ?   Head: Normocephalic and atraumatic.  ?   Right Ear: External ear normal.  ?   Left Ear: External ear normal.  ?Cardiovascular:  ?   Rate and Rhythm: Normal rate and regular rhythm.  ?  Pulses: Normal pulses.  ?   Heart sounds: Normal heart sounds.  ?Pulmonary:  ?   Effort: Pulmonary effort is normal.  ?   Breath sounds: Normal breath sounds.  ?Abdominal:  ?   General: Bowel sounds are normal. There is no distension.  ?   Palpations: Abdomen is soft. There is no mass.  ?   Tenderness: There is no abdominal tenderness. There is no guarding.  ?   Hernia: No hernia is present.  ?Musculoskeletal:  ?   Right lower leg: No edema.  ?   Left lower leg: No edema.  ?Skin: ?   General: Skin is warm.  ?Neurological:  ?   Cranial Nerves: No cranial nerve deficit.  ? ? ?Data Reviewed: ?Results for orders placed or performed during the hospital encounter of 07/24/21 (from the past 24 hour(s))  ?Basic metabolic  panel     Status: Abnormal  ? Collection Time: 07/24/21  7:38 PM  ?Result Value Ref Range  ? Sodium 136 135 - 145 mmol/L  ? Potassium 3.6 3.5 - 5.1 mmol/L  ? Chloride 100 98 - 111 mmol/L  ? CO2 28 22 - 32 mmol/L  ? Glucose, Bld 181 (H) 70 - 99 mg/dL  ? BUN 37 (H) 8 - 23 mg/dL  ? Creatinine, Ser 1.08 (H) 0.44 - 1.00 mg/dL  ? Calcium 9.0 8.9 - 10.3 mg/dL  ? GFR, Estimated 48 (L) >60 mL/min  ? Anion gap 8 5 - 15  ?CBC with Differential     Status: None  ? Collection Time: 07/24/21  7:38 PM  ?Result Value Ref Range  ? WBC 6.4 4.0 - 10.5 K/uL  ? RBC 4.31 3.87 - 5.11 MIL/uL  ? Hemoglobin 12.2 12.0 - 15.0 g/dL  ? HCT 38.3 36.0 - 46.0 %  ? MCV 88.9 80.0 - 100.0 fL  ? MCH 28.3 26.0 - 34.0 pg  ? MCHC 31.9 30.0 - 36.0 g/dL  ? RDW 13.9 11.5 - 15.5 %  ? Platelets 154 150 - 400 K/uL  ? nRBC 0.0 0.0 - 0.2 %  ? Neutrophils Relative % 72 %  ? Neutro Abs 4.6 1.7 - 7.7 K/uL  ? Lymphocytes Relative 14 %  ? Lymphs Abs 0.9 0.7 - 4.0 K/uL  ? Monocytes Relative 11 %  ? Monocytes Absolute 0.7 0.1 - 1.0 K/uL  ? Eosinophils Relative 2 %  ? Eosinophils Absolute 0.1 0.0 - 0.5 K/uL  ? Basophils Relative 1 %  ? Basophils Absolute 0.1 0.0 - 0.1 K/uL  ? Immature Granulocytes 0 %  ? Abs Immature Granulocytes 0.02 0.00 - 0.07 K/uL  ? ?Assessment and Plan: ?* Frequent falls ?Elderly pt with h/o dementia and mechanical fall.  ?consider placement. Pt/ot.  ? ?Closed left hip fracture (HCC) ?Pt was walking and fell on left side.  ?No loc.I  Ed found to have left Intertrochanteric left hip fracture, ?Dr. Joice Lofts consulted. ?  ? ?Atrial fibrillation and flutter (HCC) ?Pt is on eliquis and will change to heparin per pharmacy protocol for anticipated sx plan.  ?Dr Gust Rung has been consulted.  ? ?Benign essential HTN ?Blood pressure 137/87, pulse 81, temperature 97.7 ?Osborn (36.5 ?C), temperature source Oral, resp. rate 17, height 5\' 3"  (1.6 m), weight 45.4 kg, SpO2 97 %. ?bp control is good we will continue monitoring.  ? ?AKI: ?Lab Results  ?Component Value Date  ?  CREATININE 1.08 (H) 07/24/2021  ? CREATININE 0.76 10/25/2020  ? CREATININE 0.88 10/24/2020  ? ?We will  start pt on low rate MIVF and follow and avoid contrast.  ?Advance Care Planning:  ?  Code Status: Full Code  ? ? ?Consults:  ?Dr. Joice Lofts ? ?Family Communication:  ? [name] (Daughter)  ?[phone] ([place])  ? ? ?Severity of Illness: ?The appropriate patient status for this patient is INPATIENT. Inpatient status is judged to be reasonable and necessary in order to provide the required intensity of service to ensure the patient's safety. The patient's presenting symptoms, physical exam findings, and initial radiographic and laboratory data in the context of their chronic comorbidities is felt to place them at high risk for further clinical deterioration. Furthermore, it is not anticipated that the patient will be medically stable for discharge from the hospital within 2 midnights of admission.  ? ?* I certify that at the point of admission it is my clinical judgment that the patient will require inpatient hospital care spanning beyond 2 midnights from the point of admission due to high intensity of service, high risk for further deterioration and high frequency of surveillance required.* ? ?Author: ?Gertha Calkin, MD ?07/25/2021 1:28 AM ? ?For on call review [email or web address].  ?

## 2021-07-24 NOTE — ED Notes (Signed)
This RN called xray to let them know that pt is ready; no answer. ?

## 2021-07-24 NOTE — ED Notes (Signed)
This RN called & spoke with lab re: blood work sent when pt arrived to ER; call was disconnected. ?

## 2021-07-25 DIAGNOSIS — I509 Heart failure, unspecified: Secondary | ICD-10-CM | POA: Diagnosis present

## 2021-07-25 DIAGNOSIS — I110 Hypertensive heart disease with heart failure: Secondary | ICD-10-CM | POA: Diagnosis present

## 2021-07-25 DIAGNOSIS — I482 Chronic atrial fibrillation, unspecified: Secondary | ICD-10-CM

## 2021-07-25 DIAGNOSIS — Z7982 Long term (current) use of aspirin: Secondary | ICD-10-CM | POA: Diagnosis not present

## 2021-07-25 DIAGNOSIS — M25551 Pain in right hip: Secondary | ICD-10-CM | POA: Diagnosis present

## 2021-07-25 DIAGNOSIS — Z681 Body mass index (BMI) 19 or less, adult: Secondary | ICD-10-CM | POA: Diagnosis not present

## 2021-07-25 DIAGNOSIS — R636 Underweight: Secondary | ICD-10-CM | POA: Diagnosis present

## 2021-07-25 DIAGNOSIS — R296 Repeated falls: Secondary | ICD-10-CM | POA: Diagnosis present

## 2021-07-25 DIAGNOSIS — I10 Essential (primary) hypertension: Secondary | ICD-10-CM | POA: Diagnosis not present

## 2021-07-25 DIAGNOSIS — W108XXA Fall (on) (from) other stairs and steps, initial encounter: Secondary | ICD-10-CM | POA: Diagnosis present

## 2021-07-25 DIAGNOSIS — Z96641 Presence of right artificial hip joint: Secondary | ICD-10-CM | POA: Diagnosis present

## 2021-07-25 DIAGNOSIS — E785 Hyperlipidemia, unspecified: Secondary | ICD-10-CM | POA: Diagnosis present

## 2021-07-25 DIAGNOSIS — Z66 Do not resuscitate: Secondary | ICD-10-CM | POA: Diagnosis present

## 2021-07-25 DIAGNOSIS — Y9301 Activity, walking, marching and hiking: Secondary | ICD-10-CM | POA: Diagnosis present

## 2021-07-25 DIAGNOSIS — I4892 Unspecified atrial flutter: Secondary | ICD-10-CM | POA: Diagnosis present

## 2021-07-25 DIAGNOSIS — M25552 Pain in left hip: Secondary | ICD-10-CM | POA: Diagnosis present

## 2021-07-25 DIAGNOSIS — M810 Age-related osteoporosis without current pathological fracture: Secondary | ICD-10-CM | POA: Diagnosis present

## 2021-07-25 DIAGNOSIS — Z20822 Contact with and (suspected) exposure to covid-19: Secondary | ICD-10-CM | POA: Diagnosis present

## 2021-07-25 DIAGNOSIS — S72142A Displaced intertrochanteric fracture of left femur, initial encounter for closed fracture: Secondary | ICD-10-CM | POA: Diagnosis present

## 2021-07-25 DIAGNOSIS — F03911 Unspecified dementia, unspecified severity, with agitation: Secondary | ICD-10-CM | POA: Diagnosis present

## 2021-07-25 DIAGNOSIS — Y92008 Other place in unspecified non-institutional (private) residence as the place of occurrence of the external cause: Secondary | ICD-10-CM | POA: Diagnosis not present

## 2021-07-25 DIAGNOSIS — E876 Hypokalemia: Secondary | ICD-10-CM | POA: Diagnosis present

## 2021-07-25 DIAGNOSIS — S72002A Fracture of unspecified part of neck of left femur, initial encounter for closed fracture: Secondary | ICD-10-CM

## 2021-07-25 DIAGNOSIS — Z90710 Acquired absence of both cervix and uterus: Secondary | ICD-10-CM | POA: Diagnosis not present

## 2021-07-25 DIAGNOSIS — I739 Peripheral vascular disease, unspecified: Secondary | ICD-10-CM | POA: Diagnosis present

## 2021-07-25 DIAGNOSIS — Z79899 Other long term (current) drug therapy: Secondary | ICD-10-CM | POA: Diagnosis not present

## 2021-07-25 DIAGNOSIS — N179 Acute kidney failure, unspecified: Secondary | ICD-10-CM | POA: Diagnosis present

## 2021-07-25 DIAGNOSIS — I495 Sick sinus syndrome: Secondary | ICD-10-CM | POA: Diagnosis present

## 2021-07-25 LAB — BASIC METABOLIC PANEL
Anion gap: 10 (ref 5–15)
BUN: 33 mg/dL — ABNORMAL HIGH (ref 8–23)
CO2: 26 mmol/L (ref 22–32)
Calcium: 9 mg/dL (ref 8.9–10.3)
Chloride: 99 mmol/L (ref 98–111)
Creatinine, Ser: 0.93 mg/dL (ref 0.44–1.00)
GFR, Estimated: 58 mL/min — ABNORMAL LOW (ref >60)
Glucose, Bld: 143 mg/dL — ABNORMAL HIGH (ref 70–99)
Potassium: 3.1 mmol/L — ABNORMAL LOW (ref 3.5–5.1)
Sodium: 135 mmol/L (ref 135–145)

## 2021-07-25 LAB — APTT
aPTT: 29 seconds (ref 24–36)
aPTT: 43 seconds — ABNORMAL HIGH (ref 24–36)

## 2021-07-25 LAB — PROTIME-INR
INR: 2.2 — ABNORMAL HIGH (ref 0.8–1.2)
Prothrombin Time: 24.5 seconds — ABNORMAL HIGH (ref 11.4–15.2)

## 2021-07-25 LAB — CBC
HCT: 35.8 % — ABNORMAL LOW (ref 36.0–46.0)
Hemoglobin: 11.6 g/dL — ABNORMAL LOW (ref 12.0–15.0)
MCH: 28.4 pg (ref 26.0–34.0)
MCHC: 32.4 g/dL (ref 30.0–36.0)
MCV: 87.7 fL (ref 80.0–100.0)
Platelets: 150 10*3/uL (ref 150–400)
RBC: 4.08 MIL/uL (ref 3.87–5.11)
RDW: 13.8 % (ref 11.5–15.5)
WBC: 11.7 10*3/uL — ABNORMAL HIGH (ref 4.0–10.5)
nRBC: 0 % (ref 0.0–0.2)

## 2021-07-25 LAB — TYPE AND SCREEN
ABO/RH(D): O POS
Antibody Screen: NEGATIVE

## 2021-07-25 LAB — MAGNESIUM: Magnesium: 1.7 mg/dL (ref 1.7–2.4)

## 2021-07-25 LAB — HEPARIN LEVEL (UNFRACTIONATED)
Heparin Unfractionated: <0.1 IU/mL — ABNORMAL LOW (ref 0.30–0.70)
Heparin Unfractionated: 0.22 IU/mL — ABNORMAL LOW (ref 0.30–0.70)

## 2021-07-25 LAB — CBG MONITORING, ED
Glucose-Capillary: 136 mg/dL — ABNORMAL HIGH (ref 70–99)
Glucose-Capillary: 142 mg/dL — ABNORMAL HIGH (ref 70–99)

## 2021-07-25 LAB — GLUCOSE, CAPILLARY: Glucose-Capillary: 117 mg/dL — ABNORMAL HIGH (ref 70–99)

## 2021-07-25 MED ORDER — DOCUSATE SODIUM 100 MG PO CAPS
100.0000 mg | ORAL_CAPSULE | Freq: Two times a day (BID) | ORAL | Status: DC
  Administered 2021-07-25 – 2021-07-27 (×4): 100 mg via ORAL
  Filled 2021-07-25 (×7): qty 1

## 2021-07-25 MED ORDER — SODIUM CHLORIDE 0.9 % IV SOLN: INTRAVENOUS | Status: AC

## 2021-07-25 MED ORDER — HYDROCODONE-ACETAMINOPHEN 5-325 MG PO TABS
1.0000 | ORAL_TABLET | Freq: Four times a day (QID) | ORAL | Status: DC | PRN
  Administered 2021-07-25 – 2021-07-27 (×4): 2 via ORAL
  Administered 2021-07-29: 1 via ORAL
  Filled 2021-07-25 (×2): qty 2
  Filled 2021-07-25: qty 1
  Filled 2021-07-25 (×2): qty 2
  Filled 2021-07-25: qty 1

## 2021-07-25 MED ORDER — POTASSIUM CHLORIDE 10 MEQ/100ML IV SOLN
10.0000 meq | INTRAVENOUS | Status: AC
  Administered 2021-07-25 (×4): 10 meq via INTRAVENOUS
  Filled 2021-07-25 (×2): qty 100

## 2021-07-25 MED ORDER — INSULIN ASPART 100 UNIT/ML IJ SOLN
0.0000 [IU] | Freq: Three times a day (TID) | INTRAMUSCULAR | Status: DC
  Administered 2021-07-25 – 2021-07-27 (×5): 1 [IU] via SUBCUTANEOUS
  Administered 2021-07-28: 2 [IU] via SUBCUTANEOUS
  Filled 2021-07-25 (×5): qty 1

## 2021-07-25 MED ORDER — MORPHINE SULFATE (PF) 2 MG/ML IV SOLN
2.0000 mg | INTRAVENOUS | Status: DC | PRN
  Administered 2021-07-25 – 2021-07-26 (×7): 2 mg via INTRAVENOUS
  Filled 2021-07-25 (×7): qty 1

## 2021-07-25 MED ORDER — METHOCARBAMOL 500 MG PO TABS
500.0000 mg | ORAL_TABLET | Freq: Four times a day (QID) | ORAL | Status: DC | PRN
  Filled 2021-07-25: qty 1

## 2021-07-25 MED ORDER — POLYETHYLENE GLYCOL 3350 17 G PO PACK: 17.0000 g | PACK | Freq: Every day | ORAL | Status: DC | PRN

## 2021-07-25 MED ORDER — POTASSIUM CHLORIDE 10 MEQ/100ML IV SOLN
10.0000 meq | INTRAVENOUS | Status: DC
Start: 2021-07-25 — End: 2021-07-25

## 2021-07-25 MED ORDER — CEFAZOLIN SODIUM-DEXTROSE 2-4 GM/100ML-% IV SOLN
2.0000 g | INTRAVENOUS | Status: AC
  Administered 2021-07-26: 2 g via INTRAVENOUS

## 2021-07-25 MED ORDER — LABETALOL HCL 5 MG/ML IV SOLN
10.0000 mg | INTRAVENOUS | Status: DC | PRN
  Administered 2021-07-25 (×3): 10 mg via INTRAVENOUS
  Filled 2021-07-25 (×3): qty 4

## 2021-07-25 MED ORDER — DEXTROSE 5 % IV SOLN
500.0000 mg | Freq: Four times a day (QID) | INTRAVENOUS | Status: DC | PRN
  Filled 2021-07-25: qty 5

## 2021-07-25 MED ORDER — HEPARIN (PORCINE) 25000 UT/250ML-% IV SOLN
650.0000 [IU]/h | INTRAVENOUS | Status: DC
  Administered 2021-07-25: 650 [IU]/h via INTRAVENOUS
  Filled 2021-07-25: qty 250

## 2021-07-25 MED ORDER — POTASSIUM CHLORIDE CRYS ER 20 MEQ PO TBCR: 40.0000 meq | EXTENDED_RELEASE_TABLET | Freq: Once | ORAL | Status: DC

## 2021-07-25 MED ORDER — ASPIRIN EC 81 MG PO TBEC
81.0000 mg | DELAYED_RELEASE_TABLET | Freq: Every day | ORAL | Status: DC
  Administered 2021-07-25 – 2021-07-29 (×4): 81 mg via ORAL
  Filled 2021-07-25 (×4): qty 1

## 2021-07-25 MED ORDER — BISACODYL 5 MG PO TBEC
5.0000 mg | DELAYED_RELEASE_TABLET | Freq: Every day | ORAL | Status: DC | PRN
  Administered 2021-07-28: 5 mg via ORAL
  Filled 2021-07-25: qty 1

## 2021-07-25 MED ORDER — HYDRALAZINE HCL 20 MG/ML IJ SOLN
10.0000 mg | INTRAMUSCULAR | Status: DC | PRN
  Administered 2021-07-25 – 2021-07-26 (×2): 10 mg via INTRAVENOUS
  Filled 2021-07-25 (×2): qty 1

## 2021-07-25 MED ORDER — METOPROLOL TARTRATE 25 MG PO TABS
25.0000 mg | ORAL_TABLET | Freq: Two times a day (BID) | ORAL | Status: DC
  Administered 2021-07-25 – 2021-07-29 (×8): 25 mg via ORAL
  Filled 2021-07-25 (×9): qty 1

## 2021-07-25 MED ORDER — POTASSIUM CHLORIDE CRYS ER 20 MEQ PO TBCR
40.0000 meq | EXTENDED_RELEASE_TABLET | Freq: Once | ORAL | Status: AC
  Administered 2021-07-25: 40 meq via ORAL
  Filled 2021-07-25: qty 2

## 2021-07-25 NOTE — Assessment & Plan Note (Addendum)
-   follows with Dr. Nehemiah Massed, last OV 04/12/21  ?- not on anticoagulation due to high fall risk ?-Okay to discontinue heparin drip and resume aspirin.  Confirmed with daughter (per pharmacist) that patient only on aspirin, not on anticoagulation ?- resume lopressor; only on "daily" at home but RVR on admission so will adjust to BID for now and modify as needed ?-Patient reverted back to RVR while in the OR.  Will need to actively address heart rate and modify regimen as needed ?

## 2021-07-25 NOTE — Assessment & Plan Note (Addendum)
-   History of right hip fracture repair as well ?

## 2021-07-25 NOTE — Assessment & Plan Note (Addendum)
-   resume home lopressor ?- PRN hydralazine and lopressor ordered also  ? ?

## 2021-07-25 NOTE — Progress Notes (Signed)
?Progress Note ? ? ? ?Kristen Osborn   ?TML:[id number]  ?DOB: 1927/04/12  ?DOA: 07/24/2021     0 ?PCP: Danella Penton, MD ? ?Initial CC: fall at home ? ?Hospital Course: ?Kristen Osborn is a 88 yo female with advanced dementia, chronic afib, CHF, PAD, SSS s/p pacer, HTN, HLD, moderate mitral regurgitation, osteoporosis who presented after a fall at home.  She is cared for by her daughters who rotate staying the night with the patient. ?X-ray on admission showed left intertrochanteric hip fracture.  She was admitted for orthopedic surgery evaluation and pain control. ? ?Interval History:  ?Seen this morning in the ER with daughter bedside.  Patient has remained a fall risk due to her ongoing impulsiveness and dementia.  Patient was in some pain this morning and also noted to be in A-fib with RVR.  Medications adjusted and heart rate did respond fairly well. ? ?Assessment and Plan: ?* Closed left hip fracture (HCC) ?- S/p mechanical fall at home likely with dementia contributing as family states she falls frequently ?- Imaging shows left intertrochanteric hip fracture ?- Follow-up orthopedic surgery evaluation ?- Continue pain control ?  ? ?Benign essential HTN ?- BP uncontrolled this am ?- resume home lopressor ?- PRN hydralazine and lopressor ordered also  ? ? ?Chronic a-fib (HCC) ?- follows with Dr. Gwen Pounds, last OV 04/12/21  ?- not on anticoagulation due to high fall risk ?-Okay to discontinue heparin drip and resume aspirin.  Confirmed with daughter (per pharmacist) that patient only on aspirin, not on anticoagulation ?- resume lopressor; only on "daily" at home but RVR on admission so will adjust to BID for now and modify as needed ? ?Frequent falls ?- Per family, has been falling semifrequently due to impulsiveness and underlying dementia ?- Continue fall precautions ?- PT/OT evals after surgery if happens ? ?Right hip pain-resolved as of 07/25/2021 ?- History of right hip fracture repair as well ? ? ? ?Old records  reviewed in assessment of this patient ? ?Antimicrobials: ? ? ?DVT prophylaxis:  ?SCDs Start: 07/25/21 0024 ? ? ?Code Status:   Code Status: DNR ? ?Disposition Plan:  likely SNF ?Status is: Inpt ? ?Objective: ?Blood pressure (!) 170/97, pulse (!) 101, temperature 97.7 ?F (36.5 ?C), temperature source Oral, resp. rate 18, height 5\' 3"  (1.6 m), weight 45.4 kg, SpO2 94 %.  ?Examination:  ?Physical Exam ?Constitutional:   ?   Comments: Thin and chronically ill-appearing elderly woman laying in bed appearing uncomfortable but no distress  ?HENT:  ?   Head: Normocephalic and atraumatic.  ?   Mouth/Throat:  ?   Mouth: Mucous membranes are moist.  ?Eyes:  ?   Extraocular Movements: Extraocular movements intact.  ?Cardiovascular:  ?   Rate and Rhythm: Tachycardia present. Rhythm irregular.  ?Pulmonary:  ?   Effort: Pulmonary effort is normal.  ?   Breath sounds: Normal breath sounds.  ?Abdominal:  ?   General: Bowel sounds are normal. There is no distension.  ?   Palpations: Abdomen is soft.  ?   Tenderness: There is no abdominal tenderness.  ?Musculoskeletal:     ?   General: Tenderness present. No swelling or deformity.  ?   Cervical back: Normal range of motion and neck supple.  ?Skin: ?   General: Skin is warm and dry.  ?Neurological:  ?   Mental Status: She is disoriented.  ?   Comments: Underlying chronic dementia appreciated  ?  ? ?Consultants:  ?Orthopedic surgery ? ?Procedures:  ? ? ?  Data Reviewed: ?Results for orders placed or performed during the hospital encounter of 07/24/21 (from the past 24 hour(s))  ?Basic metabolic panel     Status: Abnormal  ? Collection Time: 07/24/21  7:38 PM  ?Result Value Ref Range  ? Sodium 136 135 - 145 mmol/L  ? Potassium 3.6 3.5 - 5.1 mmol/L  ? Chloride 100 98 - 111 mmol/L  ? CO2 28 22 - 32 mmol/L  ? Glucose, Bld 181 (H) 70 - 99 mg/dL  ? BUN 37 (H) 8 - 23 mg/dL  ? Creatinine, Ser 1.08 (H) 0.44 - 1.00 mg/dL  ? Calcium 9.0 8.9 - 10.3 mg/dL  ? GFR, Estimated 48 (L) >60 mL/min  ? Anion  gap 8 5 - 15  ?CBC with Differential     Status: None  ? Collection Time: 07/24/21  7:38 PM  ?Result Value Ref Range  ? WBC 6.4 4.0 - 10.5 K/uL  ? RBC 4.31 3.87 - 5.11 MIL/uL  ? Hemoglobin 12.2 12.0 - 15.0 g/dL  ? HCT 38.3 36.0 - 46.0 %  ? MCV 88.9 80.0 - 100.0 fL  ? MCH 28.3 26.0 - 34.0 pg  ? MCHC 31.9 30.0 - 36.0 g/dL  ? RDW 13.9 11.5 - 15.5 %  ? Platelets 154 150 - 400 K/uL  ? nRBC 0.0 0.0 - 0.2 %  ? Neutrophils Relative % 72 %  ? Neutro Abs 4.6 1.7 - 7.7 K/uL  ? Lymphocytes Relative 14 %  ? Lymphs Abs 0.9 0.7 - 4.0 K/uL  ? Monocytes Relative 11 %  ? Monocytes Absolute 0.7 0.1 - 1.0 K/uL  ? Eosinophils Relative 2 %  ? Eosinophils Absolute 0.1 0.0 - 0.5 K/uL  ? Basophils Relative 1 %  ? Basophils Absolute 0.1 0.0 - 0.1 K/uL  ? Immature Granulocytes 0 %  ? Abs Immature Granulocytes 0.02 0.00 - 0.07 K/uL  ?Protime-INR     Status: Abnormal  ? Collection Time: 07/25/21 12:32 AM  ?Result Value Ref Range  ? Prothrombin Time 24.5 (H) 11.4 - 15.2 seconds  ? INR 2.2 (H) 0.8 - 1.2  ?Heparin level (unfractionated)     Status: Abnormal  ? Collection Time: 07/25/21 12:32 AM  ?Result Value Ref Range  ? Heparin Unfractionated <0.10 (L) 0.30 - 0.70 IU/mL  ?APTT     Status: None  ? Collection Time: 07/25/21 12:32 AM  ?Result Value Ref Range  ? aPTT 29 24 - 36 seconds  ?CBC     Status: Abnormal  ? Collection Time: 07/25/21  1:45 AM  ?Result Value Ref Range  ? WBC 11.7 (H) 4.0 - 10.5 K/uL  ? RBC 4.08 3.87 - 5.11 MIL/uL  ? Hemoglobin 11.6 (L) 12.0 - 15.0 g/dL  ? HCT 35.8 (L) 36.0 - 46.0 %  ? MCV 87.7 80.0 - 100.0 fL  ? MCH 28.4 26.0 - 34.0 pg  ? MCHC 32.4 30.0 - 36.0 g/dL  ? RDW 13.8 11.5 - 15.5 %  ? Platelets 150 150 - 400 K/uL  ? nRBC 0.0 0.0 - 0.2 %  ?Basic metabolic panel     Status: Abnormal  ? Collection Time: 07/25/21  1:45 AM  ?Result Value Ref Range  ? Sodium 135 135 - 145 mmol/L  ? Potassium 3.1 (L) 3.5 - 5.1 mmol/L  ? Chloride 99 98 - 111 mmol/L  ? CO2 26 22 - 32 mmol/L  ? Glucose, Bld 143 (H) 70 - 99 mg/dL  ? BUN 33 (H) 8  - 23  mg/dL  ? Creatinine, Ser 0.93 0.44 - 1.00 mg/dL  ? Calcium 9.0 8.9 - 10.3 mg/dL  ? GFR, Estimated 58 (L) >60 mL/min  ? Anion gap 10 5 - 15  ?Magnesium     Status: None  ? Collection Time: 07/25/21  1:45 AM  ?Result Value Ref Range  ? Magnesium 1.7 1.7 - 2.4 mg/dL  ?Type and screen [facility]     Status: None  ? Collection Time: 07/25/21  1:46 AM  ?Result Value Ref Range  ? ABO/RH(D) O POS   ? Antibody Screen NEG   ? Sample Expiration    ?  07/28/2021,2359 ?Performed at [facility], [address] [place], [place] [id number] ?  ?CBG monitoring, ED     Status: Abnormal  ? Collection Time: 07/25/21  7:48 AM  ?Result Value Ref Range  ? Glucose-Capillary 136 (H) 70 - 99 mg/dL  ?Heparin level (unfractionated)     Status: Abnormal  ? Collection Time: 07/25/21 10:00 AM  ?Result Value Ref Range  ? Heparin Unfractionated 0.22 (L) 0.30 - 0.70 IU/mL  ?APTT     Status: Abnormal  ? Collection Time: 07/25/21 10:00 AM  ?Result Value Ref Range  ? aPTT 43 (H) 24 - 36 seconds  ?  ?I have Reviewed nursing notes, Vitals, and Lab results since pt's last encounter. Pertinent lab results : see above ?I have ordered test including BMP, CBC, Mg ?I have reviewed the last note from staff over past 24 hours ?I have discussed pt's care plan and test results with nursing staff, case manager ? ? LOS: 0 days  ? ?Lewie Chamberavid Jaymes Revels, MD ?[facility] ?07/25/2021, 11:20 AM ? ?

## 2021-07-25 NOTE — Assessment & Plan Note (Addendum)
-   S/p mechanical fall at home likely with dementia contributing as family states she falls frequently ?- Imaging shows left intertrochanteric hip fracture ?-Undergoing surgical repair on 07/26/2021 ?-Family does wish to take patient home regardless at discharge after she recovers from surgery.  SNF declined ?- Continue pain control ?  ?

## 2021-07-25 NOTE — Assessment & Plan Note (Addendum)
-   Per family, has been falling semifrequently due to impulsiveness and underlying dementia ?- Continue fall precautions ?- PT/OT evals after surgery but family wishes for returning patient home at discharge ?

## 2021-07-25 NOTE — Progress Notes (Signed)
ANTICOAGULATION CONSULT NOTE - Initial Consult ? ?Pharmacy Consult for Heparin  ?Indication: atrial fibrillation/ Eliquis PTA  ? ?Allergies  ?Allergen Reactions  ? Lisinopril Other (See Comments)  ?  Family is unaware of this allergy   ? ? ?Patient Measurements: ?Height: 5\' 3"  (160 cm) ?Weight: 45.4 kg (100 lb 1.4 oz) ?IBW/kg (Calculated) : 52.4 ?Heparin Dosing Weight: 45.4 kg  ? ?Vital Signs: ?Temp: 97.7 ?F (36.5 ?C) (03/18 1932) ?Temp Source: Oral (03/18 1932) ?BP: 137/87 (03/18 2100) ?Pulse Rate: 81 (03/18 2115) ? ?Labs: ?Recent Labs  ?  07/24/21 ?1938  ?HGB 12.2  ?HCT 38.3  ?PLT 154  ?CREATININE 1.08*  ? ? ?Estimated Creatinine Clearance: 23.8 mL/min (A) (by C-G formula based on SCr of 1.08 mg/dL (H)). ? ? ?Medical History: ?Past Medical History:  ?Diagnosis Date  ? Atrial fibrillation (HCC)   ? CHF (congestive heart failure) (HCC)   ? Dementia (HCC)   ? ? ?Medications:  ?(Not in a hospital admission)  ? ?Assessment: ?Pharmacy consulted to dose heparin in this 88 year old female admitted for fall/hip fracture.  Pt was on Eliquis PTA for Afib (unspecified dose).  Anticipate surgery on 3/19 or 3/20.  ? ?- Last dose on Sat 3/18 @ 0900.  ? ?CrCl = 23.8 ml/min ? ?Goal of Therapy:  ?Heparin level 0.3-0.7 units/ml ?aPTT 66 - 102 seconds ?Monitor platelets by anticoagulation protocol: Yes ?  ?Plan:  ?Heparin drip ordered to start on 3/19 @ 650 units/hr; no bolus.  ?Will use aPTT to guide dosing until HL and aPTT correlate.  ?Will draw aPTT and HL 8 hrs after start of drip.  ? ?Candies Palm D ?07/25/2021,1:12 AM ? ? ?

## 2021-07-25 NOTE — ED Notes (Signed)
MD Girguis messaged at this time regarding pt elevated HR and BP.  ?

## 2021-07-25 NOTE — Consult Note (Signed)
ORTHOPAEDIC CONSULTATION ? ?REQUESTING PHYSICIAN: Lewie Chamber, MD ? ?Chief Complaint:   ?Left hip pain ? ?History of Present Illness: ?Kristen Osborn is a 88 y.o. female with a history of congestive heart failure, chronic atrial fibrillation, and dementia who continues to live in her own home, and usually uses a walker to get around, although, according to the family, she frequently will forget to use her walker.  Apparently, the patient was in her usual state of health last evening when she lost her balance and fell, injuring her left hip.  She was brought to the emergency room where x-rays demonstrated a mildly displaced intertrochanteric fracture of the left hip.  The patient has been admitted at this time for medical stabilization and definitive management of her injury.  The patient apparently did not sustain any associated injuries, nor did she strike her head or lose consciousness.  She is not competent to state whether she had any episodes of lightheadedness, dizziness, chest pain, shortness of breath, or other symptoms which may have contributed to her fall. ? ?Past Medical History:  ?Diagnosis Date  ? Atrial fibrillation (HCC)   ? CHF (congestive heart failure) (HCC)   ? Dementia (HCC)   ? ?Past Surgical History:  ?Procedure Laterality Date  ? ABDOMINAL HYSTERECTOMY    ? CLOSED REDUCTION WRIST FRACTURE Right 06/28/2020  ? Procedure: CLOSED REDUCTION WRIST;  Surgeon: Lyndle Herrlich, MD;  Location: [facility] ORS;  Service: Orthopedics;  Laterality: Right;  ? HIP ARTHROPLASTY Right 06/28/2020  ? Procedure: ARTHROPLASTY BIPOLAR HIP (HEMIARTHROPLASTY);  Surgeon: Lyndle Herrlich, MD;  Location: [facility] ORS;  Service: Orthopedics;  Laterality: Right;  ? ORIF PERIPROSTHETIC FRACTURE Right 08/03/2020  ? Procedure: OPEN REDUCTION INTERNAL FIXATION (ORIF) PERIPROSTHETIC FRACTURE;  Surgeon: Lyndle Herrlich, MD;  Location: [facility] ORS;  Service: Orthopedics;   Laterality: Right;  ? PPM GENERATOR CHANGEOUT N/A 03/30/2021  ? Procedure: PPM GENERATOR CHANGEOUT;  Surgeon: Marcina Millard, MD;  Location: [facility];  Service: Cardiovascular;  Laterality: N/A;  ? ?Social History  ? ?Socioeconomic History  ? Marital status: Married  ?  Spouse name: Not on file  ? Number of children: Not on file  ? Years of education: Not on file  ? Highest education level: Not on file  ?Occupational History  ? Not on file  ?Tobacco Use  ? Smoking status: Never  ? Smokeless tobacco: Never  ?Vaping Use  ? Vaping Use: Never used  ?Substance and Sexual Activity  ? Alcohol use: Not Currently  ? Drug use: Never  ? Sexual activity: Not Currently  ?Other Topics Concern  ? Not on file  ?Social History Narrative  ? lives with daughter  ? ?Social Determinants of Health  ? ?Financial Resource Strain: Not on file  ?Food Insecurity: Not on file  ?Transportation Needs: Not on file  ?Physical Activity: Not on file  ?Stress: Not on file  ?Social Connections: Not on file  ? ?History reviewed. No pertinent family history. ?Allergies  ?Allergen Reactions  ? Lisinopril Other (See Comments)  ?  Family is unaware of this allergy   ? ?Prior to Admission medications   ?Medication Sig Start Date End Date Taking? Authorizing Provider  ?aspirin 81 MG EC tablet Take by mouth.   Yes [provider]  ?divalproex (DEPAKOTE) 125 MG DR tablet Take 125 mg by mouth in the morning, at noon, and at bedtime. 07/16/21  Yes [provider]  ?ferrous sulfate 325 (65 FE) MG tablet Take 1 tablet (  325 mg total) by mouth daily with breakfast. ?Patient taking differently: Take 325 mg by mouth every other day. 10/25/20  Yes Arnetha Courser, MD  ?metoprolol tartrate (LOPRESSOR) 25 MG tablet Take 25 mg by mouth daily. 06/07/21  Yes [provider]  ?omeprazole (PRILOSEC) 20 MG capsule Take 20 mg by mouth daily. 04/17/20  Yes [provider]  ?potassium chloride (KLOR-CON) 8 MEQ tablet Take 8 mEq by  mouth daily. 06/18/21  Yes [provider]  ?vitamin B-12 (CYANOCOBALAMIN) 1000 MCG tablet Take 1,000 mcg by mouth daily.   Yes [provider]  ? ?DG Hip Unilat W or Wo Pelvis 2-3 Views Left ? ?Result Date: 07/24/2021 ?CLINICAL DATA:  Fall, left hip pain EXAM: DG HIP (WITH OR WITHOUT PELVIS) 2-3V LEFT COMPARISON:  None. FINDINGS: Intertrochanteric left hip fracture, minimally displaced. Right hip arthroplasty with cerclage wire fixation. Visualized bony pelvis appears intact. Vascular calcifications. IMPRESSION: Intertrochanteric left hip fracture, as above. Electronically Signed   By: Charline Bills M.D.   On: 07/24/2021 22:03   ? ?Positive ROS: All other systems have been reviewed and were otherwise negative with the exception of those mentioned in the HPI and as above. ? ?Physical Exam: ?General:  Patient is resting comfortably in bed ?Psychiatric:  Patient is not competent for consent ?Cardiovascular:  No pedal edema ?Respiratory:  No wheezing, non-labored breathing ?GI:  Abdomen is soft and non-tender ?Skin:  No lesions in the area of chief complaint ?Neurologic:  Sensation intact distally ?Lymphatic:  No axillary or cervical lymphadenopathy ? ?Orthopedic Exam:  ?Orthopedic examination is limited to the left hip and lower extremity.  The left lower extremity is somewhat shortened and externally rotated as compared to the right leg.  Skin inspection around the left hip is notable for mild swelling, but otherwise is unremarkable.  No ecchymosis, erythema, abrasions, or other skin abnormalities are identified.  She has mild tenderness to palpation around the lateral aspect of the left hip.  She has more severe pain with any attempted active or passive motion of the left lower extremity.  She is grossly neurovascularly intact to the left lower extremity and foot. ? ?X-rays:  ?Recent x-rays of the pelvis and left hip are available for review and have been reviewed by myself.  These films  demonstrate a mildly displaced 2 part intertrochanteric fracture of the left hip.  No significant degenerative changes of the left hip joint are noted.  No lytic lesions or other acute bony abnormalities are identified. ? ?Assessment: ?Displaced intertrochanteric fracture, left hip. ? ?Plan: ?The treatment options, including both surgical and nonsurgical choices, have been discussed in detail with the patient and her family who is at the bedside.  The patient and her family would like to proceed with surgical intervention to include an intramedullary nailing of the displaced intertrochanteric fracture of the left hip.  The risks (including bleeding, infection, nerve and/or blood vessel injury, persistent or recurrent pain, loosening or failure of the components, leg length inequality, dislocation, need for further surgery, blood clots, strokes, heart attacks or arrhythmias, pneumonia, etc.) and benefits of the surgical procedure were discussed.  The patient and her family state their understanding and agree to proceed.  A formal written consent will be obtained by the nursing staff. ? ?Thank you for asking me to participate in the care of this most unfortunate woman.  I will be happy to follow her with you. ? ? ?J. Derald Macleod, MD ? ?Beeper #:  [phone] ? ?  07/25/2021 ?3:21 PM ? ?

## 2021-07-25 NOTE — Hospital Course (Signed)
Ms. Bottger is a 88 yo female with advanced dementia, chronic afib, CHF, PAD, SSS s/p pacer, HTN, HLD, moderate mitral regurgitation, osteoporosis who presented after a fall at home.  She is cared for by her daughters who rotate staying the night with the patient. ?X-ray on admission showed left intertrochanteric hip fracture.  She was admitted for orthopedic surgery evaluation and pain control. ?

## 2021-07-26 ENCOUNTER — Inpatient Hospital Stay: Payer: Medicare Other

## 2021-07-26 ENCOUNTER — Inpatient Hospital Stay: Payer: Medicare Other | Admitting: Anesthesiology

## 2021-07-26 ENCOUNTER — Encounter
Admission: EM | Disposition: A | Discharge status: Home Health | Payer: Self-pay | Source: Home / Self Care | Attending: Internal Medicine

## 2021-07-26 ENCOUNTER — Other Ambulatory Visit: Payer: Self-pay

## 2021-07-26 HISTORY — PX: INTRAMEDULLARY (IM) NAIL INTERTROCHANTERIC: SHX5875

## 2021-07-26 LAB — CBC WITH DIFFERENTIAL/PLATELET
Abs Immature Granulocytes: 0.04 10*3/uL (ref 0.00–0.07)
Basophils Absolute: 0 10*3/uL (ref 0.0–0.1)
Basophils Relative: 0 %
Eosinophils Absolute: 0.1 10*3/uL (ref 0.0–0.5)
Eosinophils Relative: 1 %
HCT: 34.8 % — ABNORMAL LOW (ref 36.0–46.0)
Hemoglobin: 11.2 g/dL — ABNORMAL LOW (ref 12.0–15.0)
Immature Granulocytes: 0 %
Lymphocytes Relative: 13 %
Lymphs Abs: 1.2 10*3/uL (ref 0.7–4.0)
MCH: 28.5 pg (ref 26.0–34.0)
MCHC: 32.2 g/dL (ref 30.0–36.0)
MCV: 88.5 fL (ref 80.0–100.0)
Monocytes Absolute: 1.4 10*3/uL — ABNORMAL HIGH (ref 0.1–1.0)
Monocytes Relative: 15 %
Neutro Abs: 6.8 10*3/uL (ref 1.7–7.7)
Neutrophils Relative %: 71 %
Platelets: 137 10*3/uL — ABNORMAL LOW (ref 150–400)
RBC: 3.93 MIL/uL (ref 3.87–5.11)
RDW: 14.2 % (ref 11.5–15.5)
WBC: 9.5 10*3/uL (ref 4.0–10.5)
nRBC: 0 % (ref 0.0–0.2)

## 2021-07-26 LAB — BASIC METABOLIC PANEL
Anion gap: 11 (ref 5–15)
BUN: 32 mg/dL — ABNORMAL HIGH (ref 8–23)
CO2: 24 mmol/L (ref 22–32)
Calcium: 9.1 mg/dL (ref 8.9–10.3)
Chloride: 100 mmol/L (ref 98–111)
Creatinine, Ser: 0.97 mg/dL (ref 0.44–1.00)
GFR, Estimated: 55 mL/min — ABNORMAL LOW (ref >60)
Glucose, Bld: 126 mg/dL — ABNORMAL HIGH (ref 70–99)
Potassium: 4.2 mmol/L (ref 3.5–5.1)
Sodium: 135 mmol/L (ref 135–145)

## 2021-07-26 LAB — GLUCOSE, CAPILLARY
Glucose-Capillary: 133 mg/dL — ABNORMAL HIGH (ref 70–99)
Glucose-Capillary: 148 mg/dL — ABNORMAL HIGH (ref 70–99)
Glucose-Capillary: 194 mg/dL — ABNORMAL HIGH (ref 70–99)

## 2021-07-26 LAB — MAGNESIUM: Magnesium: 1.8 mg/dL (ref 1.7–2.4)

## 2021-07-26 LAB — PROTIME-INR
INR: 1.2 (ref 0.8–1.2)
Prothrombin Time: 15.1 seconds (ref 11.4–15.2)

## 2021-07-26 LAB — APTT: aPTT: 27 seconds (ref 24–36)

## 2021-07-26 SURGERY — FIXATION, FRACTURE, INTERTROCHANTERIC, WITH INTRAMEDULLARY ROD
Anesthesia: General | Site: Hip | Laterality: Left

## 2021-07-26 MED ORDER — LIDOCAINE HCL 4 % EX SOLN
CUTANEOUS | Status: DC | PRN
  Administered 2021-07-26: 2 mL via TOPICAL

## 2021-07-26 MED ORDER — LABETALOL HCL 5 MG/ML IV SOLN
INTRAVENOUS | Status: AC
  Administered 2021-07-26: 10 mg via INTRAVENOUS
  Filled 2021-07-26: qty 4

## 2021-07-26 MED ORDER — ACETAMINOPHEN 10 MG/ML IV SOLN
INTRAVENOUS | Status: DC | PRN
  Administered 2021-07-26: 1000 mg via INTRAVENOUS

## 2021-07-26 MED ORDER — SODIUM CHLORIDE 0.9 % IV SOLN: INTRAVENOUS | Status: DC

## 2021-07-26 MED ORDER — MORPHINE SULFATE (PF) 2 MG/ML IV SOLN: 2.0000 mg | INTRAVENOUS | Status: DC | PRN

## 2021-07-26 MED ORDER — METOPROLOL TARTRATE 5 MG/5ML IV SOLN
INTRAVENOUS | Status: AC
  Filled 2021-07-26: qty 5

## 2021-07-26 MED ORDER — ESMOLOL HCL 100 MG/10ML IV SOLN
INTRAVENOUS | Status: AC
  Filled 2021-07-26: qty 10

## 2021-07-26 MED ORDER — 0.9 % SODIUM CHLORIDE (POUR BTL) OPTIME
TOPICAL | Status: DC | PRN
  Administered 2021-07-26: 1000 mL

## 2021-07-26 MED ORDER — ACETAMINOPHEN 325 MG PO TABS: 325.0000 mg | ORAL_TABLET | Freq: Four times a day (QID) | ORAL | Status: DC | PRN

## 2021-07-26 MED ORDER — LABETALOL HCL 5 MG/ML IV SOLN
INTRAVENOUS | Status: DC | PRN
  Administered 2021-07-26: 5 mg via INTRAVENOUS

## 2021-07-26 MED ORDER — FENTANYL CITRATE (PF) 100 MCG/2ML IJ SOLN
INTRAMUSCULAR | Status: DC | PRN
  Administered 2021-07-26 (×2): 25 ug via INTRAVENOUS

## 2021-07-26 MED ORDER — LABETALOL HCL 5 MG/ML IV SOLN
10.0000 mg | INTRAVENOUS | Status: AC | PRN
  Administered 2021-07-26: 10 mg via INTRAVENOUS

## 2021-07-26 MED ORDER — ENOXAPARIN SODIUM 30 MG/0.3ML IJ SOSY
30.0000 mg | PREFILLED_SYRINGE | INTRAMUSCULAR | Status: DC
  Administered 2021-07-28 – 2021-07-29 (×2): 30 mg via SUBCUTANEOUS
  Filled 2021-07-26 (×3): qty 0.3

## 2021-07-26 MED ORDER — DILTIAZEM HCL-DEXTROSE 125-5 MG/125ML-% IV SOLN (PREMIX)
INTRAVENOUS | Status: AC
  Filled 2021-07-26: qty 125

## 2021-07-26 MED ORDER — ONDANSETRON HCL 4 MG/2ML IJ SOLN
INTRAMUSCULAR | Status: DC | PRN
  Administered 2021-07-26: 4 mg via INTRAVENOUS

## 2021-07-26 MED ORDER — BISACODYL 10 MG RE SUPP
10.0000 mg | Freq: Every day | RECTAL | Status: DC | PRN
  Administered 2021-07-29: 10 mg via RECTAL
  Filled 2021-07-26: qty 1

## 2021-07-26 MED ORDER — ONDANSETRON HCL 4 MG PO TABS: 4.0000 mg | ORAL_TABLET | Freq: Four times a day (QID) | ORAL | Status: DC | PRN

## 2021-07-26 MED ORDER — METOCLOPRAMIDE HCL 10 MG PO TABS: 5.0000 mg | ORAL_TABLET | Freq: Three times a day (TID) | ORAL | Status: DC | PRN

## 2021-07-26 MED ORDER — DEXAMETHASONE SODIUM PHOSPHATE 10 MG/ML IJ SOLN
INTRAMUSCULAR | Status: AC
  Filled 2021-07-26: qty 1

## 2021-07-26 MED ORDER — ESMOLOL HCL 100 MG/10ML IV SOLN
INTRAVENOUS | Status: DC | PRN
  Administered 2021-07-26 (×2): 30 mg via INTRAVENOUS

## 2021-07-26 MED ORDER — SUGAMMADEX SODIUM 200 MG/2ML IV SOLN
INTRAVENOUS | Status: DC | PRN
  Administered 2021-07-26: 300 mg via INTRAVENOUS

## 2021-07-26 MED ORDER — METOPROLOL TARTRATE 5 MG/5ML IV SOLN
INTRAVENOUS | Status: DC | PRN
  Administered 2021-07-26: 1 mg via INTRAVENOUS
  Administered 2021-07-26: 2 mg via INTRAVENOUS

## 2021-07-26 MED ORDER — PROPOFOL 10 MG/ML IV BOLUS
INTRAVENOUS | Status: DC | PRN
  Administered 2021-07-26: 40 mg via INTRAVENOUS

## 2021-07-26 MED ORDER — MAGNESIUM HYDROXIDE 400 MG/5ML PO SUSP
30.0000 mL | Freq: Every day | ORAL | Status: DC | PRN
  Administered 2021-07-27: 30 mL via ORAL
  Filled 2021-07-26: qty 30

## 2021-07-26 MED ORDER — ACETAMINOPHEN 10 MG/ML IV SOLN
INTRAVENOUS | Status: AC
Start: 2021-07-26 — End: ?
  Filled 2021-07-26: qty 200

## 2021-07-26 MED ORDER — SUGAMMADEX SODIUM 500 MG/5ML IV SOLN
INTRAVENOUS | Status: AC
  Filled 2021-07-26: qty 5

## 2021-07-26 MED ORDER — DEXAMETHASONE SODIUM PHOSPHATE 10 MG/ML IJ SOLN
INTRAMUSCULAR | Status: DC | PRN
  Administered 2021-07-26: 5 mg via INTRAVENOUS

## 2021-07-26 MED ORDER — ONDANSETRON HCL 4 MG/2ML IJ SOLN
INTRAMUSCULAR | Status: AC
  Filled 2021-07-26: qty 2

## 2021-07-26 MED ORDER — CEFAZOLIN SODIUM-DEXTROSE 2-4 GM/100ML-% IV SOLN
2.0000 g | Freq: Three times a day (TID) | INTRAVENOUS | Status: AC
  Administered 2021-07-27: 2 g via INTRAVENOUS
  Filled 2021-07-26 (×2): qty 100

## 2021-07-26 MED ORDER — FENTANYL CITRATE (PF) 100 MCG/2ML IJ SOLN
INTRAMUSCULAR | Status: AC
  Filled 2021-07-26: qty 2

## 2021-07-26 MED ORDER — LIDOCAINE HCL (CARDIAC) PF 100 MG/5ML IV SOSY
PREFILLED_SYRINGE | INTRAVENOUS | Status: DC | PRN
  Administered 2021-07-26: 50 mg via INTRAVENOUS

## 2021-07-26 MED ORDER — FLEET ENEMA 7-19 GM/118ML RE ENEM: 1.0000 | ENEMA | Freq: Once | RECTAL | Status: DC | PRN

## 2021-07-26 MED ORDER — FENTANYL CITRATE (PF) 100 MCG/2ML IJ SOLN: 25.0000 ug | INTRAMUSCULAR | Status: DC | PRN

## 2021-07-26 MED ORDER — ALBUMIN HUMAN 5 % IV SOLN
INTRAVENOUS | Status: AC
  Filled 2021-07-26: qty 250

## 2021-07-26 MED ORDER — AMLODIPINE BESYLATE 10 MG PO TABS
10.0000 mg | ORAL_TABLET | Freq: Every day | ORAL | Status: DC
  Administered 2021-07-26 – 2021-07-29 (×4): 10 mg via ORAL
  Filled 2021-07-26 (×4): qty 1

## 2021-07-26 MED ORDER — DOCUSATE SODIUM 100 MG PO CAPS: 100.0000 mg | ORAL_CAPSULE | Freq: Two times a day (BID) | ORAL | Status: DC

## 2021-07-26 MED ORDER — ROCURONIUM BROMIDE 100 MG/10ML IV SOLN
INTRAVENOUS | Status: DC | PRN
  Administered 2021-07-26: 30 mg via INTRAVENOUS
  Administered 2021-07-26 (×2): 10 mg via INTRAVENOUS

## 2021-07-26 MED ORDER — METOCLOPRAMIDE HCL 5 MG/ML IJ SOLN: 5.0000 mg | Freq: Three times a day (TID) | INTRAMUSCULAR | Status: DC | PRN

## 2021-07-26 MED ORDER — ROCURONIUM BROMIDE 10 MG/ML (PF) SYRINGE
PREFILLED_SYRINGE | INTRAVENOUS | Status: AC
  Filled 2021-07-26: qty 10

## 2021-07-26 MED ORDER — PROPOFOL 10 MG/ML IV BOLUS
INTRAVENOUS | Status: AC
  Filled 2021-07-26: qty 20

## 2021-07-26 MED ORDER — BUPIVACAINE-EPINEPHRINE (PF) 0.5% -1:200000 IJ SOLN
INTRAMUSCULAR | Status: AC
  Filled 2021-07-26: qty 30

## 2021-07-26 MED ORDER — ACETAMINOPHEN 500 MG PO TABS
500.0000 mg | ORAL_TABLET | Freq: Four times a day (QID) | ORAL | Status: DC
  Administered 2021-07-27: 500 mg via ORAL
  Filled 2021-07-26 (×3): qty 1

## 2021-07-26 MED ORDER — CEFAZOLIN SODIUM-DEXTROSE 2-4 GM/100ML-% IV SOLN
INTRAVENOUS | Status: AC
  Administered 2021-07-26: 2 g via INTRAVENOUS
  Filled 2021-07-26: qty 100

## 2021-07-26 MED ORDER — PHENYLEPHRINE 40 MCG/ML (10ML) SYRINGE FOR IV PUSH (FOR BLOOD PRESSURE SUPPORT)
PREFILLED_SYRINGE | INTRAVENOUS | Status: DC | PRN
  Administered 2021-07-26: 240 ug via INTRAVENOUS
  Administered 2021-07-26: 160 ug via INTRAVENOUS
  Administered 2021-07-26: 80 ug via INTRAVENOUS

## 2021-07-26 MED ORDER — BUPIVACAINE-EPINEPHRINE (PF) 0.5% -1:200000 IJ SOLN
INTRAMUSCULAR | Status: DC | PRN
  Administered 2021-07-26: 30 mL

## 2021-07-26 MED ORDER — ACETAMINOPHEN 10 MG/ML IV SOLN: 650.0000 mg | Freq: Once | INTRAVENOUS | Status: DC | PRN

## 2021-07-26 MED ORDER — LIDOCAINE HCL (PF) 2 % IJ SOLN
INTRAMUSCULAR | Status: AC
  Filled 2021-07-26: qty 5

## 2021-07-26 MED ORDER — LACTATED RINGERS IV SOLN: INTRAVENOUS | Status: DC | PRN

## 2021-07-26 MED ORDER — DILTIAZEM LOAD VIA INFUSION
INTRAVENOUS | Status: DC | PRN
  Administered 2021-07-26: 2 mg via INTRAVENOUS
  Administered 2021-07-26: 5 mg via INTRAVENOUS
  Administered 2021-07-26: 2 mg via INTRAVENOUS
  Administered 2021-07-26: 1 mg via INTRAVENOUS

## 2021-07-26 MED ORDER — DIPHENHYDRAMINE HCL 12.5 MG/5ML PO ELIX
12.5000 mg | ORAL_SOLUTION | ORAL | Status: DC | PRN
  Administered 2021-07-28 (×2): 12.5 mg via ORAL
  Filled 2021-07-26 (×2): qty 5

## 2021-07-26 MED ORDER — ALBUMIN HUMAN 5 % IV SOLN: INTRAVENOUS | Status: DC | PRN

## 2021-07-26 MED ORDER — ONDANSETRON HCL 4 MG/2ML IJ SOLN: 4.0000 mg | Freq: Four times a day (QID) | INTRAMUSCULAR | Status: DC | PRN

## 2021-07-26 SURGICAL SUPPLY — 45 items
BIT DRILL 4.3MMS DISTAL GRDTED (BIT) IMPLANT
BNDG COHESIVE 4X5 TAN ST LF (GAUZE/BANDAGES/DRESSINGS) ×2 IMPLANT
BNDG COHESIVE 6X5 TAN ST LF (GAUZE/BANDAGES/DRESSINGS) ×2 IMPLANT
CHLORAPREP W/TINT 26 (MISCELLANEOUS) ×4 IMPLANT
DRAPE 3/4 80X56 (DRAPES) ×2 IMPLANT
DRAPE C-ARMOR (DRAPES) ×2 IMPLANT
DRILL 4.3MMS DISTAL GRADUATED (BIT) ×2
DRSG MEPILEX SACRM 8.7X9.8 (GAUZE/BANDAGES/DRESSINGS) ×2 IMPLANT
DRSG OPSITE POSTOP 3X4 (GAUZE/BANDAGES/DRESSINGS) IMPLANT
DRSG OPSITE POSTOP 4X6 (GAUZE/BANDAGES/DRESSINGS) IMPLANT
ELECT CAUTERY BLADE 6.4 (BLADE) ×2 IMPLANT
ELECT REM PT RETURN 9FT ADLT (ELECTROSURGICAL) ×2
ELECTRODE REM PT RTRN 9FT ADLT (ELECTROSURGICAL) ×1 IMPLANT
GAUZE SPONGE 4X4 12PLY STRL (GAUZE/BANDAGES/DRESSINGS) ×2 IMPLANT
GLOVE SURG ENC MOIS LTX SZ8 (GLOVE) ×4 IMPLANT
GLOVE SURG UNDER LTX SZ8 (GLOVE) ×2 IMPLANT
GOWN STRL REUS W/ TWL LRG LVL3 (GOWN DISPOSABLE) ×1 IMPLANT
GOWN STRL REUS W/ TWL XL LVL3 (GOWN DISPOSABLE) ×1 IMPLANT
GOWN STRL REUS W/TWL LRG LVL3 (GOWN DISPOSABLE) ×1
GOWN STRL REUS W/TWL XL LVL3 (GOWN DISPOSABLE) ×1
GUIDEPIN VERSANAIL DSP 3.2X444 (ORTHOPEDIC DISPOSABLE SUPPLIES) ×1 IMPLANT
GUIDEWIRE NATURAL NAIL 3X100 (WIRE) ×1 IMPLANT
HIP FRAC NAIL LAG SCR 10.5X100 (Orthopedic Implant) ×1 IMPLANT
HIP FRAC NAIL LEFT 11X360MM (Orthopedic Implant) ×2 IMPLANT
MANIFOLD NEPTUNE II (INSTRUMENTS) ×2 IMPLANT
MAT ABSORB  FLUID 56X50 GRAY (MISCELLANEOUS) ×1
MAT ABSORB FLUID 56X50 GRAY (MISCELLANEOUS) ×1 IMPLANT
NAIL HIP FRAC LEFT 11X360MM (Orthopedic Implant) IMPLANT
NDL FILTER BLUNT 18X1 1/2 (NEEDLE) ×1 IMPLANT
NEEDLE FILTER BLUNT 18X 1/2SAF (NEEDLE) ×1
NEEDLE FILTER BLUNT 18X1 1/2 (NEEDLE) ×1 IMPLANT
NEEDLE HYPO 22GX1.5 SAFETY (NEEDLE) ×2 IMPLANT
NS IRRIG 500ML POUR BTL (IV SOLUTION) ×2 IMPLANT
PACK HIP COMPR (MISCELLANEOUS) ×2 IMPLANT
SCREW BONE CORTICAL 5.0X40 (Screw) ×1 IMPLANT
SCREW CANN THRD AFF 10.5X100 (Orthopedic Implant) IMPLANT
STAPLER SKIN PROX 35W (STAPLE) ×2 IMPLANT
STRAP SAFETY 5IN WIDE (MISCELLANEOUS) ×2 IMPLANT
SUT VIC AB 0 CT1 36 (SUTURE) ×2 IMPLANT
SUT VIC AB 1 CT1 36 (SUTURE) ×2 IMPLANT
SUT VIC AB 2-0 CT1 (SUTURE) ×4 IMPLANT
SYR 10ML LL (SYRINGE) ×2 IMPLANT
SYR 30ML LL (SYRINGE) ×2 IMPLANT
TAPE MICROFOAM 4IN (TAPE) ×2 IMPLANT
WATER STERILE IRR 500ML POUR (IV SOLUTION) ×2 IMPLANT

## 2021-07-26 NOTE — Progress Notes (Signed)
Initial Nutrition Assessment ? ?DOCUMENTATION CODES:  ? ?Underweight ? ?INTERVENTION:  ? ?-Once diet is advanced, add:  ? ?-Ensure Enlive po BID, each supplement provides 350 kcal and 20 grams of protein.  ?-MVI with minerals daily ? ?NUTRITION DIAGNOSIS:  ? ?Increased nutrient needs related to post-op healing as evidenced by NPO status. ? ?GOAL:  ? ?Patient will meet greater than or equal to 90% of their needs ? ?MONITOR:  ? ?PO intake, Supplement acceptance, Diet advancement, Labs, Weight trends, Skin, I & O's ? ?REASON FOR ASSESSMENT:  ? ?Consult ?Assessment of nutrition requirement/status, Hip fracture protocol ? ?ASSESSMENT:  ? ?Kristen Osborn is a 88 yo female with advanced dementia, chronic afib, CHF, PAD, SSS s/p pacer, HTN, HLD, moderate mitral regurgitation, osteoporosis who presented after a fall at home.  She is cared for by her daughters who rotate staying the night with the patient. ? ?Pt admitted with closed lt hip fracture.  ? ?Reviewed I/O's: +230 ml x 24 hours ? ?UOP: 870 ml x 24 hours  ? ?Per orthopedics notes, pt currently NPO for procedure today.  ? ?Pt unavailable at time of visit. Pt being transported to OR at time of visit. RD unable to obtain further nutrition-related history or complete nutrition-focused physical exam at this time.   ? ?Reviewed wt hx; wt has been stable over the past 9 months.  ? ?Pt with increased nutritional needs for post-op healing and would benefit from addition of oral nutrition supplements.  ? ?Medications reviewed and include colace.  ? ?Lab Results  ?Component Value Date  ? HGBA1C 5.2 08/05/2020  ? PTA DM medications are none.  ? ?Labs reviewed: CBGS: 117-142 (inpatient orders for glycemic control are 0-9 units insulin aspart TID with meals).   ? ?Diet Order:   ?Diet Order   ? ? None  ? ?  ? ? ?EDUCATION NEEDS:  ? ?No education needs have been identified at this time ? ?Skin:  Skin Assessment: Skin Integrity Issues: ?Skin Integrity Issues:: Incisions ?Incisions: closed  lt hip ? ?Last BM:  07/23/21 ? ?Height:  ? ?Ht Readings from Last 1 Encounters:  ?07/24/21 5\' 3"  (1.6 m)  ? ? ?Weight:  ? ?Wt Readings from Last 1 Encounters:  ?07/24/21 45.4 kg  ? ? ?Ideal Body Weight:  52.3 kg ? ?BMI:  Body mass index is 17.73 kg/m?. ? ?Estimated Nutritional Needs:  ? ?Kcal:  1350-1550 ? ?Protein:  55-70 grams ? ?Fluid:  > 1.4 L ? ? ? ?07/26/21, RD, LDN, CDCES ?Registered Dietitian II ?Certified Diabetes Care and Education Specialist ?Please refer to [facility] for RD and/or RD on-call/weekend/after hours pager  ?

## 2021-07-26 NOTE — TOC Progression Note (Signed)
Transition of Care (TOC) - Progression Note  ? ? ?Patient Details  ?Name: Kristen Osborn ?MRN: [id number] ?Date of Birth: 1927/02/06 ? ?Transition of Care (TOC) CM/SW Contact  ?Conception Oms, RN ?Phone Number: ?07/26/2021, 3:19 PM ? ?Clinical Narrative:   Spoke with the daughter at the bedside, the family will take her ome and care for her, she has advanced dementia, they would like a hospital bed, they decline a hoyer lift and any other DME, she will need EMS to transport home ? ? ? ?  ?  ? ?Expected Discharge Plan and Services ?  ?  ?  ?  ?  ?                ?  ?  ?  ?  ?  ?  ?  ?  ?  ?  ? ? ?Social Determinants of Health (SDOH) Interventions ?  ? ?Readmission Risk Interventions ?Readmission Risk Prevention Plan 08/05/2020 08/04/2020  ?Transportation Screening Complete Complete  ?PCP or Specialist Appt within 3-5 Days Complete Complete  ?[place] or Home Care Consult Complete Complete  ?Social Work Consult for [place] Planning/Counseling Complete Complete  ?Palliative Care Screening Not Applicable Not Applicable  ?Medication Review Press photographer) Complete Complete  ?Some recent data might be hidden  ? ? ?

## 2021-07-26 NOTE — Op Note (Signed)
07/26/2021 ? ?2:33 PM ? ?Patient:   Kristen Osborn ? ?Pre-Op Diagnosis:   Closed displaced 2 part intertrochanteric fracture, left hip. ? ?Post-Op Diagnosis:   Same ? ?Procedure:   Reduction and internal fixation of displaced 2 part intertrochanteric left hip fracture with Biomet Affixis TFN nail. ? ?Surgeon:   Maryagnes Amos, MD ? ?Assistant:   None ? ?Anesthesia:   GET ? ?Findings:   As above ? ?Complications:   None ? ?EBL:   50 cc ? ?Fluids:   500 cc crystalloid ? ?UOP:   120 cc ? ?TT:   None ? ?Drains:   None ? ?Closure:   Staples ? ?Implants:   Biomet Affixis 11 x 360 mm TFN with a 100 mm lag screw and a 40 mm distal interlocking screw ? ?Brief Clinical Note:   The patient is a 88 year old female who sustained the above-noted injury 2 days ago when she apparently lost her balance and fell in her home. She was brought to the emergency room where x-rays demonstrated the above-noted injury. The patient has been cleared medically and presents at this time for reduction and internal fixation of the displaced intertrochanteric left hip fracture. ? ?Procedure:   The patient was brought into the operating room and lain in the supine position.  After adequate general endotracheal intubation and anesthesia were obtained, the patient was transferred to the fracture table. The uninjured leg was placed in a flexed and abducted position while the injured lower extremity was placed in longitudinal traction. The fracture was reduced using longitudinal traction and internal rotation. The adequacy of reduction was verified fluoroscopically in AP and lateral projections and found to be near anatomic. The lateral aspects of the left hip and thigh were prepped with ChloraPrep solution before being draped sterilely. Preoperative antibiotics were administered. A timeout was performed to verify the appropriate surgical site.  ? ?The greater trochanter was identified fluoroscopically and an approximately 3 cm incision made about 2-3  fingerbreadths above the tip of the greater trochanter. The incision was carried down through the subcutaneous tissues to expose the gluteal fascia. This was split the length of the incision, providing access to the tip of the trochanter. Under fluoroscopic guidance, a guidewire was drilled through the tip of the trochanter into the proximal metaphysis to the level of the lesser trochanter. After verifying its position fluoroscopically in AP and lateral projections, it was overreamed with the initial reamer to the depth of the lesser trochanter. A guidewire was passed down through the femoral canal to the supracondylar region. The adequacy of guidewire position was verified fluoroscopically in AP and lateral projections before the length of the guidewire within the canal was measured and found to be 375 mm. Therefore, a 360 mm length nail was selected. The guidewire was overreamed sequentially using the flexible reamers, beginning with a 10.0 mm reamer and progressing to a 12.5 mm reamer. This provided good cortical chatter. The 11 x 360 mm Biomet Affixis TFN rod was selected and advanced to the appropriate depth, as verified fluoroscopically.  ? ?The guide system for the lag screw was positioned and advanced through an approximately 2 cm stab incision over the lateral aspect of the proximal femur. The guidewire was drilled up through the trochanteric femoral nail and into the femoral neck to rest within 5 mm of subchondral bone. After verifying its position in the femoral neck and head in both AP and lateral projections, the guidewire was measured and found to be optimally  replicated by a 100 mm lag screw. The guidewire was overreamed to the appropriate depth before the lag screw was inserted and advanced to the appropriate depth as verified fluoroscopically in AP and lateral projections. Tension was removed from the leg and the compression device inserted to provide compression across the fracture. The locking  screw was advanced, then backed off a quarter turn to set the lag screw. Again the adequacy of hardware position and fracture reduction was verified fluoroscopically in AP and lateral projections and found to be excellent. ? ?Attention was directed distally. Using the "perfect circle" technique, the leg and fluoroscopy machine were positioned appropriately. An approximately 1.5 cm stab incision was made over the skin at the appropriate point before the drill bit was advanced through the cortex and across the static hole of the nail. The appropriate length of the screw was determined before the 40 mm distal interlocking screw was positioned, then advanced and tightened securely. Again the adequacy of screw position was verified fluoroscopically in AP and lateral projections and found to be excellent. ? ?The wounds were irrigated thoroughly with sterile saline solution before the abductor fascia was reapproximated using #1 Vicryl interrupted sutures. The subcutaneous tissues were closed using 2-0 Vicryl interrupted sutures. The skin was closed using staples. A total of 30 cc of 0.5% Sensorcaine with epinephrine was injected in and around all incisions. Sterile occlusive dressings were applied to all wounds before the patient was awakened, extubated, and transferred back to her hospital bed. The patient was then returned to the recovery room in satisfactory condition after tolerating the procedure well. ?

## 2021-07-26 NOTE — Anesthesia Postprocedure Evaluation (Signed)
Anesthesia Post Note ? ?Patient: Kristen Osborn ? ?Procedure(s) Performed: INTRAMEDULLARY (IM) NAIL INTERTROCHANTRIC (Left: Hip) ? ?Patient location during evaluation: PACU ?Anesthesia Type: General ?Level of consciousness: patient cooperative (back to preoperative baseline) ?Pain management: pain level controlled ?Vital Signs Assessment: post-procedure vital signs reviewed and stable ?Respiratory status: spontaneous breathing, nonlabored ventilation and respiratory function stable ?Cardiovascular status: blood pressure returned to baseline and stable ?Postop Assessment: adequate PO intake and no headache ?Anesthetic complications: no ? ? ?No notable events documented. ? ? ?Last Vitals:  ?Vitals:  ? 07/26/21 1550 07/26/21 1551  ?BP: (!) 142/74 (!) 142/92  ?Pulse: 75   ?Resp: 16   ?Temp:    ?SpO2: 97%   ?  ?Last Pain:  ?Vitals:  ? 07/26/21 1535  ?TempSrc:   ?PainSc: 0-No pain  ? ? ?  ?  ?  ?  ?  ?  ? ?Reed Breech ? ? ? ? ?

## 2021-07-26 NOTE — Transfer of Care (Signed)
Immediate Anesthesia Transfer of Care Note ? ?Patient: Kristen Osborn ? ?Procedure(s) Performed: INTRAMEDULLARY (IM) NAIL INTERTROCHANTRIC (Left: Hip) ? ?Patient Location: PACU ? ?Anesthesia Type:General ? ?Level of Consciousness: drowsy ? ?Airway & Oxygen Therapy: Patient Spontanous Breathing and Patient connected to face mask oxygen ? ?Post-op Assessment: Report given to RN and Post -op Vital signs reviewed and stable ? ?Post vital signs: Reviewed and stable ? ?Last Vitals:  ?Vitals Value Taken Time  ?BP 182/99 07/26/21 1450  ?Temp    ?Pulse 101 07/26/21 1451  ?Resp 18 07/26/21 1451  ?SpO2 99 % 07/26/21 1451  ?Vitals shown include unvalidated device data. ? ?Last Pain:  ?Vitals:  ? 07/26/21 1206  ?TempSrc: Oral  ?PainSc:   ?   ? ?  ? ?Complications: No notable events documented. ?

## 2021-07-26 NOTE — Anesthesia Procedure Notes (Signed)
Procedure Name: Intubation ?Date/Time: 07/26/2021 1:03 PM ?Performed by: Lynden Oxford, CRNA ?Pre-anesthesia Checklist: Patient identified, Emergency Drugs available, Suction available and Patient being monitored ?Patient Re-evaluated:Patient Re-evaluated prior to induction ?Oxygen Delivery Method: Circle system utilized ?Preoxygenation: Pre-oxygenation with 100% oxygen ?Induction Type: IV induction ?Ventilation: Mask ventilation without difficulty ?Laryngoscope Size: McGraph and 3 ?Grade View: Grade I ?Tube type: Oral ?Tube size: 6.5 mm ?Number of attempts: 1 ?Airway Equipment and Method: Stylet, Oral airway and Video-laryngoscopy ?Placement Confirmation: ETT inserted through vocal cords under direct vision, positive ETCO2 and breath sounds checked- equal and bilateral ?Secured at: 19 cm ?Tube secured with: Tape ?Dental Injury: Teeth and Oropharynx as per pre-operative assessment  ? ? ? ? ?

## 2021-07-26 NOTE — Anesthesia Preprocedure Evaluation (Addendum)
Anesthesia Evaluation  ?Patient identified by MRN, date of birth, ID band ?Patient confused ? ?General Assessment Comment:RR elevated. Pt with confusion to commands and unable to fully assess neuromuscular exam. Moves all extremities but does not participate fully in exam. Pt has eyes closed and is minimally responsive to commands.  ? ?Reviewed: ?Allergy & Precautions, H&P , NPO status , Patient's Chart, lab work & pertinent test results, reviewed documented beta blocker date and time  ? ?History of Anesthesia Complications ?Negative for: history of anesthetic complications ? ?Airway ?Mallampati: III ? ?TM Distance: >3 FB ?Neck ROM: full ? ? ? Dental ? ?(+) Poor Dentition ?  ?Pulmonary ?neg pulmonary ROS,  ? Tachypnea ? ? ? ? ? ? ? ? Cardiovascular ?Exercise Tolerance: Poor ?hypertension, On Medications and Pt. on home beta blockers ?+ Peripheral Vascular Disease and +CHF  ?+ dysrhythmias (SSS s/p pacer) Atrial Fibrillation + pacemaker (recent battery change out on 03/30/2021.) + Valvular Problems/Murmurs (moderate mitral regurgitation)  ?Rhythm:Irregular Rate:Normal ? ?ECHO 2022: ?INTERPRETATION  ?NORMAL LEFT VENTRICULAR SYSTOLIC FUNCTION ? WITH MODERATE LVH  ?MILD RV SYSTOLIC DYSFUNCTION (See above)  ?MODERATE VALVULAR REGURGITATION (See above)  ?NO VALVULAR STENOSIS  ?IRREGULAR HEART RHYTHM CAPTURED THROUGHOUT EXAM  ?ESTIMATED LVEF 50%  ?Aortic: MODERATE AI  ?MODERATELY THICKENED AOV LEAFLETS WITH NO EVIDENCE OF STENOSIS  ?Mitral: MODERATE MR  ?MODERATE MITRAL ANNULAR CALCIFICATION  ?Tricuspid:?MODERATE TO SEVERE TR (3.31m/s)  ?PACER WIRE NOTED IN RV/RA MAY BE UNDERESTIMATING TR GRADIENT  ?Pulmonic: MILD PI  ? ?  ?Neuro/Psych ?PSYCHIATRIC DISORDERS Dementia negative neurological ROS ?   ? GI/Hepatic ?Neg liver ROS, GERD  Medicated,  ?Endo/Other  ?negative endocrine ROS ? Renal/GU ?Renal InsufficiencyRenal disease  ?negative genitourinary ?  ?Musculoskeletal ? ? Abdominal ?Normal  abdominal exam  (+)   ?Peds ? Hematology ? ?(+) Blood dyscrasia, anemia , thrombocytopenia   ?Anesthesia Other Findings ?L Hip Fx ? ?Pt was brought to the operating room under the impression that she was rate controlled. However, she had RVR from afib with stable BP. Esmolol bolus's were not effective. A diltiazem bolus was administered with good response to HR 80-100's with stable BP. We proceeded with induction.  ? ? ?Past Medical History: ?No date: Atrial fibrillation ([place]) ?No date: CHF (congestive heart failure) ([place]) ?No date: Dementia [facility]) ? ?Past Surgical History: ?No date: ABDOMINAL HYSTERECTOMY ? ?BMI   ? Body Mass Index: 15.87 kg/m?  ?  ? ? Reproductive/Obstetrics ?negative OB ROS ? ?  ? ? ? ? ? ? ? ? ? ? ? ? ? ?  ?  ? ? ? ? ?Anesthesia Physical ? ?Anesthesia Plan ? ?ASA: III ? ?Anesthesia Plan: General  ? ?Post-op Pain Management: Regional block*  ? ?Induction: Intravenous ? ?PONV Risk Score and Plan: 2 and Ondansetron, Dexamethasone and Treatment may vary due to age or medical condition ? ?Airway Management Planned: Oral ETT ? ?Additional Equipment: None ? ?Intra-op Plan:  ? ?Post-operative Plan: Extubation in OR ? ?Informed Consent: I have reviewed the patients History and Physical, chart, labs and discussed the procedure including the risks, benefits and alternatives for the proposed anesthesia with the patient or authorized representative who has indicated his/her understanding and acceptance.  ? ?Patient has DNR.  ?Discussed DNR with power of attorney and Suspend DNR. ?  ?Dental Advisory Given and Consent reviewed with POA ? ?Plan Discussed with: CRNA and Anesthesiologist ? ?Anesthesia Plan Comments:   ? ? ? ? ? ?Anesthesia Quick Evaluation ? ?

## 2021-07-26 NOTE — Progress Notes (Signed)
Patient has  hip fracture, Bed bound, Dementia, which requires lower body to be positioned in ways not feasible with a normal bed. Head must be elevated at least 30 degrees or the patient may aspirate.  ?

## 2021-07-26 NOTE — Progress Notes (Signed)
Pt had foley placed yesterday.  Over course of night foley stopped producing output.  This nurse noticed urine was in bed.  Attempts to advance foley unsuccessful.  Neomia Glass NP notified and ordered new foley to be placed if current one could not be advanced.  Old foley was removed and new one placed.  Good urine return with the new foley and was secured. ?

## 2021-07-26 NOTE — Progress Notes (Signed)
?Progress Note ? ? ? ?Sarita Haver Quale   ?MLY:[id number]  ?DOB: May 14, 1926  ?DOA: 07/24/2021     1 ?PCP: Danella Penton, MD ? ?Initial CC: fall at home ? ?Hospital Course: ?Ms. Nater is a 88 yo female with advanced dementia, chronic afib, CHF, PAD, SSS s/p pacer, HTN, HLD, moderate mitral regurgitation, osteoporosis who presented after a fall at home.  She is cared for by her daughters who rotate staying the night with the patient. ?X-ray on admission showed left intertrochanteric hip fracture.  She was admitted for orthopedic surgery evaluation and pain control. ? ?Interval History:  ?No events overnight.  Family is present bedside this morning.  Seen before going down to the OR. ? ?Assessment and Plan: ?* Closed left hip fracture (HCC) ?- S/p mechanical fall at home likely with dementia contributing as family states she falls frequently ?- Imaging shows left intertrochanteric hip fracture ?-Undergoing surgical repair on 07/26/2021 ?-Family does wish to take patient home regardless at discharge after she recovers from surgery.  SNF declined ?- Continue pain control ?  ? ?Benign essential HTN ?- resume home lopressor ?- PRN hydralazine and lopressor ordered also  ? ? ?Chronic a-fib (HCC) ?- follows with Dr. Gwen Pounds, last OV 04/12/21  ?- not on anticoagulation due to high fall risk ?-Okay to discontinue heparin drip and resume aspirin.  Confirmed with daughter (per pharmacist) that patient only on aspirin, not on anticoagulation ?- resume lopressor; only on "daily" at home but RVR on admission so will adjust to BID for now and modify as needed ?-Patient reverted back to RVR while in the OR.  Will need to actively address heart rate and modify regimen as needed ? ?Frequent falls ?- Per family, has been falling semifrequently due to impulsiveness and underlying dementia ?- Continue fall precautions ?- PT/OT evals after surgery but family wishes for returning patient home at discharge ? ?Right hip pain-resolved as of  07/25/2021 ?- History of right hip fracture repair as well ? ? ? ?Old records reviewed in assessment of this patient ? ?Antimicrobials: ? ? ?DVT prophylaxis:  ?SCDs Start: 07/25/21 0024 ? ? ?Code Status:   Code Status: DNR ? ?Disposition Plan:  likely SNF ?Status is: Inpt ? ?Objective: ?Blood pressure (!) 133/104, pulse 92, temperature 98.4 ?F (36.9 ?C), temperature source Oral, resp. rate (!) 21, height 5\' 3"  (1.6 m), weight 45.4 kg, SpO2 90 %.  ?Examination:  ?Physical Exam ?Constitutional:   ?   Comments: Thin and chronically ill-appearing elderly woman laying in bed appearing uncomfortable but no distress  ?HENT:  ?   Head: Normocephalic and atraumatic.  ?   Mouth/Throat:  ?   Mouth: Mucous membranes are moist.  ?Eyes:  ?   Extraocular Movements: Extraocular movements intact.  ?Cardiovascular:  ?   Rate and Rhythm: Normal rate. Rhythm irregular.  ?Pulmonary:  ?   Effort: Pulmonary effort is normal.  ?   Breath sounds: Normal breath sounds.  ?Abdominal:  ?   General: Bowel sounds are normal. There is no distension.  ?   Palpations: Abdomen is soft.  ?   Tenderness: There is no abdominal tenderness.  ?Musculoskeletal:     ?   General: Tenderness present. No swelling or deformity.  ?   Cervical back: Normal range of motion and neck supple.  ?Skin: ?   General: Skin is warm and dry.  ?Neurological:  ?   Mental Status: She is disoriented.  ?   Comments: Underlying chronic dementia appreciated  ?  ? ?  Consultants:  ?Orthopedic surgery ? ?Procedures:  ?Reduction and internal fixation of displaced 2 part intertrochanteric left hip fracture, 07/26/21 ? ?Data Reviewed: ?Results for orders placed or performed during the hospital encounter of 07/24/21 (from the past 24 hour(s))  ?Glucose, capillary     Status: Abnormal  ? Collection Time: 07/25/21  5:01 PM  ?Result Value Ref Range  ? Glucose-Capillary 117 (H) 70 - 99 mg/dL  ?Basic metabolic panel     Status: Abnormal  ? Collection Time: 07/26/21  5:06 AM  ?Result Value Ref  Range  ? Sodium 135 135 - 145 mmol/L  ? Potassium 4.2 3.5 - 5.1 mmol/L  ? Chloride 100 98 - 111 mmol/L  ? CO2 24 22 - 32 mmol/L  ? Glucose, Bld 126 (H) 70 - 99 mg/dL  ? BUN 32 (H) 8 - 23 mg/dL  ? Creatinine, Ser 0.97 0.44 - 1.00 mg/dL  ? Calcium 9.1 8.9 - 10.3 mg/dL  ? GFR, Estimated 55 (L) >60 mL/min  ? Anion gap 11 5 - 15  ?CBC with Differential/Platelet     Status: Abnormal  ? Collection Time: 07/26/21  5:06 AM  ?Result Value Ref Range  ? WBC 9.5 4.0 - 10.5 K/uL  ? RBC 3.93 3.87 - 5.11 MIL/uL  ? Hemoglobin 11.2 (L) 12.0 - 15.0 g/dL  ? HCT 34.8 (L) 36.0 - 46.0 %  ? MCV 88.5 80.0 - 100.0 fL  ? MCH 28.5 26.0 - 34.0 pg  ? MCHC 32.2 30.0 - 36.0 g/dL  ? RDW 14.2 11.5 - 15.5 %  ? Platelets 137 (L) 150 - 400 K/uL  ? nRBC 0.0 0.0 - 0.2 %  ? Neutrophils Relative % 71 %  ? Neutro Abs 6.8 1.7 - 7.7 K/uL  ? Lymphocytes Relative 13 %  ? Lymphs Abs 1.2 0.7 - 4.0 K/uL  ? Monocytes Relative 15 %  ? Monocytes Absolute 1.4 (H) 0.1 - 1.0 K/uL  ? Eosinophils Relative 1 %  ? Eosinophils Absolute 0.1 0.0 - 0.5 K/uL  ? Basophils Relative 0 %  ? Basophils Absolute 0.0 0.0 - 0.1 K/uL  ? Immature Granulocytes 0 %  ? Abs Immature Granulocytes 0.04 0.00 - 0.07 K/uL  ?Magnesium     Status: None  ? Collection Time: 07/26/21  5:06 AM  ?Result Value Ref Range  ? Magnesium 1.8 1.7 - 2.4 mg/dL  ?Glucose, capillary     Status: Abnormal  ? Collection Time: 07/26/21  7:55 AM  ?Result Value Ref Range  ? Glucose-Capillary 133 (H) 70 - 99 mg/dL  ? Comment 1 Notify RN   ? Comment 2 Document in Chart   ?Protime-INR     Status: None  ? Collection Time: 07/26/21 10:50 AM  ?Result Value Ref Range  ? Prothrombin Time 15.1 11.4 - 15.2 seconds  ? INR 1.2 0.8 - 1.2  ?APTT     Status: None  ? Collection Time: 07/26/21 10:50 AM  ?Result Value Ref Range  ? aPTT 27 24 - 36 seconds  ?  ?I have Reviewed nursing notes, Vitals, and Lab results since pt's last encounter. Pertinent lab results : see above ?I have ordered test including BMP, CBC, Mg ?I have reviewed the  last note from staff over past 24 hours ?I have discussed pt's care plan and test results with nursing staff, case manager ? ? LOS: 1 day  ? ?Lewie Chamber, MD ?[facility] ?07/26/2021, 2:41 PM ? ?

## 2021-07-27 ENCOUNTER — Encounter: Payer: Self-pay | Admitting: Surgery

## 2021-07-27 LAB — CBC WITH DIFFERENTIAL/PLATELET
Abs Immature Granulocytes: 0.03 10*3/uL (ref 0.00–0.07)
Basophils Absolute: 0 10*3/uL (ref 0.0–0.1)
Basophils Relative: 0 %
Eosinophils Absolute: 0 10*3/uL (ref 0.0–0.5)
Eosinophils Relative: 0 %
HCT: 28.7 % — ABNORMAL LOW (ref 36.0–46.0)
Hemoglobin: 9.3 g/dL — ABNORMAL LOW (ref 12.0–15.0)
Immature Granulocytes: 0 %
Lymphocytes Relative: 5 %
Lymphs Abs: 0.5 10*3/uL — ABNORMAL LOW (ref 0.7–4.0)
MCH: 28.8 pg (ref 26.0–34.0)
MCHC: 32.4 g/dL (ref 30.0–36.0)
MCV: 88.9 fL (ref 80.0–100.0)
Monocytes Absolute: 1 10*3/uL (ref 0.1–1.0)
Monocytes Relative: 10 %
Neutro Abs: 8.5 10*3/uL — ABNORMAL HIGH (ref 1.7–7.7)
Neutrophils Relative %: 85 %
Platelets: 125 10*3/uL — ABNORMAL LOW (ref 150–400)
RBC: 3.23 MIL/uL — ABNORMAL LOW (ref 3.87–5.11)
RDW: 14.3 % (ref 11.5–15.5)
WBC: 10.1 10*3/uL (ref 4.0–10.5)
nRBC: 0 % (ref 0.0–0.2)

## 2021-07-27 LAB — BASIC METABOLIC PANEL
Anion gap: 8 (ref 5–15)
BUN: 43 mg/dL — ABNORMAL HIGH (ref 8–23)
CO2: 26 mmol/L (ref 22–32)
Calcium: 8.8 mg/dL — ABNORMAL LOW (ref 8.9–10.3)
Chloride: 103 mmol/L (ref 98–111)
Creatinine, Ser: 1.06 mg/dL — ABNORMAL HIGH (ref 0.44–1.00)
GFR, Estimated: 49 mL/min — ABNORMAL LOW (ref >60)
Glucose, Bld: 157 mg/dL — ABNORMAL HIGH (ref 70–99)
Potassium: 4 mmol/L (ref 3.5–5.1)
Sodium: 137 mmol/L (ref 135–145)

## 2021-07-27 LAB — GLUCOSE, CAPILLARY
Glucose-Capillary: 142 mg/dL — ABNORMAL HIGH (ref 70–99)
Glucose-Capillary: 126 mg/dL — ABNORMAL HIGH (ref 70–99)

## 2021-07-27 LAB — MAGNESIUM: Magnesium: 2 mg/dL (ref 1.7–2.4)

## 2021-07-27 MED ORDER — ENOXAPARIN SODIUM 40 MG/0.4ML IJ SOSY: 40.0000 mg | PREFILLED_SYRINGE | INTRAMUSCULAR | 0 refills | Status: DC

## 2021-07-27 MED ORDER — HALOPERIDOL LACTATE 5 MG/ML IJ SOLN
INTRAMUSCULAR | Status: AC
  Filled 2021-07-27: qty 1

## 2021-07-27 MED ORDER — HYDROCODONE-ACETAMINOPHEN 5-325 MG PO TABS: 1.0000 | ORAL_TABLET | Freq: Four times a day (QID) | ORAL | 0 refills | Status: DC | PRN

## 2021-07-27 MED ORDER — HALOPERIDOL LACTATE 5 MG/ML IJ SOLN
0.5000 mg | Freq: Four times a day (QID) | INTRAMUSCULAR | Status: DC | PRN
  Administered 2021-07-27 (×2): 0.5 mg via INTRAVENOUS
  Filled 2021-07-27: qty 1

## 2021-07-27 NOTE — Evaluation (Signed)
Physical Therapy Evaluation ?Patient Details ?Name: Kristen Osborn ?MRN: [id number] ?DOB: March 01, 1927 ?Today's Date: 07/27/2021 ? ?History of Present Illness ? Pt is a 88 y.o. female presenting to hospital 3/18 s/p mechanical fall c/o L hip pain.  Pt admitted with frequent falls, closed L hip fx, a-fib and flutter.  S/p ORIF of displaced 2 part intertrochanteric L hip fx 07/26/21. PMH includes dementia, a-fib on Eliquis, CHF s/p pacemaker, PAD, htn, R wrist fx 06/28/20, R hip arthroplasty 06/28/20, ORIF periprosthetic fx R 08/03/20.  ?Clinical Impression ? Prior to hospital admission, pt was ambulatory (sometimes used walker and sometimes no AD use); h/o falls and agitation; lives alone but has 24/7 assist from daughters who take turns staying with pt every 2 days.  Pt demonstrating confusion (pt with h/o dementia) and requiring assist for safety during sessions activities.  Currently pt is mod to max assist semi-supine to sitting edge of bed; mod assist progressing to close SBA with sitting balance for a few minutes edge of bed; and max assist to lay back down in bed.  When therapist was assisting pt back to bed, pt suddenly grabbed her foley catheter between her legs attempting to pull it out (therapist held onto foley catheter to prevent it from being pulled out but unable to get pt to let go of foley catheter (pt appearing very agitated trying to pull out foley catheter, yelling intermittently, and attempting to bite her daughter at times as well); therapist asked for assist and nursing came to assist--one nurse then held onto foley catheter (switched with therapist) to prevent it from being pulled out; 3 nurses assisting and pt's daughter attempting to calm pt; pt then given IV Haldol and eventually pt settled enough to let go of foley catheter.  Pt was not wearing any safety mitts when therapist entered pt's room but nursing donned B safety mitts end of session.  Pt would benefit from skilled PT to address noted  impairments and functional limitations (see below for any additional details).  Upon hospital discharge, anticipate pt may benefit from HHPT within familiar setting at home and 24/7 assist for safety.   ? ?Recommendations for follow up therapy are one component of a multi-disciplinary discharge planning process, led by the attending physician.  Recommendations may be updated based on patient status, additional functional criteria and insurance authorization. ? ?Follow Up Recommendations Home health PT ? ?  ?Assistance Recommended at Discharge Frequent or constant Supervision/Assistance  ?Patient can return home with the following ? Two people to help with walking and/or transfers;Two people to help with bathing/dressing/bathroom;Assistance with cooking/housework;Assistance with feeding;Direct supervision/assist for medications management;Direct supervision/assist for financial management;Assist for transportation;Help with stairs or ramp for entrance ? ?  ?Equipment Recommendations Rolling walker (2 wheels);BSC/3in1;Wheelchair (measurements PT);Wheelchair cushion (measurements PT);Hospital bed;Other (comment) (anticipate pt would be too agitated to safely use hoyer lift)  ?Recommendations for Other Services ?    ?  ?Functional Status Assessment Patient has had a recent decline in their functional status and/or demonstrates limited ability to make significant improvements in function in a reasonable and predictable amount of time  ? ?  ?Precautions / Restrictions Precautions ?Precautions: Fall ?Restrictions ?Weight Bearing Restrictions: Yes ?LLE Weight Bearing: Weight bearing as tolerated  ? ?  ? ?Mobility ? Bed Mobility ?Overal bed mobility: Needs Assistance ?Bed Mobility: Supine to Sit, Sit to Supine ?  ?  ?Supine to sit: Mod assist, Max assist, HOB elevated ?Sit to supine: Max assist, HOB elevated ?  ?General bed mobility  comments: assist for trunk and B LE's; vc's for technique ?  ? ?Transfers ?  ?  ?  ?  ?  ?  ?   ?  ?  ?General transfer comment: not appropriate at this time ?  ? ?Ambulation/Gait ?  ?  ?  ?  ?  ?  ?  ?  ? ?Stairs ?  ?  ?  ?  ?  ? ?Wheelchair Mobility ?  ? ?Modified Rankin (Stroke Patients Only) ?  ? ?  ? ?Balance Overall balance assessment: Needs assistance ?Sitting-balance support: Bilateral upper extremity supported, Feet supported ?Sitting balance-Leahy Scale: Fair ?Sitting balance - Comments: initially mod assist for sitting balance (pt leaning towards R d/t L hip pain) but improved to close SBA ?  ?  ?  ?  ?  ?  ?  ?  ?  ?  ?  ?  ?  ?  ?  ?   ? ? ? ?Pertinent Vitals/Pain Pain Assessment ?Pain Assessment: Faces ?Faces Pain Scale: Hurts a little bit ?Pain Location: L hip ?Pain Descriptors / Indicators: Guarding ?Pain Intervention(s): Limited activity within patient's tolerance, Monitored during session, Premedicated before session, Repositioned  ? ? ?Home Living Family/patient expects to be discharged to:: Private residence ?Living Arrangements: Children ?Available Help at Discharge: Family;Available 24 hours/day ?Type of Home: House ?Home Access: Stairs to enter ?Entrance Stairs-Rails: Right ?Entrance Stairs-Number of Steps: 3 (into den) ?  ?Home Layout: One level ?Home Equipment: Rollator (4 wheels);BSC/3in1 ?Additional Comments: Pt's daughters take turns staying with pt for 2 days each (provide 24/7 assist)  ?  ?Prior Function Prior Level of Function : Needs assist ? Cognitive Assist : Mobility (cognitive);ADLs (cognitive) ?  ?  ?  ?  ?  ?Mobility Comments: Pt will walk with walker or no AD; frequent falls ?ADLs Comments: Daughter's assist with ADL's ?  ? ? ?Hand Dominance  ?   ? ?  ?Extremity/Trunk Assessment  ? Upper Extremity Assessment ?Upper Extremity Assessment: Difficult to assess due to impaired cognition (pt did not follow cues to assess UE strength although B UE's appearing strong during sessions activities (including grip strength)) ?  ? ?Lower Extremity Assessment ?Lower Extremity  Assessment: Difficult to assess due to impaired cognition (pt did not follow cues to assess LE strength) ?  ? ?Cervical / Trunk Assessment ?Cervical / Trunk Assessment: Normal  ?Communication  ?    ?Cognition Arousal/Alertness: Awake/alert ?Behavior During Therapy: Agitated, Impulsive ?Overall Cognitive Status: History of cognitive impairments - at baseline ?  ?  ?  ?  ?  ?  ?  ?  ?  ?  ?  ?  ?  ?  ?  ?  ?General Comments: Oriented to at least name ?  ?  ? ?  ?General Comments General comments (skin integrity, edema, etc.): no drainage noted honeycomb dressings L hip/thigh.  Nursing cleared pt for participation in physical therapy.  Pt's daughter agreeable to PT session. ? ?  ?Exercises    ? ?Assessment/Plan  ?  ?PT Assessment Patient needs continued PT services  ?PT Problem List Decreased strength;Decreased activity tolerance;Decreased balance;Decreased mobility;Decreased knowledge of use of DME;Pain ? ?   ?  ?PT Treatment Interventions DME instruction;Gait training;Stair training;Functional mobility training;Therapeutic activities;Therapeutic exercise;Balance training;Patient/family education   ? ?PT Goals (Current goals can be found in the Care Plan section)  ?Acute Rehab PT Goals ?Patient Stated Goal: to go home ?PT Goal Formulation: With family ?Time For Goal Achievement: 08/10/21 ?Potential  to Achieve Goals: Good ? ?  ?Frequency 7X/week ?  ? ? ?Co-evaluation   ?  ?  ?  ?  ? ? ?  ?AM-PAC PT "6 Clicks" Mobility  ?Outcome Measure Help needed turning from your back to your side while in a flat bed without using bedrails?: A Lot ?Help needed moving from lying on your back to sitting on the side of a flat bed without using bedrails?: A Lot ?Help needed moving to and from a bed to a chair (including a wheelchair)?: Total ?Help needed standing up from a chair using your arms (e.g., wheelchair or bedside chair)?: Total ?Help needed to walk in hospital room?: Total ?Help needed climbing 3-5 steps with a railing? :  Total ?6 Click Score: 8 ? ?  ?End of Session   ?Activity Tolerance: Treatment limited secondary to agitation ?Patient left: in bed;with call bell/phone within reach;with bed alarm set;with nursing/sitter in room;with

## 2021-07-27 NOTE — Plan of Care (Signed)

## 2021-07-27 NOTE — TOC Progression Note (Signed)
Transition of Care (TOC) - Progression Note  ? ? ?Patient Details  ?Name: [name] ?MRN: [id number] ?Date of Birth: 1927-03-01 ? ?Transition of Care (TOC) CM/SW Contact  ?Marlowe Sax, RN ?Phone Number: ?07/27/2021, 2:54 PM ? ?Clinical Narrative:   Confirmed with Adpt that the Hospital bed will be delivered tomorrow for the patient to DC home ? ? ? ?  ?  ? ?Expected Discharge Plan and Services ?  ?  ?  ?  ?  ?                ?  ?  ?  ?  ?  ?  ?  ?  ?  ?  ? ? ?Social Determinants of Health (SDOH) Interventions ?  ? ?Readmission Risk Interventions ?Readmission Risk Prevention Plan 08/05/2020 08/04/2020  ?Transportation Screening Complete Complete  ?PCP or Specialist Appt within 3-5 Days Complete Complete  ?HRI or Home Care Consult Complete Complete  ?Social Work Consult for Recovery Care Planning/Counseling Complete Complete  ?Palliative Care Screening Not Applicable Not Applicable  ?Medication Review Oceanographer) Complete Complete  ?Some recent data might be hidden  ? ? ?

## 2021-07-27 NOTE — Discharge Instructions (Signed)

## 2021-07-27 NOTE — Plan of Care (Signed)

## 2021-07-27 NOTE — Progress Notes (Signed)
[facility]  -  at [facility] ? ? ?PATIENT NAME: Kristen Osborn   ? ?MR#:  [id number] ? ?DATE OF BIRTH:  June 11, 1926 ? ?SUBJECTIVE:  ? ?patient confused at baseline due to advanced dementia. Daughter at bedside. Has been intermittently refusing PO meds and food. ?Appears calm at present. ?She was agitated earlier with physical therapy. ? ? ?VITALS:  ?Blood pressure 137/69, pulse 65, temperature 97.6 ?F (36.4 ?C), resp. rate 16, height 5\' 3"  (1.6 m), weight 45.4 kg, SpO2 91 %. ? ?PHYSICAL EXAMINATION:  ? ?GENERAL:  88 y.o.-year-old patient lying in the bed with no acute distress.  ?LUNGS: Normal breath sounds bilaterally, no wheezing, rales, rhonchi.  ?CARDIOVASCULAR: S1, S2 normal. No murmurs, rubs, or gallops.  ?ABDOMEN: Soft, nontender, nondistended. Bowel sounds present.  ?EXTREMITIES: No  edema b/l.    ?NEUROLOGIC: nonfocal  patient is alert and awake ?SKIN: No obvious rash, lesion, or ulcer.  ? ?LABORATORY PANEL:  ?CBC ?Recent Labs  ?Lab 07/27/21 ?0309  ?WBC 10.1  ?HGB 9.3*  ?HCT 28.7*  ?PLT 125*  ? ? ?Chemistries  ?Recent Labs  ?Lab 07/27/21 ?0309  ?NA 137  ?K 4.0  ?CL 103  ?CO2 26  ?GLUCOSE 157*  ?BUN 43*  ?CREATININE 1.06*  ?CALCIUM 8.8*  ?MG 2.0  ? ?Cardiac Enzymes ?No results for input(s): TROPONINI in the last 168 hours. ?RADIOLOGY:  ?DG C-Arm 1-60 Min-No Report ? ?Result Date: 07/26/2021 ?Fluoroscopy was utilized by the requesting physician.  No radiographic interpretation.  ? ?DG HIP UNILAT WITH PELVIS 2-3 VIEWS LEFT ? ?Result Date: 07/26/2021 ?CLINICAL DATA:  Fluoroscopic assistance for internal fixation of fracture of left femur EXAM: DG HIP (WITH OR WITHOUT PELVIS) 2-3V LEFT COMPARISON:  07/24/2021 FINDINGS: Fluoroscopic images show interval internal fixation of comminuted fracture of intertrochanteric fracture of proximal left femur. Fluoroscopic time was 73 seconds. IMPRESSION: Fluoroscopic assistance was provided for internal fixation of comminuted intertrochanteric fracture of  left femur with intramedullary rod. Electronically Signed   By: 07/26/2021 M.D.   On: 07/26/2021 15:13   ? ?Assessment and Plan ?Kristen Osborn is a 88 yo female with advanced dementia, chronic afib, CHF, PAD, SSS s/p pacer, HTN, HLD, moderate mitral regurgitation, osteoporosis who presented after a fall at home.  She is cared for by her daughters who rotate staying the night with the patient. ?X-ray on admission showed left intertrochanteric hip fracture. ? ?Closed left hip fracture ?-- status post mechanical fall at home ?-- has advanced dementia and she falls frequently ?-- status post surgery repair of left into trochanteric hip fracture by Dr.Poggi on 07/26/21 ?-- family wants to take patient home at discharge. Awaiting bed delivery at home. ?-- PRN pain meds ?-- discontinue Foley catheter ? ?benign essential hypertension ?-- resume Lopressor ?-- PRN hydralazine ? ?chronic a fib ?-- follows with Dr. 07/28/21 ?-- not on anticoagulation due to high fall risk ?-- continue aspirin per family ? ?advanced dementia ?-- PRN IV Haldol for confusion and delirium ? ? ?Procedures:Reduction and internal fixation of displaced 2 part intertrochanteric left hip fracture with Bi[employer] TFN nail by Dr Gwen Pounds ?Family communication : daughter ?Consults : orthopedic ?CODE STATUS: DNR ?DVT Prophylaxis : enoxaparin ?Level of care: Med-Surg ?Status is: Inpatient ?Remains inpatient appropriate because: patient is postop day one. Physical therapy to see patient. Once bed is delivered hopefully discharge either Wednesday or Thursday. Discussed with daughter ?  ? ?TOTAL TIME TAKING CARE OF THIS PATIENT: 25 minutes.  ?>50% time spent on counselling  and coordination of care ? ?Note: This dictation was prepared with Dragon dictation along with smaller phrase technology. Any transcriptional errors that result from this process are unintentional. ? ?Enedina Finner M.D  ? ? ?[facility]  ? ?CC: ?Primary care physician; Danella Penton,  MD  ?

## 2021-07-27 NOTE — Progress Notes (Signed)
?  Subjective: ?1 Day Post-Op Procedure(s) (LRB): ?INTRAMEDULLARY (IM) NAIL INTERTROCHANTRIC (Left) ?Patient reports pain as mild.  Patient has confusion.  Family in the room. ?Patient is well, and has had no acute complaints or problems ?Plan is to go Skilled nursing facility after hospital stay. ?Negative for chest pain and shortness of breath ?Fever: no ?Gastrointestinal: Negative for nausea and vomiting ? ?Objective: ?Vital signs in last 24 hours: ?Temp:  [97.4 ?F (36.3 ?C)-98.5 ?F (36.9 ?C)] 97.8 ?F (36.6 ?C) (03/21 0316) ?Pulse Rate:  [58-107] 85 (03/21 0316) ?Resp:  [14-26] 14 (03/20 1622) ?BP: (126-182)/(64-112) 140/72 (03/21 0316) ?SpO2:  [86 %-100 %] 94 % (03/21 0316) ? ?Intake/Output from previous day: ? ?Intake/Output Summary (Last 24 hours) at 07/27/2021 0706 ?Last data filed at 07/27/2021 0705 ?Gross per 24 hour  ?Intake 1456.25 ml  ?Output 1270 ml  ?Net 186.25 ml  ?  ?Intake/Output this shift: ?Total I/O ?In: -  ?Out: 350 [Urine:350] ? ?Labs: ?Recent Labs  ?  07/24/21 ?1938 07/25/21 ?0145 07/26/21 ?3474 07/27/21 ?0309  ?HGB 12.2 11.6* 11.2* 9.3*  ? ?Recent Labs  ?  07/26/21 ?2595 07/27/21 ?0309  ?WBC 9.5 10.1  ?RBC 3.93 3.23*  ?HCT 34.8* 28.7*  ?PLT 137* 125*  ? ?Recent Labs  ?  07/26/21 ?6387 07/27/21 ?0309  ?NA 135 137  ?K 4.2 4.0  ?CL 100 103  ?CO2 24 26  ?BUN 32* 43*  ?CREATININE 0.97 1.06*  ?GLUCOSE 126* 157*  ?CALCIUM 9.1 8.8*  ? ?Recent Labs  ?  07/25/21 ?0032 07/26/21 ?1050  ?INR 2.2* 1.2  ? ? ? ?EXAM ?General - Patient is Confused ?Extremity - Sensation intact distally ?Dorsiflexion/Plantar flexion intact ?Compartment soft ?Dressing/Incision - clean, dry, no drainage ?Motor Function - intact, moving foot and toes well on exam.  ? ?Past Medical History:  ?Diagnosis Date  ? Atrial fibrillation (HCC)   ? CHF (congestive heart failure) (HCC)   ? Dementia (HCC)   ? ? ?Assessment/Plan: ?1 Day Post-Op Procedure(s) (LRB): ?INTRAMEDULLARY (IM) NAIL INTERTROCHANTRIC (Left) ?Principal Problem: ?  Closed  left hip fracture (HCC) ?Active Problems: ?  Chronic a-fib (HCC) ?  Benign essential HTN ?  Frequent falls ? ?Estimated body mass index is 17.73 kg/m? as calculated from the following: ?  Height as of this encounter: 5\' 3"  (1.6 m). ?  Weight as of this encounter: 45.4 kg. ?Advance diet ?Up with therapy ? ?Care management for discharge planning. ? ?DVT Prophylaxis - Lovenox, Foot Pumps, and TED hose ?Weight-Bearing as tolerated to left leg ? ? , PA-C ?Orthopaedic Surgery ?07/27/2021, 7:06 AM ? ?

## 2021-07-28 LAB — GLUCOSE, CAPILLARY
Glucose-Capillary: 117 mg/dL — ABNORMAL HIGH (ref 70–99)
Glucose-Capillary: 164 mg/dL — ABNORMAL HIGH (ref 70–99)
Glucose-Capillary: 117 mg/dL — ABNORMAL HIGH (ref 70–99)
Glucose-Capillary: 140 mg/dL — ABNORMAL HIGH (ref 70–99)

## 2021-07-28 NOTE — Care Management Important Message (Signed)
Important Message ? ?Patient Details  ?Name: Kristen Osborn ?MRN: [id number] ?Date of Birth: 1927-04-28 ? ? ?Medicare Important Message Given:  Yes ? ?I reviewed the Important Message from Medicare with the patient's daughter, Ardeth Sportsman [phone] and she in agreement with the discharge plan. As I was asking if she would like a copy our call got disconnected.  I called again and left a voice message for her to return my call at her convenience. Will await her reply to find out if she would like a copy.  ? ? ?Juliann Pulse A Kieran Arreguin ?07/28/2021, 1:28 PM ?

## 2021-07-28 NOTE — Progress Notes (Signed)
[place] [facility] ? ? ?PATIENT NAME: Kristen Osborn   ? ?MR#:  [id number] ? ?DATE OF BIRTH:  11-22-1926 ? ?SUBJECTIVE:  ? ?patient confused at baseline due to advanced dementia. Daughter at bedside. ?Appears calm at present. ?Did work with RN and physical therapy to get to bedside commode. ? ? ?VITALS:  ?Blood pressure 115/63, pulse 88, temperature 98.2 ?F (36.8 ?C), resp. rate 16, height 5\' 3"  (1.6 m), weight 45.4 kg, SpO2 94 %. ? ?PHYSICAL EXAMINATION:  ? ?GENERAL:  88 y.o.-year-old patient lying in the bed with no acute distress.  ?LUNGS: Normal breath sounds bilaterally, no wheezing, rales, rhonchi.  ?CARDIOVASCULAR: S1, S2 normal. No murmurs, rubs, or gallops.  ?ABDOMEN: Soft, nontender, nondistended. Bowel sounds present.  ?EXTREMITIES: No  edema b/l.    ?NEUROLOGIC: nonfocal  patient is alert -- confused at baseline  ? ? ?LABORATORY PANEL:  ?CBC ?Recent Labs  ?Lab 07/27/21 ?0309  ?WBC 10.1  ?HGB 9.3*  ?HCT 28.7*  ?PLT 125*  ? ? ? ?Chemistries  ?Recent Labs  ?Lab 07/27/21 ?0309  ?NA 137  ?K 4.0  ?CL 103  ?CO2 26  ?GLUCOSE 157*  ?BUN 43*  ?CREATININE 1.06*  ?CALCIUM 8.8*  ?MG 2.0  ? ? ?Cardiac Enzymes ?No results for input(s): TROPONINI in the last 168 hours. ?RADIOLOGY:  ?No results found. ? ?Assessment and Plan ?Kristen Osborn is a 88 yo female with advanced dementia, chronic afib, CHF, PAD, SSS s/p pacer, HTN, HLD, moderate mitral regurgitation, osteoporosis who presented after a fall at home.  She is cared for by her daughters who rotate staying the night with the patient. ?X-ray on admission showed left intertrochanteric hip fracture. ? ?Closed left hip fracture ?-- status post mechanical fall at home ?-- has advanced dementia and she falls frequently ?-- status post surgery repair of left into trochanteric hip fracture by Dr.Poggi on 07/26/21 ?-- family wants to take patient home at discharge. ?-- PRN pain meds ?-- discontinue Foley catheter ? ?benign essential hypertension ?--  resume Lopressor ?-- PRN hydralazine ? ?chronic a fib ?-- follows with Dr. Nehemiah Massed ?-- not on anticoagulation due to high fall risk ?-- continue aspirin per family ? ?advanced dementia ?-- PRN IV Haldol for confusion and delirium ? ? ?Procedures:Reduction and internal fixation of displaced 2 part intertrochanteric left hip fracture with [employer] TFN nail by Dr Roland Rack ?Family communication : daughter ?Consults : orthopedic ?CODE STATUS: DNR ?DVT Prophylaxis : enoxaparin ?Level of care: Med-Surg ?Status is: Inpatient ?Remains inpatient appropriate because: patient is postop day 2  ?patient work with therapy today. Will do one more therapy session tomorrow before family takes her home. Both her daughters are in agreement. ?  ? ?TOTAL TIME TAKING CARE OF THIS PATIENT: 25 minutes.  ?>50% time spent on counselling and coordination of care ? ?Note: This dictation was prepared with Dragon dictation along with smaller phrase technology. Any transcriptional errors that result from this process are unintentional. ? ?Fritzi Mandes M.D  ? ? ?[facility]  ? ?CC: ?Primary care physician; Rusty Aus, MD  ?

## 2021-07-28 NOTE — Progress Notes (Signed)
Physical Therapy Treatment ?Patient Details ?Name: Kristen Osborn ?MRN: [id number] ?DOB: Nov 30, 1926 ?Today's Date: 07/28/2021 ? ? ?History of Present Illness Pt is a 88 y.o. female presenting to hospital 3/18 s/p mechanical fall c/o L hip pain.  Pt admitted with frequent falls, closed L hip fx, a-fib and flutter.  S/p ORIF of displaced 2 part intertrochanteric L hip fx 07/26/21. PMH includes dementia, a-fib on Eliquis, CHF s/p pacemaker, PAD, htn, R wrist fx 06/28/20, R hip arthroplasty 06/28/20, ORIF periprosthetic fx R 08/03/20. ? ?  ?PT Comments  ? ? Patient resting in bed, daughter at bedside upon arrival to room.  Patient generally restless, constantly grabbing at linen, clothing; max cuing and redirection to release grasp. ?Treatment emphasis on bed/chair transfers this date, performing with max/total assist +2 for sit/stand, standing balance and stand/pivot transfer from bed/chair.  Requires extensive physical assist, very poor/absent balance; unsafe to attempt without +2 at all times. ?Daughters (x2) at bedside throughout session; present to observe performance and level of assist required.  Continue to voice preference for transition home.  Have RW, BSC, WC and cushion, hospital bed (now in place); declined hoyer lift. ?Plan for one additional session in AM prior to discharge to optimize transfer safety.  Family, MD aware. ? ?  ?Recommendations for follow up therapy are one component of a multi-disciplinary discharge planning process, led by the attending physician.  Recommendations may be updated based on patient status, additional functional criteria and insurance authorization. ? ?Follow Up Recommendations ? Home health PT ?  ?  ?Assistance Recommended at Discharge Frequent or constant Supervision/Assistance  ?Patient can return home with the following Two people to help with walking and/or transfers;Two people to help with bathing/dressing/bathroom;Assistance with cooking/housework;Assistance with  feeding;Direct supervision/assist for medications management;Direct supervision/assist for financial management;Assist for transportation;Help with stairs or ramp for entrance ?  ?Equipment Recommendations ? Rolling walker (2 wheels);BSC/3in1;Wheelchair (measurements PT);Wheelchair cushion (measurements PT);Hospital bed;Other (comment) (hoyer lift)  ?  ?Recommendations for Other Services   ? ? ?  ?Precautions / Restrictions Precautions ?Precautions: Fall ?Restrictions ?Weight Bearing Restrictions: Yes ?LLE Weight Bearing: Weight bearing as tolerated  ?  ? ?Mobility ? Bed Mobility ?Overal bed mobility: Needs Assistance ?Bed Mobility: Supine to Sit ?  ?  ?Supine to sit: Mod assist, Max assist ?  ?  ?General bed mobility comments: extensive assist for LEs and truncal elevation ?  ? ?Transfers ?Overall transfer level: Needs assistance ?Equipment used: 2 person hand held assist ?Transfers: Sit to/from Stand, Bed to chair/wheelchair/BSC ?Sit to Stand: Max assist, +2 physical assistance ?Stand pivot transfers: Max assist, +2 physical assistance ?  ?  ?  ?  ?General transfer comment: extensive assist with bilat HHA/arm-in-arm for lift off, anterior weight translation and standing balance.  Significant difficulty with LE foot placement (tends to maintain in front of knees).  Unable to unweight/advance LEs; transfer performed as dep pivot on stationary feet ?  ? ?Ambulation/Gait ?  ?  ?  ?  ?  ?  ?  ?General Gait Details: unsafe/unable ? ? ?Stairs ?  ?  ?  ?  ?  ? ? ?Wheelchair Mobility ?  ? ?Modified Rankin (Stroke Patients Only) ?  ? ? ?  ?Balance Overall balance assessment: Needs assistance ?Sitting-balance support: No upper extremity supported, Feet supported ?Sitting balance-Leahy Scale: Fair ?Sitting balance - Comments: R lateral lean to unweight/offset L hip WBing ?  ?Standing balance support: Bilateral upper extremity supported ?Standing balance-Leahy Scale: Zero ?  ?  ?  ?  ?  ?  ?  ?  ?  ?  ?  ?  ?  ? ?  ?  Cognition  Arousal/Alertness: Awake/alert ?Behavior During Therapy: Anxious ?Overall Cognitive Status: History of cognitive impairments - at baseline ?  ?  ?  ?  ?  ?  ?  ?  ?  ?  ?  ?  ?  ?  ?  ?  ?General Comments: Oriented to self only; hand-over-hand to initiate and process tasks.  Very restless and fidgety ?  ?  ? ?  ?Exercises Other Exercises ?Other Exercises: Sit/stand x3 with bilat HHA/arm-in-arm, max/total assist +2.  Continuous assist for LE foot placement, lift off, standing balance.  Max manual facilitation at bilat hips and trunk for postural extension and anterior weight translation  Poor balace; high fall risk. Unable to unweight/advance LEs. ? ?  ?General Comments   ?  ?  ? ?Pertinent Vitals/Pain Pain Assessment ?Pain Assessment: Faces ?Faces Pain Scale: Hurts a little bit ?Pain Location: L hip ?Pain Descriptors / Indicators: Guarding, Grimacing ?Pain Intervention(s): Limited activity within patient's tolerance, Monitored during session, Repositioned  ? ? ?Home Living   ?  ?  ?  ?  ?  ?  ?  ?  ?  ?   ?  ?Prior Function    ?  ?  ?   ? ?PT Goals (current goals can now be found in the care plan section) Acute Rehab PT Goals ?Patient Stated Goal: to go home ?PT Goal Formulation: With family ?Time For Goal Achievement: 08/10/21 ?Potential to Achieve Goals: Fair ?Progress towards PT goals: Progressing toward goals ? ?  ?Frequency ? ? ? 7X/week ? ? ? ?  ?PT Plan Current plan remains appropriate  ? ? ?Co-evaluation   ?  ?  ?  ?  ? ?  ?AM-PAC PT "6 Clicks" Mobility   ?Outcome Measure ? Help needed turning from your back to your side while in a flat bed without using bedrails?: A Lot ?Help needed moving from lying on your back to sitting on the side of a flat bed without using bedrails?: A Lot ?Help needed moving to and from a bed to a chair (including a wheelchair)?: Total ?Help needed standing up from a chair using your arms (e.g., wheelchair or bedside chair)?: Total ?Help needed to walk in hospital room?: Total ?Help  needed climbing 3-5 steps with a railing? : Total ?6 Click Score: 8 ? ?  ?End of Session Equipment Utilized During Treatment: Gait belt ?Activity Tolerance: Patient tolerated treatment well ?Patient left: in chair;with call bell/phone within reach;with chair alarm set;with family/visitor present ?Nurse Communication: Mobility status;Precautions;Weight bearing status;Other (comment) ?PT Visit Diagnosis: Other abnormalities of gait and mobility (R26.89);Muscle weakness (generalized) (M62.81);History of falling (Z91.81);Difficulty in walking, not elsewhere classified (R26.2);Pain ?Pain - Right/Left: Left ?Pain - part of body: Hip ?  ? ? ?Time: 2595-6387 ?PT Time Calculation (min) (ACUTE ONLY): 25 min ? ?Charges:  $Therapeutic Activity: 23-37 mins          ?          ?Kristen Osborn, PT, DPT, NCS ?07/28/21, 8:32 PM ?[id number] ? ? ?

## 2021-07-28 NOTE — Progress Notes (Signed)
?  Subjective: ?2 Days Post-Op Procedure(s) (LRB): ?INTRAMEDULLARY (IM) NAIL INTERTROCHANTRIC (Left) ?Patient reports pain as mild.  Patient has confusion.  Patient was up most of the night.  Family in the room. ?Patient is well, and has had no acute complaints or problems ?Plan is to go Skilled nursing facility after hospital stay. ?Negative for chest pain and shortness of breath ?Fever: no ?Gastrointestinal: Negative for nausea and vomiting ? ?Objective: ?Vital signs in last 24 hours: ?Temp:  [97.6 ?F (36.4 ?C)-97.9 ?F (36.6 ?C)] 97.8 ?F (36.6 ?C) (03/21 2022) ?Pulse Rate:  [65-92] 92 (03/21 2037) ?Resp:  [16-18] 18 (03/21 2022) ?BP: (137-149)/(58-96) 146/96 (03/21 2037) ?SpO2:  [91 %-93 %] 91 % (03/21 2022) ? ?Intake/Output from previous day: ? ?Intake/Output Summary (Last 24 hours) at 07/28/2021 0649 ?Last data filed at 07/27/2021 1900 ?Gross per 24 hour  ?Intake 540 ml  ?Output 350 ml  ?Net 190 ml  ?  ?Intake/Output this shift: ?No intake/output data recorded. ? ?Labs: ?Recent Labs  ?  07/26/21 ?[id number] 07/27/21 ?0309  ?HGB 11.2* 9.3*  ? ?Recent Labs  ?  07/26/21 ?[id number] 07/27/21 ?0309  ?WBC 9.5 10.1  ?RBC 3.93 3.23*  ?HCT 34.8* 28.7*  ?PLT 137* 125*  ? ?Recent Labs  ?  07/26/21 ?[id number] 07/27/21 ?0309  ?NA 135 137  ?K 4.2 4.0  ?CL 100 103  ?CO2 24 26  ?BUN 32* 43*  ?CREATININE 0.97 1.06*  ?GLUCOSE 126* 157*  ?CALCIUM 9.1 8.8*  ? ?Recent Labs  ?  07/26/21 ?1050  ?INR 1.2  ? ? ? ?EXAM ?General - Patient is Confused ?Extremity - Sensation intact distally ?Dorsiflexion/Plantar flexion intact ?Compartment soft ?Dressing/Incision - clean, dry, no drainage ?Motor Function - intact, moving foot and toes well on exam.  ? ?Past Medical History:  ?Diagnosis Date  ? Atrial fibrillation ([place])   ? CHF (congestive heart failure) ([place])   ? Dementia ([place])   ? ? ?Assessment/Plan: ?2 Days Post-Op Procedure(s) (LRB): ?INTRAMEDULLARY (IM) NAIL INTERTROCHANTRIC (Left) ?Principal Problem: ?  Closed left hip fracture ([place]) ?Active Problems: ?   Chronic a-fib ([place]) ?  Benign essential HTN ?  Frequent falls ? ?Estimated body mass index is 17.73 kg/m? as calculated from the following: ?  Height as of this encounter: 5\' 3"  (1.6 m). ?  Weight as of this encounter: 45.4 kg. ?Advance diet ?Up with therapy ? ?Care management for discharge planning.  Plan to take the patient home with home health physical therapy and nursing. ? ?Follow-up at [facility] in 2 weeks for staple removal ? ?DVT Prophylaxis - Lovenox, Foot Pumps, and TED hose ?Weight-Bearing as tolerated to left leg ? ?Reche Dixon, PA-C ?Orthopaedic Surgery ?07/28/2021, 6:49 AM ? ?

## 2021-07-29 LAB — GLUCOSE, CAPILLARY: Glucose-Capillary: 106 mg/dL — ABNORMAL HIGH (ref 70–99)

## 2021-07-29 MED ORDER — POLYETHYLENE GLYCOL 3350 17 G PO PACK: 17.0000 g | PACK | Freq: Every day | ORAL | 0 refills | Status: DC | PRN

## 2021-07-29 MED ORDER — OLANZAPINE 5 MG PO TABS
2.5000 mg | ORAL_TABLET | Freq: Once | ORAL | Status: AC
Start: 2021-07-29 — End: 2021-07-29
  Administered 2021-07-29: 2.5 mg via ORAL
  Filled 2021-07-29: qty 1

## 2021-07-29 NOTE — Progress Notes (Signed)
1121 ?All lines removed. D/c instructions reviewed with Vick Frees (daughter).  ?

## 2021-07-29 NOTE — Progress Notes (Signed)
Physical Therapy Treatment ?Patient Details ?Name: [name] ?MRN: [id number] ?DOB: 19-Oct-1926 ?Today's Date: 07/29/2021 ? ? ?History of Present Illness Pt is a 88 y.o. female presenting to hospital 3/18 s/p mechanical fall c/o L hip pain.  Pt admitted with frequent falls, closed L hip fx, a-fib and flutter.  S/p ORIF of displaced 2 part intertrochanteric L hip fx 07/26/21. PMH includes dementia, a-fib on Eliquis, CHF s/p pacemaker, PAD, htn, R wrist fx 06/28/20, R hip arthroplasty 06/28/20, ORIF periprosthetic fx R 08/03/20. ? ?  ?PT Comments  ? ? Pt was long sitting in bed upon arriving with supportive daughter/caregiver at bedside. Pt continues to only be oriented to self but was able to more consistently follow simple commands. Pt is less anxious today than previous date. Was agreeable to PT session and OOB activity. She required max assist to exit bed. Stood to [employer] and ambulated to doorway of room. Pt tolerated wt on BLEs however struggles with RLE advancement at times. Chief Strategy Officer issued family a gait belt for home use and recommended family perform stand pivot transfers until gait safety improves. Overall pt tolerated session well. Family reports having all equipment needs met and are prepared for DC home today via EMS. HHPT most appropriate at DC to assist pt to PLOF.  ?   ?Recommendations for follow up therapy are one component of a multi-disciplinary discharge planning process, led by the attending physician.  Recommendations may be updated based on patient status, additional functional criteria and insurance authorization. ? ?Follow Up Recommendations ? Home health PT ?  ?  ?Assistance Recommended at Discharge Frequent or constant Supervision/Assistance  ?Patient can return home with the following Two people to help with walking and/or transfers;Two people to help with bathing/dressing/bathroom;Assistance with cooking/housework;Assistance with feeding;Direct supervision/assist for medications management;Direct  supervision/assist for financial management;Assist for transportation;Help with stairs or ramp for entrance ?  ?Equipment Recommendations ? None recommended by PT  ?  ?   ?Precautions / Restrictions Precautions ?Precautions: Fall ?Restrictions ?Weight Bearing Restrictions: Yes ?LLE Weight Bearing: Weight bearing as tolerated  ?  ? ?Mobility ? Bed Mobility ?Overal bed mobility: Needs Assistance ?Bed Mobility: Supine to Sit ?  ?  ?Supine to sit: Mod assist, Max assist ?Sit to supine: Max assist, HOB elevated ?  ?  ?  ? ?Transfers ?Overall transfer level: Needs assistance ?Equipment used: Rolling walker (2 wheels) ?Transfers: Sit to/from Stand ?Sit to Stand: Min assist, Mod assist, From elevated surface ?  ?  ?  ?  ?  ?General transfer comment: pt demonstrated much imporved abilities today. Author feels mostly due to being less anxious overall. ?  ? ?Ambulation/Gait ?Ambulation/Gait assistance: Mod assist ?Gait Distance (Feet): 25 Feet ?Assistive device: Rolling walker (2 wheels) ?Gait Pattern/deviations: Step-through pattern, Narrow base of support, Scissoring ?Gait velocity: decreased ?  ?  ?General Gait Details: pt was able to ambulate to doorway of room and return. Does required inconsistent amount of assistance throughout. ? ? ?  ?Balance Overall balance assessment: Needs assistance ?Sitting-balance support: No upper extremity supported, Feet supported ?Sitting balance-Leahy Scale: Fair ?  ?  ?Standing balance support: Bilateral upper extremity supported, During functional activity, Reliant on assistive device for balance ?Standing balance-Leahy Scale: Poor ?  ?  ?  ?Cognition Arousal/Alertness: Awake/alert ?Behavior During Therapy: [facility] for tasks assessed/performed ?Overall Cognitive Status: History of cognitive impairments - at baseline ?  ?  ?   ?General Comments: Pt is oriented to self only. Does better today following simple commands  with overall less fidgeting and less resistance to assistance. ?  ?  ? ?  ?    ?General Comments General comments (skin integrity, edema, etc.): Issue gait belt, chuck pads and wipes for home use. ?  ?  ? ?Pertinent Vitals/Pain Pain Assessment ?Pain Assessment: No/denies pain ?Breathing: normal ?Negative Vocalization: none ?Facial Expression: smiling or inexpressive ?Body Language: tense, distressed pacing, fidgeting ?Consolability: distracted or reassured by voice/touch ?PAINAD Score: 2 ?Pain Location: L hip ?Pain Descriptors / Indicators: Guarding, Grimacing ?Pain Intervention(s): Limited activity within patient's tolerance, Monitored during session, Premedicated before session, Repositioned  ? ? ? ?PT Goals (current goals can now be found in the care plan section) Acute Rehab PT Goals ?Patient Stated Goal: none stated ?Progress towards PT goals: Progressing toward goals ? ?  ?Frequency ? ? ? 7X/week ? ? ? ?  ?PT Plan Current plan remains appropriate  ? ? ?   ?AM-PAC PT "6 Clicks" Mobility   ?Outcome Measure ? Help needed turning from your back to your side while in a flat bed without using bedrails?: A Lot ?Help needed moving from lying on your back to sitting on the side of a flat bed without using bedrails?: A Lot ?Help needed moving to and from a bed to a chair (including a wheelchair)?: A Lot ?Help needed standing up from a chair using your arms (e.g., wheelchair or bedside chair)?: A Lot ?Help needed to walk in hospital room?: A Lot ?Help needed climbing 3-5 steps with a railing? : Total ?6 Click Score: 11 ? ?  ?End of Session Equipment Utilized During Treatment: Gait belt ?Activity Tolerance: Patient tolerated treatment well ?Patient left: in bed;with call bell/phone within reach;with bed alarm set ?Nurse Communication: Mobility status;Precautions;Weight bearing status;Other (comment) ?PT Visit Diagnosis: Other abnormalities of gait and mobility (R26.89);Muscle weakness (generalized) (M62.81);History of falling (Z91.81);Difficulty in walking, not elsewhere classified  (R26.2);Pain ?Pain - Right/Left: Left ?Pain - part of body: Hip ?  ? ? ?Time: 2595-6387 ?PT Time Calculation (min) (ACUTE ONLY): 25 min ? ?Charges:  $Gait Training: 8-22 mins ?$Therapeutic Activity: 8-22 mins          ?          ?Julaine Fusi PTA ?07/29/21, 9:59 AM  ? ?

## 2021-07-29 NOTE — Discharge Summary (Signed)
?Physician Discharge Summary ?  ?Patient: Kristen Osborn MRN: [id number] DOB: 05-18-1926  ?Admit date:     07/24/2021  ?Discharge date: 07/29/21  ?Discharge Physician: Enedina FinnerSona Haider Hornaday  ? ?PCP: Danella PentonMiller, Mark F, MD  ? ?Recommendations at discharge:  ? ?follow-up orthopedic in 1 to 2 week ?follow-up primary care physician in 1 to 2 weeks ? ?Discharge Diagnoses: ? ?closed left hip fracture status post surgery ? ?Hospital Course: ? ?Kristen Osborn is a 88 yo female with advanced dementia, chronic afib, CHF, PAD, SSS s/p pacer, HTN, HLD, moderate mitral regurgitation, osteoporosis who presented after a fall at home.  She is cared for by her daughters who rotate staying the night with the patient. ?X-ray on admission showed left intertrochanteric hip fracture. ?  ?Closed left hip fracture ?-- status post mechanical fall at home ?-- has advanced dementia and she falls frequently ?-- status post surgery repair of left into trochanteric hip fracture by Dr.Poggi on 07/26/21 ?-- family wants to take patient home at discharge. ?-- PRN pain meds ?--DVT prophylaxis with lovenox per ortho ?  ?benign essential hypertension ?-- resume Lopressor ?-- PRN hydralazine ?  ?chronic a fib ?-- follows with Dr. Gwen PoundsKowalski ?-- not on anticoagulation due to high fall risk ?-- continue aspirin per family ?-cont BB ?  ?advanced dementia ?-- PRN IV Haldol for confusion and delirium ?  ?patient overall doing well. She is at baseline. She will discharge to home today. ?Discharge plan discussed with daughter at bedside. Questions answered ? ?  ? ? ?Consultants: Dr Leota SauersPoggi--ortho ?Procedures performed: left hip surgery  ?Disposition: Home health ?Diet recommendation:  ?Discharge Diet Orders (From admission, onward)  ? ?  Start     Ordered  ? 07/29/21 0000  Diet - low sodium heart healthy       ? 07/29/21 0849  ? ?  ?  ? ?  ? ?Regular diet ?DISCHARGE MEDICATION: ?Allergies as of 07/29/2021   ? ?   Reactions  ? Lisinopril Other (See Comments)  ? Family is unaware of this  allergy   ? ?  ? ?  ?Medication List  ?  ? ?TAKE these medications   ? ?aspirin 81 MG EC tablet ?Take by mouth. ?  ?divalproex 125 MG DR tablet ?Commonly known as: DEPAKOTE ?Take 125 mg by mouth in the morning, at noon, and at bedtime. ?  ?enoxaparin 40 MG/0.4ML injection ?Commonly known as: LOVENOX ?Inject 0.4 mLs (40 mg total) into the skin daily for 14 days. ?  ?ferrous sulfate 325 (65 FE) MG tablet ?Take 1 tablet (325 mg total) by mouth daily with breakfast. ?What changed: when to take this ?  ?HYDROcodone-acetaminophen 5-325 MG tablet ?Commonly known as: NORCO/VICODIN ?Take 1-2 tablets by mouth every 6 (six) hours as needed for moderate pain. ?  ?metoprolol tartrate 25 MG tablet ?Commonly known as: LOPRESSOR ?Take 25 mg by mouth daily. ?  ?omeprazole 20 MG capsule ?Commonly known as: PRILOSEC ?Take 20 mg by mouth daily. ?  ?polyethylene glycol 17 g packet ?Commonly known as: MIRALAX / GLYCOLAX ?Take 17 g by mouth daily as needed for mild constipation. ?  ?potassium chloride 8 MEQ tablet ?Commonly known as: KLOR-CON ?Take 8 mEq by mouth daily. ?  ?vitamin B-12 1000 MCG tablet ?Commonly known as: CYANOCOBALAMIN ?Take 1,000 mcg by mouth daily. ?  ? ?  ? ?  ?  ? ? ?  ?Durable Medical Equipment  ?(From admission, onward)  ?  ? ? ?  ? ?  Start     Ordered  ? 07/26/21 1522  For home use only DME Hospital bed  Once       ?Question Answer Comment  ?Length of Need Lifetime   ?Patient has (list medical condition): hip fracture, Dementia   ?The above medical condition requires: Patient requires the ability to reposition frequently   ?Bed type Semi-electric   ?Support Surface: Gel Overlay   ?  ? 07/26/21 1521  ? ?  ?  ? ?  ? ? ?  ?Discharge Care Instructions  ?(From admission, onward)  ?  ? ? ?  ? ?  Start     Ordered  ? 07/29/21 0000  Discharge wound care:       ?Comments: Reinforce dressing  ? 07/29/21 0849  ? ?  ?  ? ?  ? ? Follow-up Information   ? ? Anson [place], PA-C Follow up in 2 week(s).   ?Specialty:  Physician Assistant ?Why: For staple removal ?Contact information: ?[address] ?[facility] ?[place] [address] ?[phone] ? ? ?  ?  ? ? Danella Penton, MD Follow up.   ?Specialty: Internal Medicine ?Why: hosp f/u ?Contact information: ?[address] ?[facility] ?[place] [address] ?[phone] ? ? ?  ?  ? ?  ?  ? ?  ? ?Discharge Exam: ?Kristen Osborn Weights  ? 07/24/21 1934  ?Weight: 45.4 kg  ? ? ? ?Condition at discharge: fair ? ?The results of significant diagnostics from this hospitalization (including imaging, microbiology, ancillary and laboratory) are listed below for reference.  ? ?Imaging Studies: ?DG C-Arm 1-60 Min-No Report ? ?Result Date: 07/26/2021 ?Fluoroscopy was utilized by the requesting physician.  No radiographic interpretation.  ? ?DG HIP UNILAT WITH PELVIS 2-3 VIEWS LEFT ? ?Result Date: 07/26/2021 ?CLINICAL DATA:  Fluoroscopic assistance for internal fixation of fracture of left femur EXAM: DG HIP (WITH OR WITHOUT PELVIS) 2-3V LEFT COMPARISON:  07/24/2021 FINDINGS: Fluoroscopic images show interval internal fixation of comminuted fracture of intertrochanteric fracture of proximal left femur. Fluoroscopic time was 73 seconds. IMPRESSION: Fluoroscopic assistance was provided for internal fixation of comminuted intertrochanteric fracture of left femur with intramedullary rod. Electronically Signed   By: Ernie Avena M.D.   On: 07/26/2021 15:13  ? ?DG Hip Unilat W or Wo Pelvis 2-3 Views Left ? ?Result Date: 07/24/2021 ?CLINICAL DATA:  Fall, left hip pain EXAM: DG HIP (WITH OR WITHOUT PELVIS) 2-3V LEFT COMPARISON:  None. FINDINGS: Intertrochanteric left hip fracture, minimally displaced. Right hip arthroplasty with cerclage wire fixation. Visualized bony pelvis appears intact. Vascular calcifications. IMPRESSION: Intertrochanteric left hip fracture, as above. Electronically Signed   By: Charline Bills M.D.   On: 07/24/2021 22:03    ? ?Microbiology: ?Results for orders placed or performed during the hospital encounter of 10/24/20  ?Resp Panel by RT-PCR (Flu A&B, Covid) Nasopharyngeal Swab     Status: None  ? Collection Time: 10/24/20 10:31 PM  ? Specimen: Nasopharyngeal Swab; Nasopharyngeal(NP) swabs in vial transport medium  ?Result Value Ref Range Status  ? SARS Coronavirus 2 by RT PCR NEGATIVE NEGATIVE Final  ?  Comment: (NOTE) ?SARS-CoV-2 target nucleic acids are NOT DETECTED. ? ?The SARS-CoV-2 RNA is generally detectable in upper respiratory ?specimens during the acute phase of infection. The lowest ?concentration of SARS-CoV-2 viral copies this assay can detect is ?138 copies/mL. A negative result does not preclude SARS-Cov-2 ?infection and should not be used as the sole basis for treatment or ?other patient management  decisions. A negative result may occur with  ?improper specimen collection/handling, submission of specimen other ?than nasopharyngeal swab, presence of viral mutation(s) within the ?areas targeted by this assay, and inadequate number of viral ?copies(<138 copies/mL). A negative result must be combined with ?clinical observations, patient history, and epidemiological ?information. The expected result is Negative. ? ?Fact Sheet for Patients:  ?BloggerCourse.com ? ?Fact Sheet for Healthcare Providers:  ?SeriousBroker.it ? ?This test is no t yet approved or cleared by the [place] FDA and  ?has been authorized for detection and/or diagnosis of SARS-CoV-2 by ?FDA under an Emergency Use Authorization (EUA). This EUA will remain  ?in effect (meaning this test can be used) for the duration of the ?COVID-19 declaration under Section 564(b)(1) of the Act, 21 ?U.S.C.section 360bbb-3(b)(1), unless the authorization is terminated  ?or revoked sooner.  ? ? ?  ? Influenza A by PCR NEGATIVE NEGATIVE Final  ? Influenza B by PCR NEGATIVE NEGATIVE Final  ?  Comment: (NOTE) ?The Xpert  Xpress SARS-CoV-2/FLU/RSV plus assay is intended as an aid ?in the diagnosis of influenza from Nasopharyngeal swab specimens and ?should not be used as a sole basis for treatment. Nasal washings and ?aspirates are unaccep

## 2021-07-29 NOTE — TOC Progression Note (Signed)
Transition of Care (TOC) - Progression Note  ? ? ?Patient Details  ?Name: [name] ?MRN: [id number] ?Date of Birth: [birth year] ? ?Transition of Care (TOC) CM/SW Contact  ?Marlowe Sax, RN ?Phone Number: ?07/29/2021, 10:20 AM ? ?Clinical Narrative:    ?Spoke with the daughter in the room, they are all prepared for EMS to come and transport the patient home, I called EMS and she has one ahead of her on the list ? ? ?  ?  ? ?Expected Discharge Plan and Services ?  ?  ?  ?  ?  ?Expected Discharge Date: 07/29/21               ?  ?  ?  ?  ?  ?  ?  ?  ?  ?  ? ? ?Social Determinants of Health (SDOH) Interventions ?  ? ?Readmission Risk Interventions ? ?  08/05/2020  ?  2:58 PM 08/04/2020  ?  1:31 PM  ?Readmission Risk Prevention Plan  ?Transportation Screening Complete Complete  ?PCP or Specialist Appt within 3-5 Days Complete Complete  ?HRI or Home Care Consult Complete Complete  ?Social Work Consult for Recovery Care Planning/Counseling Complete Complete  ?Palliative Care Screening Not Applicable Not Applicable  ?Medication Review Oceanographer) Complete Complete  ? ? ?

## 2021-08-13 ENCOUNTER — Other Ambulatory Visit: Payer: Self-pay

## 2021-08-13 ENCOUNTER — Emergency Department: Payer: Medicare Other

## 2021-08-13 ENCOUNTER — Encounter: Payer: Self-pay | Admitting: Emergency Medicine

## 2021-08-13 ENCOUNTER — Inpatient Hospital Stay
Admission: EM | Admit: 2021-08-13 | Discharge: 2021-08-16 | DRG: 481 | Disposition: A | Discharge status: Home Health | Payer: Medicare Other | Attending: Internal Medicine | Admitting: Internal Medicine

## 2021-08-13 DIAGNOSIS — S72402A Unspecified fracture of lower end of left femur, initial encounter for closed fracture: Principal | ICD-10-CM | POA: Diagnosis present

## 2021-08-13 DIAGNOSIS — I739 Peripheral vascular disease, unspecified: Secondary | ICD-10-CM | POA: Diagnosis present

## 2021-08-13 DIAGNOSIS — F0280 Dementia in other diseases classified elsewhere without behavioral disturbance: Secondary | ICD-10-CM | POA: Diagnosis present

## 2021-08-13 DIAGNOSIS — Z79899 Other long term (current) drug therapy: Secondary | ICD-10-CM

## 2021-08-13 DIAGNOSIS — Z888 Allergy status to other drugs, medicaments and biological substances status: Secondary | ICD-10-CM | POA: Diagnosis not present

## 2021-08-13 DIAGNOSIS — I482 Chronic atrial fibrillation, unspecified: Secondary | ICD-10-CM | POA: Diagnosis present

## 2021-08-13 DIAGNOSIS — D649 Anemia, unspecified: Secondary | ICD-10-CM | POA: Diagnosis present

## 2021-08-13 DIAGNOSIS — Z7982 Long term (current) use of aspirin: Secondary | ICD-10-CM

## 2021-08-13 DIAGNOSIS — Z66 Do not resuscitate: Secondary | ICD-10-CM | POA: Diagnosis present

## 2021-08-13 DIAGNOSIS — R296 Repeated falls: Secondary | ICD-10-CM | POA: Diagnosis present

## 2021-08-13 DIAGNOSIS — I5032 Chronic diastolic (congestive) heart failure: Secondary | ICD-10-CM | POA: Diagnosis present

## 2021-08-13 DIAGNOSIS — S7290XA Unspecified fracture of unspecified femur, initial encounter for closed fracture: Secondary | ICD-10-CM | POA: Diagnosis present

## 2021-08-13 DIAGNOSIS — I110 Hypertensive heart disease with heart failure: Secondary | ICD-10-CM | POA: Diagnosis present

## 2021-08-13 DIAGNOSIS — G309 Alzheimer's disease, unspecified: Secondary | ICD-10-CM | POA: Diagnosis present

## 2021-08-13 DIAGNOSIS — Z20822 Contact with and (suspected) exposure to covid-19: Secondary | ICD-10-CM | POA: Diagnosis present

## 2021-08-13 DIAGNOSIS — Y92009 Unspecified place in unspecified non-institutional (private) residence as the place of occurrence of the external cause: Secondary | ICD-10-CM | POA: Diagnosis not present

## 2021-08-13 DIAGNOSIS — W1830XA Fall on same level, unspecified, initial encounter: Secondary | ICD-10-CM | POA: Diagnosis present

## 2021-08-13 DIAGNOSIS — S7292XA Unspecified fracture of left femur, initial encounter for closed fracture: Principal | ICD-10-CM

## 2021-08-13 LAB — BASIC METABOLIC PANEL
Anion gap: 11 (ref 5–15)
BUN: 41 mg/dL — ABNORMAL HIGH (ref 8–23)
CO2: 23 mmol/L (ref 22–32)
Calcium: 8.6 mg/dL — ABNORMAL LOW (ref 8.9–10.3)
Chloride: 101 mmol/L (ref 98–111)
Creatinine, Ser: 1 mg/dL (ref 0.44–1.00)
GFR, Estimated: 53 mL/min — ABNORMAL LOW (ref >60)
Glucose, Bld: 116 mg/dL — ABNORMAL HIGH (ref 70–99)
Potassium: 4.3 mmol/L (ref 3.5–5.1)
Sodium: 135 mmol/L (ref 135–145)

## 2021-08-13 LAB — IRON AND TIBC
Iron: 38 ug/dL (ref 28–170)
Iron: 30 ug/dL (ref 28–170)
Saturation Ratios: 14 % (ref 10.4–31.8)
Saturation Ratios: 12 % (ref 10.4–31.8)
TIBC: 279 ug/dL (ref 250–450)
TIBC: 252 ug/dL (ref 250–450)
UIBC: 241 ug/dL
UIBC: 222 ug/dL

## 2021-08-13 LAB — RESP PANEL BY RT-PCR (FLU A&B, COVID) ARPGX2
Influenza A by PCR: NEGATIVE
Influenza B by PCR: NEGATIVE
SARS Coronavirus 2 by RT PCR: NEGATIVE

## 2021-08-13 LAB — FERRITIN
Ferritin: 228 ng/mL (ref 11–307)
Ferritin: 229 ng/mL (ref 11–307)

## 2021-08-13 LAB — SAMPLE TO BLOOD BANK

## 2021-08-13 LAB — CBC
HCT: 25 % — ABNORMAL LOW (ref 36.0–46.0)
Hemoglobin: 7.6 g/dL — ABNORMAL LOW (ref 12.0–15.0)
MCH: 29 pg (ref 26.0–34.0)
MCHC: 30.4 g/dL (ref 30.0–36.0)
MCV: 95.4 fL (ref 80.0–100.0)
Platelets: 213 10*3/uL (ref 150–400)
RBC: 2.62 MIL/uL — ABNORMAL LOW (ref 3.87–5.11)
RDW: 15.1 % (ref 11.5–15.5)
WBC: 10.6 10*3/uL — ABNORMAL HIGH (ref 4.0–10.5)
nRBC: 0 % (ref 0.0–0.2)

## 2021-08-13 MED ORDER — ONDANSETRON HCL 4 MG/2ML IJ SOLN
4.0000 mg | Freq: Once | INTRAMUSCULAR | Status: AC
  Administered 2021-08-13: 4 mg via INTRAVENOUS
  Filled 2021-08-13: qty 2

## 2021-08-13 MED ORDER — DIVALPROEX SODIUM 125 MG PO DR TAB
125.0000 mg | DELAYED_RELEASE_TABLET | Freq: Three times a day (TID) | ORAL | Status: DC
  Administered 2021-08-13: 125 mg via ORAL
  Filled 2021-08-13 (×2): qty 1

## 2021-08-13 MED ORDER — SODIUM CHLORIDE 0.9 % IV BOLUS
1000.0000 mL | Freq: Once | INTRAVENOUS | Status: AC
  Administered 2021-08-13: 1000 mL via INTRAVENOUS

## 2021-08-13 MED ORDER — OXYCODONE HCL 5 MG PO TABS
5.0000 mg | ORAL_TABLET | ORAL | Status: DC | PRN
  Administered 2021-08-14 – 2021-08-15 (×3): 5 mg via ORAL
  Filled 2021-08-13 (×4): qty 1

## 2021-08-13 MED ORDER — DILTIAZEM HCL 25 MG/5ML IV SOLN
10.0000 mg | Freq: Once | INTRAVENOUS | Status: AC
  Administered 2021-08-13: 10 mg via INTRAVENOUS
  Filled 2021-08-13: qty 5

## 2021-08-13 MED ORDER — METHOCARBAMOL 1000 MG/10ML IJ SOLN
500.0000 mg | Freq: Four times a day (QID) | INTRAVENOUS | Status: DC | PRN
  Administered 2021-08-13: 500 mg via INTRAVENOUS
  Filled 2021-08-13 (×2): qty 5

## 2021-08-13 MED ORDER — VITAMIN B-12 1000 MCG PO TABS
1000.0000 ug | ORAL_TABLET | Freq: Every day | ORAL | Status: DC
  Administered 2021-08-13 – 2021-08-16 (×3): 1000 ug via ORAL
  Filled 2021-08-13 (×3): qty 1

## 2021-08-13 MED ORDER — MORPHINE SULFATE (PF) 2 MG/ML IV SOLN
2.0000 mg | Freq: Once | INTRAVENOUS | Status: AC
  Administered 2021-08-13: 2 mg via INTRAVENOUS
  Filled 2021-08-13: qty 1

## 2021-08-13 MED ORDER — POTASSIUM CHLORIDE ER 10 MEQ PO TBCR
10.0000 meq | EXTENDED_RELEASE_TABLET | Freq: Every day | ORAL | Status: DC
  Administered 2021-08-15 – 2021-08-16 (×2): 10 meq via ORAL
  Filled 2021-08-13 (×5): qty 1

## 2021-08-13 MED ORDER — DOCUSATE SODIUM 100 MG PO CAPS
100.0000 mg | ORAL_CAPSULE | Freq: Two times a day (BID) | ORAL | Status: DC
  Administered 2021-08-13 (×2): 100 mg via ORAL
  Filled 2021-08-13 (×3): qty 1

## 2021-08-13 MED ORDER — CEFAZOLIN SODIUM-DEXTROSE 1-4 GM/50ML-% IV SOLN
1.0000 g | Freq: Once | INTRAVENOUS | Status: AC
  Administered 2021-08-14: 1 g via INTRAVENOUS
  Filled 2021-08-13: qty 50

## 2021-08-13 MED ORDER — PANTOPRAZOLE SODIUM 40 MG PO TBEC
40.0000 mg | DELAYED_RELEASE_TABLET | Freq: Every day | ORAL | Status: DC
  Administered 2021-08-13 – 2021-08-16 (×3): 40 mg via ORAL
  Filled 2021-08-13 (×3): qty 1

## 2021-08-13 MED ORDER — METHOCARBAMOL 500 MG PO TABS
500.0000 mg | ORAL_TABLET | Freq: Four times a day (QID) | ORAL | Status: DC | PRN
  Administered 2021-08-14 – 2021-08-16 (×4): 500 mg via ORAL
  Filled 2021-08-13 (×4): qty 1

## 2021-08-13 MED ORDER — FENTANYL CITRATE PF 50 MCG/ML IJ SOSY
100.0000 ug | PREFILLED_SYRINGE | Freq: Once | INTRAMUSCULAR | Status: AC
Start: 2021-08-13 — End: 2021-08-13
  Administered 2021-08-13: 100 ug via INTRAVENOUS
  Filled 2021-08-13: qty 2

## 2021-08-13 MED ORDER — METOPROLOL TARTRATE 25 MG PO TABS
25.0000 mg | ORAL_TABLET | Freq: Every day | ORAL | Status: DC
  Administered 2021-08-13 – 2021-08-16 (×3): 25 mg via ORAL
  Filled 2021-08-13 (×3): qty 1

## 2021-08-13 MED ORDER — POLYETHYLENE GLYCOL 3350 17 G PO PACK
17.0000 g | PACK | Freq: Every day | ORAL | Status: DC | PRN
  Administered 2021-08-13: 17 g via ORAL
  Filled 2021-08-13: qty 1

## 2021-08-13 MED ORDER — ONDANSETRON HCL 4 MG/2ML IJ SOLN: 4.0000 mg | Freq: Four times a day (QID) | INTRAMUSCULAR | Status: DC | PRN

## 2021-08-13 MED ORDER — FERROUS SULFATE 325 (65 FE) MG PO TABS
325.0000 mg | ORAL_TABLET | Freq: Every day | ORAL | Status: DC
  Administered 2021-08-15 – 2021-08-16 (×2): 325 mg via ORAL
  Filled 2021-08-13 (×2): qty 1

## 2021-08-13 MED ORDER — MORPHINE SULFATE (PF) 2 MG/ML IV SOLN
2.0000 mg | INTRAVENOUS | Status: DC | PRN
  Administered 2021-08-13 – 2021-08-14 (×2): 2 mg via INTRAVENOUS
  Filled 2021-08-13 (×2): qty 1

## 2021-08-13 MED ORDER — ONDANSETRON HCL 4 MG PO TABS: 4.0000 mg | ORAL_TABLET | Freq: Four times a day (QID) | ORAL | Status: DC | PRN

## 2021-08-13 NOTE — Assessment & Plan Note (Signed)
Patient on Depakote 3 times daily ?

## 2021-08-13 NOTE — Assessment & Plan Note (Addendum)
RVR on presentation to ED, improved ?Not on anticoagulation due to fall risk ?Continue telemetry ?Beta-blocker for rate control ?

## 2021-08-13 NOTE — ED Notes (Signed)
Pt transported to xray 

## 2021-08-13 NOTE — ED Triage Notes (Addendum)
Pt comes into the ED via [facility] from home c/o mechanical fall.  Pt was getting up when she fell, it was witnessed by family.  Pt has a pacemaker but her current rate is 110-140 in a-fib.  Pt had recentl left hip surgery 4-6 weeks ago.  Pt has bruising and swelling around the left hip.  Pt also c/o left knee pain.  Pt tearful at this time, but has even and unlabored respirations.  Pt has h/o dementia.  ? ?140/80 ?

## 2021-08-13 NOTE — ED Provider Notes (Signed)
? ?[facility] ?Provider Note ? ? ? Event Date/Time  ? First MD Initiated Contact with Patient 08/13/21 1100   ?  (approximate) ? ?History  ? ?Chief Complaint: Fall ? ?HPI ? ?Kristen Osborn is a 88 y.o. female with a recent hip surgery, dementia, CHF, atrial fibrillation, presents to the emergency department for a fall.  According to EMS patient is coming from home after a mechanical fall.  Patient was getting up when she fell this was witnessed by family members.  Patient has a history of atrial fibrillation, heart rate currently between 110 140 upon arrival.  Patient is complaining of pain to the left hip had surgery approximately 4 to 6 weeks ago per EMS report.  Patient does have bruising/hematoma to the left hip but this appears to be old on my evaluation.  Patient cannot contribute much to her history. ? ?Physical Exam  ? ?Triage Vital Signs: ?ED Triage Vitals  ?Enc Vitals Group  ?   BP 08/13/21 1102 (!) 162/89  ?   Pulse Rate 08/13/21 1102 (!) 108  ?   Resp 08/13/21 1102 18  ?   Temp 08/13/21 1102 97.7 ?F (36.5 ?C)  ?   Temp Source 08/13/21 1102 Oral  ?   SpO2 08/13/21 1102 100 %  ?   Weight 08/13/21 1100 100 lb 1.4 oz (45.4 kg)  ?   Height 08/13/21 1100 5\' 3"  (1.6 m)  ?   Head Circumference --   ?   Peak Flow --   ?   Pain Score --   ?   Pain Loc --   ?   Pain Edu? --   ?   Excl. in [place]? --   ? ? ?Most recent vital signs: ?Vitals:  ? 08/13/21 1102  ?BP: (!) 162/89  ?Pulse: (!) 108  ?Resp: 18  ?Temp: 97.7 ?F (36.5 ?C)  ?SpO2: 100%  ? ? ?General: Awake, no distress.  ?CV:  Irregular rhythm rate around 120 bpm. ?Resp:  Normal effort.  Equal breath sounds bilaterally.  ?Abd:  No distention.  Soft, nontender.  No rebound or guarding. ?Other:  Moderate tenderness palpation of the left hip with significant pain with attempted range of motion of the left hip.  Patient does have hematoma/ecchymosis noted to the left hip but appears to be older.  Neurovascular intact distally. ? ? ?ED Results /  Procedures / Treatments  ? ?EKG ? ?EKG viewed and interpreted by myself shows atrial fibrillation with 118 bpm with a narrow QRS, normal axis, normal intervals, nonspecific ST changes.  No ST elevation. ? ?RADIOLOGY ? ?I personally reviewed the hip x-ray images.  Patient appears to have a femur fracture periprosthetic fracture in the distal femur.  We will obtain dedicated femur x-rays. ?Femur x-ray shows periprosthetic distal femur fracture ? ? ?MEDICATIONS ORDERED IN ED: ?Medications  ?fentaNYL (SUBLIMAZE) injection 100 mcg (has no administration in time range)  ?ondansetron (ZOFRAN) injection 4 mg (has no administration in time range)  ? ? ? ?IMPRESSION / MDM / ASSESSMENT AND PLAN / ED COURSE  ?I reviewed the triage vital signs and the nursing notes. ? ?Patient presents emergency department after a fall at home.  Patient had a recent left hip surgery.  I reviewed the patient's discharge summary 07/29/2021 in which it notes the patient had a left intertrochanteric hip surgery performed by Dr. Roland Rack on 07/26/2021.  Has a history of atrial fibrillation but not currently anticoagulated due to high fall risk.  Advanced dementia. ? ?We will check labs, obtain CT imaging of the head given the fall as well as a left hip and pelvis x-ray.  We will continue to closely monitor, monitor heart rate, treat pain.  Awaiting family arrival for further history. ? ?Patient's x-rays have resulted showing a distal femur periprosthetic fracture.  Patient remains in A-fib with RVR we will give 10 mg of IV diltiazem as well as a liter of fluid and continue to closely monitor.  We will discuss with orthopedics for further care.  Daughter who is now here with the patient states the patient was walking prior to her initial hip fracture.  But also confirms advanced dementia ? ?I spoke with orthopedics.  Dr. Rudene Christians will perform the surgery tomorrow.  Patient will be admitted to the hospital service. ? ?FINAL CLINICAL IMPRESSION(S) / ED DIAGNOSES   ? ?Fall ?Left hip pain ?Left femur fracture ? ?Note:  This document was prepared using Dragon voice recognition software and may include unintentional dictation errors. ?  ?Harvest Dark, MD ?08/13/21 1435 ? ?

## 2021-08-13 NOTE — Assessment & Plan Note (Signed)
Orthopedics following ?Plan for surgery tomorrow, 08/14/2021 ?Splint is in place on left leg ?Pain control ?

## 2021-08-13 NOTE — Assessment & Plan Note (Addendum)
Hemoglobin normal at 12.23 1823, patient underwent surgery, hemoglobin prior to discharge was 9.3 on 07/27/2021 ?Hemoglobin on presentation to ED 08/13/2021 was 7.6 ?Closely monitor - appreciate recs from ortho if need transfusion prior to surgery  ?Otherwise monitor CBC, transfuse if Hgb <7 / symptoms develop ?

## 2021-08-13 NOTE — Progress Notes (Signed)
?ORTHOPAEDIC CONSULTATION ? ?REQUESTING PHYSICIAN: Minna Antis, MD ? ?Chief Complaint: Left distal femur periprosthetic fracture ? ?HPI: ?Kristen Osborn is a 88 y.o. female who complains of left hip pain status post fall.  Patient has a history of dementia.  The history is obtained by the patient's daughter who is with her in the emergency department.  Patient recently has undergone intramedullary fixation of a left intertrochanteric hip fracture by Dr. Joice Lofts on 07/26/2021.  X-rays in the emergency department revealed a left distal femur periprosthetic fracture with posterior translation of the distal femoral fracture.  Patient has a history of ORIF of right periprosthetic fracture on 08/03/2020 as well.  Patient is ambulatory at baseline according to the patient's daughter.  ? ?Past Medical History:  ?Diagnosis Date  ? Atrial fibrillation (HCC)   ? CHF (congestive heart failure) (HCC)   ? Dementia (HCC)   ? ?Past Surgical History:  ?Procedure Laterality Date  ? ABDOMINAL HYSTERECTOMY    ? CLOSED REDUCTION WRIST FRACTURE Right 06/28/2020  ? Procedure: CLOSED REDUCTION WRIST;  Surgeon: Lyndle Herrlich, MD;  Location: [facility] ORS;  Service: Orthopedics;  Laterality: Right;  ? HIP ARTHROPLASTY Right 06/28/2020  ? Procedure: ARTHROPLASTY BIPOLAR HIP (HEMIARTHROPLASTY);  Surgeon: Lyndle Herrlich, MD;  Location: [facility] ORS;  Service: Orthopedics;  Laterality: Right;  ? INTRAMEDULLARY (IM) NAIL INTERTROCHANTERIC Left 07/26/2021  ? Procedure: INTRAMEDULLARY (IM) NAIL INTERTROCHANTRIC;  Surgeon: Christena Flake, MD;  Location: [facility] ORS;  Service: Orthopedics;  Laterality: Left;  ? ORIF PERIPROSTHETIC FRACTURE Right 08/03/2020  ? Procedure: OPEN REDUCTION INTERNAL FIXATION (ORIF) PERIPROSTHETIC FRACTURE;  Surgeon: Lyndle Herrlich, MD;  Location: [facility] ORS;  Service: Orthopedics;  Laterality: Right;  ? PPM GENERATOR CHANGEOUT N/A 03/30/2021  ? Procedure: PPM GENERATOR CHANGEOUT;  Surgeon: Marcina Millard, MD;  Location: [facility]  INVASIVE CV LAB;  Service: Cardiovascular;  Laterality: N/A;  ? ?Social History  ? ?Socioeconomic History  ? Marital status: Married  ?  Spouse name: Not on file  ? Number of children: Not on file  ? Years of education: Not on file  ? Highest education level: Not on file  ?Occupational History  ? Not on file  ?Tobacco Use  ? Smoking status: Never  ? Smokeless tobacco: Never  ?Vaping Use  ? Vaping Use: Never used  ?Substance and Sexual Activity  ? Alcohol use: Not Currently  ? Drug use: Never  ? Sexual activity: Not Currently  ?Other Topics Concern  ? Not on file  ?Social History Narrative  ? lives with daughter  ? ?Social Determinants of Health  ? ?Financial Resource Strain: Not on file  ?Food Insecurity: Not on file  ?Transportation Needs: Not on file  ?Physical Activity: Not on file  ?Stress: Not on file  ?Social Connections: Not on file  ? ?History reviewed. No pertinent family history. ?Allergies  ?Allergen Reactions  ? Lisinopril Other (See Comments)  ?  Family is unaware of this allergy   ? ?Prior to Admission medications   ?Medication Sig Start Date End Date Taking? Authorizing Provider  ?aspirin 81 MG EC tablet Take by mouth.    [provider]  ?divalproex (DEPAKOTE) 125 MG DR tablet Take 125 mg by mouth in the morning, at noon, and at bedtime. 07/16/21   [provider]  ?enoxaparin (LOVENOX) 40 MG/0.4ML injection Inject 0.4 mLs (40 mg total) into the skin daily for 14 days. 07/27/21 08/10/21  Dedra Skeens, PA-C  ?ferrous sulfate 325 (65 FE) MG tablet Take 1 tablet (  325 mg total) by mouth daily with breakfast. ?Patient taking differently: Take 325 mg by mouth every other day. 10/25/20   Arnetha CourserAmin, Sumayya, MD  ?HYDROcodone-acetaminophen (NORCO/VICODIN) 5-325 MG tablet Take 1-2 tablets by mouth every 6 (six) hours as needed for moderate pain. 07/27/21   Dedra SkeensMundy, Todd, PA-C  ?metoprolol tartrate (LOPRESSOR) 25 MG tablet Take 25 mg by mouth daily. 06/07/21   [provider]  ?omeprazole (PRILOSEC)  20 MG capsule Take 20 mg by mouth daily. 04/17/20   [provider]  ?polyethylene glycol (MIRALAX / GLYCOLAX) 17 g packet Take 17 g by mouth daily as needed for mild constipation. 07/29/21   Enedina FinnerPatel, Sona, MD  ?potassium chloride (KLOR-CON) 8 MEQ tablet Take 8 mEq by mouth daily. 06/18/21   [provider]  ?vitamin B-12 (CYANOCOBALAMIN) 1000 MCG tablet Take 1,000 mcg by mouth daily.    [provider]  ? ?DG Knee Complete 4 Views Left ? ?Result Date: 08/13/2021 ?CLINICAL DATA:  left hip pain and left knee pain EXAM: LEFT KNEE - COMPLETE 4+ VIEW COMPARISON:  Left knee radiograph 10/24/2020 FINDINGS: There is an acute periprosthetic distal femoral diaphysis fracture displaced up to 1.1 cm posteriorly. IMPRESSION: Acute periprosthetic distal femoral diaphysis fracture displaced up to 1.1 cm posteriorly. Electronically Signed   By: Caprice RenshawJacob  Kahn M.D.   On: 08/13/2021 11:52  ? ?DG Hip Unilat With Pelvis 2-3 Views Left ? ?Result Date: 08/13/2021 ?CLINICAL DATA:  88 year old female with a history of left hip pain and left knee pain EXAM: DG HIP (WITH OR WITHOUT PELVIS) 2-3V LEFT COMPARISON:  07/24/2021, 07/11/2020, 10/24/2020, 07/26/2021 FINDINGS: Osteopenia. No acute displaced fracture. Surgical changes again demonstrated of right hip arthroplasty with cerclage fixation of the proximal femur incompletely imaged. Components appear aligned. Since the prior plain film there has been ORIF of left hip fracture, with incomplete healing/remodeling. Incomplete imaging a new femur fracture involving the IM rod with displacement at the fracture site. Vascular calcifications IMPRESSION: Surgical changes of left hip ORIF with incomplete healing/remodeling of the femoral neck. New distal femoral fracture identified, incompletely imaged on the current plain film series. A dedicated femur plain film series recommended. Redemonstration of right hip ORIF. Osteopenia Electronically Signed   By: Gilmer MorJaime  Wagner D.O.   On:  08/13/2021 11:53  ? ?DG Femur Min 2 Views Left ? ?Result Date: 08/13/2021 ?CLINICAL DATA:  Fracture EXAM: LEFT FEMUR 2 VIEWS COMPARISON:  Cindy radiograph dated August 13, 2021 of the left knee FINDINGS: Displaced periprosthetic femoral fracture. Intertrochanteric fracture of the proximal left femur which was seen on July 24, 2021 prior exam. Acute periprosthetic distal femoral diaphysis fracture. Surrounding soft tissue edema. Vascular calcifications. IMPRESSION: Displaced periprosthetic fracture of the distal left femur. Electronically Signed   By: Allegra LaiLeah  Strickland M.D.   On: 08/13/2021 12:24   ? ?Positive ROS: All other systems have been reviewed and were otherwise negative with the exception of those mentioned in the HPI and as above. ? ?Physical Exam: ?General: Awake, no acute distress.  Patient appears comfortable with her pain currently controlled. ? ?MUSCULOSKELETAL: Left lower extremity: Patient has diffuse swelling in the left thigh with ecchymosis posterior medially.  There is shortening of the left lower extremity.  Her thigh compartments are soft and compressible.  She has intact sensation to light touch distally along with palpable pedal pulses.  Patient has difficulty following commands but there is spontaneous movement of her toes on the left foot. ? ?Assessment: ?Left closed distal femur periprosthetic fracture around previous  intramedullary nail ? ?Plan: ?I spoke with the patient's daughter and explained to her the difficulty of this fracture.  I have discussed this case with Dr. Joice Lofts.  His partner Dr. Kennedy Bucker has agreed to fix this fracture.  Patient has atrial fibrillation with a pacemaker and is on aspirin.  She is not on additional anticoagulation.  I explained that this plan to the ED physician, Dr. Minna Antis.  Patient should be admitted to the hospital service for preoperative clearance.  She is n.p.o. after midnight and no anticoagulation therapy should be given in preparation for  surgery tomorrow. ? ? ?Juanell Fairly, MD ? ? ? ?08/13/2021 ?1:52 PM ? ?  ?

## 2021-08-13 NOTE — H&P (Signed)
? ? ?HISTORY AND PHYSICAL ? ?Patient: Kristen Osborn 88 y.o. female ?MRN: [id number] ? ?Today is hospital day 0 after presenting to ED on 08/13/2021 10:57 AM with  ?Chief Complaint  ?Patient presents with  ? Fall  ? ? ? ?RECORD REVIEW AND HOSPITAL COURSE: ?Kristen Osborn is a 88 year old female with past medical history atrial fibrillation, CHF, dementia, recent treatment for femur fracture with ORIF left hip 07/26/2021, presented to the ED today, 08/13/2021, after a fall at home.  Diagnosed with displaced periprosthetic fracture distal to the joint prosthesis in the left femur.  Also noted to be anemic, hemoglobin 7.6, decreased from 9.3 on 07/27/2021 (just prior to discharge from her last admission).  Also noted to be tachycardic/A-fib RVR, patient received 10 mg diltiazem in the ED and heart rate improved somewhat.  Other ED treatments include fentanyl, Zofran, normal saline 1 L.  Foley catheter was placed.  ?Subjective/HPI: Patient is seen and examined resting comfortably in bed in the emergency department, no apparent distress.  No family present at bedside.  Patient is alert, she is pleasant but she is not oriented to place or time ? ?Procedures and Significant Results:  ?ORIF distal femur fracture planned for 08/14/2021 ? ?Consultants:  ?Orthopedics ? ? ? ? ? ?ASSESSMENT & PLAN ? ?Femur fracture (HCC) ?Orthopedics following ?Plan for surgery tomorrow, 08/14/2021 ?Splint is in place on left leg ?Pain control ? ?Chronic a-fib (HCC) ?RVR on presentation to ED, improved ?Not on anticoagulation due to fall risk ?Continue telemetry ?Beta-blocker for rate control ? ?Alzheimer's dementia without behavioral disturbance (HCC) ?Patient on Depakote 3 times daily ? ?Anemia ?Hemoglobin normal at 12.23 1823, patient underwent surgery, hemoglobin prior to discharge was 9.3 on 07/27/2021 ?Hemoglobin on presentation to ED 08/13/2021 was 7.6 ?Closely monitor - appreciate recs from ortho if need transfusion prior to surgery  ?Otherwise monitor  CBC, transfuse if Hgb <7 / symptoms develop ? ? ? ?VTE Ppx: SCD on unaffected leg ?CODE STATUS: DNR (reviewed MOST form) ?Admitted from: home ?Expected Dispo: likely SNF, TOC consult placed  ?Barriers to discharge: surgical procedure, likely SNF placement  ?Family communication: none at this time -orthopedics has spoken w/ family today, medicine team will call w/ any other updates as needed  ? ? ? ? ? ? ? ? ? ? ? ? ? ?Past Medical History:  ?Diagnosis Date  ? Atrial fibrillation (HCC)   ? CHF (congestive heart failure) (HCC)   ? Dementia (HCC)   ? ? ?Past Surgical History:  ?Procedure Laterality Date  ? ABDOMINAL HYSTERECTOMY    ? CLOSED REDUCTION WRIST FRACTURE Right 06/28/2020  ? Procedure: CLOSED REDUCTION WRIST;  Surgeon: Lyndle HerrlichBowers, James R, MD;  Location: [facility] OR[facility];  Service: Orthopedics;  Laterality: Right;  ? HIP ARTHROPLASTY Right 06/28/2020  ? Procedure: ARTHROPLASTY BIPOLAR HIP (HEMIARTHROPLASTY);  Surgeon: Lyndle HerrlichBowers, James R, MD;  Location: [facility];  Service: Orthopedics;  Laterality: Right;  ? INTRAMEDULLARY (IM) NAIL INTERTROCHANTERIC Left 07/26/2021  ? Procedure: INTRAMEDULLARY (IM) NAIL INTERTROCHANTRIC;  Surgeon: Christena FlakePoggi, John J, MD;  Location: [facility];  Service: Orthopedics;  Laterality: Left;  ? ORIF PERIPROSTHETIC FRACTURE Right 08/03/2020  ? Procedure: OPEN REDUCTION INTERNAL FIXATION (ORIF) PERIPROSTHETIC FRACTURE;  Surgeon: Lyndle HerrlichBowers, James R, MD;  Location: [facility];  Service: Orthopedics;  Laterality: Right;  ? PPM GENERATOR CHANGEOUT N/A 03/30/2021  ? Procedure: PPM GENERATOR CHANGEOUT;  Surgeon: Marcina MillardParaschos, Keir Foland, MD;  Location: [facility];  Service: Cardiovascular;  Laterality: N/A;  ? ? ?History reviewed. No pertinent  family history. ?Social History:  reports that she has never smoked. She has never used smokeless tobacco. She reports that she does not currently use alcohol. She reports that she does not use drugs. ? ?Allergies:  ?Allergies  ?Allergen Reactions  ? Lisinopril Other (See  Comments)  ?  Family is unaware of this allergy   ? ? ?No current facility-administered medications on file prior to encounter.  ? ?Current Outpatient Medications on File Prior to Encounter  ?Medication Sig Dispense Refill  ? aspirin 81 MG EC tablet Take by mouth.    ? divalproex (DEPAKOTE) 125 MG DR tablet Take 125 mg by mouth in the morning, at noon, and at bedtime.    ? enoxaparin (LOVENOX) 40 MG/0.4ML injection Inject 0.4 mLs (40 mg total) into the skin daily for 14 days. 5.6 mL 0  ? ferrous sulfate 325 (65 FE) MG tablet Take 1 tablet (325 mg total) by mouth daily with breakfast. (Patient taking differently: Take 325 mg by mouth every other day.)  3  ? HYDROcodone-acetaminophen (NORCO/VICODIN) 5-325 MG tablet Take 1-2 tablets by mouth every 6 (six) hours as needed for moderate pain. 30 tablet 0  ? metoprolol tartrate (LOPRESSOR) 25 MG tablet Take 25 mg by mouth daily.    ? omeprazole (PRILOSEC) 20 MG capsule Take 20 mg by mouth daily.    ? polyethylene glycol (MIRALAX / GLYCOLAX) 17 g packet Take 17 g by mouth daily as needed for mild constipation. 14 each 0  ? potassium chloride (KLOR-CON) 8 MEQ tablet Take 8 mEq by mouth daily.    ? vitamin B-12 (CYANOCOBALAMIN) 1000 MCG tablet Take 1,000 mcg by mouth daily.    ? ? ? ?Results for orders placed or performed during the hospital encounter of 08/13/21 (from the past 48 hour(s))  ?CBC     Status: Abnormal  ? Collection Time: 08/13/21 11:04 AM  ?Result Value Ref Range  ? WBC 10.6 (H) 4.0 - 10.5 K/uL  ? RBC 2.62 (L) 3.87 - 5.11 MIL/uL  ? Hemoglobin 7.6 (L) 12.0 - 15.0 g/dL  ? HCT 25.0 (L) 36.0 - 46.0 %  ? MCV 95.4 80.0 - 100.0 fL  ? MCH 29.0 26.0 - 34.0 pg  ? MCHC 30.4 30.0 - 36.0 g/dL  ? RDW 15.1 11.5 - 15.5 %  ? Platelets 213 150 - 400 K/uL  ? nRBC 0.0 0.0 - 0.2 %  ?  Comment: Performed at [facility], [address], [place], [place] [address]  ?Basic metabolic panel     Status: Abnormal  ? Collection Time: 08/13/21 11:04 AM  ?Result Value Ref Range  ?  Sodium 135 135 - 145 mmol/L  ? Potassium 4.3 3.5 - 5.1 mmol/L  ? Chloride 101 98 - 111 mmol/L  ? CO2 23 22 - 32 mmol/L  ? Glucose, Bld 116 (H) 70 - 99 mg/dL  ?  Comment: Glucose reference range applies only to samples taken after fasting for at least 8 hours.  ? BUN 41 (H) 8 - 23 mg/dL  ? Creatinine, Ser 1.00 0.44 - 1.00 mg/dL  ? Calcium 8.6 (L) 8.9 - 10.3 mg/dL  ? GFR, Estimated 53 (L) >60 mL/min  ?  Comment: (NOTE) ?Calculated using the CKD-EPI Creatinine Equation (2021) ?  ? Anion gap 11 5 - 15  ?  Comment: Performed at [facility], [address], [place], [place] [address]  ?Sample to Blood Bank     Status: None  ? Collection Time: 08/13/21  11:57 AM  ?Result Value Ref Range  ? Blood Bank Specimen SAMPLE AVAILABLE FOR TESTING   ? Sample Expiration    ?  08/16/2021,2359 ?Performed at [facility], [address], [place], [place] [address] ?  ?Resp Panel by RT-PCR (Flu A&B, Covid) Nasopharyngeal Swab     Status: None  ? Collection Time: 08/13/21  2:24 PM  ? Specimen: Nasopharyngeal Swab; Nasopharyngeal(NP) swabs in vial transport medium  ?Result Value Ref Range  ? SARS Coronavirus 2 by RT PCR NEGATIVE NEGATIVE  ?  Comment: (NOTE) ?SARS-CoV-2 target nucleic acids are NOT DETECTED. ? ?The SARS-CoV-2 RNA is generally detectable in upper respiratory ?specimens during the acute phase of infection. The lowest ?concentration of SARS-CoV-2 viral copies this assay can detect is ?138 copies/mL. A negative result does not preclude SARS-Cov-2 ?infection and should not be used as the sole basis for treatment or ?other patient management decisions. A negative result may occur with  ?improper specimen collection/handling, submission of specimen other ?than nasopharyngeal swab, presence of viral mutation(s) within the ?areas targeted by this assay, and inadequate number of viral ?copies(<138 copies/mL). A negative result must be combined with ?clinical observations, patient history, and  epidemiological ?information. The expected result is Negative. ? ?Fact Sheet for Patients:  ?BloggerCourse.com ? ?Fact Sheet for Healthcare Providers:  ?SeriousBroker.it ? ?This

## 2021-08-13 NOTE — Plan of Care (Signed)
?  Problem: Clinical Measurements: ?Goal: Ability to maintain clinical measurements within normal limits will improve ?Outcome: Progressing ?Goal: Will remain free from infection ?Outcome: Progressing ?  ?Problem: Nutrition: ?Goal: Adequate nutrition will be maintained ?Outcome: Progressing ?  ?Problem: Elimination: ?Goal: Will not experience complications related to bowel motility ?Outcome: Progressing ?Goal: Will not experience complications related to urinary retention ?Outcome: Progressing ?  ?Problem: Safety: ?Goal: Ability to remain free from injury will improve ?Outcome: Progressing ?  ?Problem: Skin Integrity: ?Goal: Risk for impaired skin integrity will decrease ?Outcome: Progressing ?  ?

## 2021-08-13 NOTE — ED Notes (Signed)
Ortho MD at bedside.

## 2021-08-13 NOTE — ED Notes (Signed)
This RN attempted Foley Catheter insertion with no success.  Second RN attempt, also with no success.  External Catheter placed on patient.  ?

## 2021-08-13 NOTE — Consult Note (Signed)
Patient is seen at the request of Dr. Martha Clan.  She suffered a fracture around prior long femoral nail that Dr. Joice Lofts put in over 2 weeks ago.  There is a complex fracture that I have experience with. ?Patient was seen in the ER and talked with her daughter as well.  She has been living at home with a great deal of assistance secondary to dementia.  Reviewing prior x-rays there was no reason to expect this fracture hardware not impinging on the anterior cortex as often happens.  Suspect that there is a twisting injury and prior broke through the screw hole of the distal interlock.  Plan on ORIF with locking plate and proximal wires.  That surgery is scheduled for tomorrow morning. ?On exam her leg is shortened and externally rotated with her able to flex extend the toes sensation intact.  History is not obtainable from patient's secondary to confusion obtained from daughter and records ?

## 2021-08-14 ENCOUNTER — Inpatient Hospital Stay: Payer: Medicare Other | Admitting: Anesthesiology

## 2021-08-14 ENCOUNTER — Inpatient Hospital Stay: Payer: Medicare Other

## 2021-08-14 ENCOUNTER — Other Ambulatory Visit: Payer: Self-pay

## 2021-08-14 ENCOUNTER — Encounter
Admission: EM | Disposition: A | Discharge status: Home Health | Payer: Self-pay | Source: Home / Self Care | Attending: Internal Medicine

## 2021-08-14 DIAGNOSIS — G309 Alzheimer's disease, unspecified: Secondary | ICD-10-CM

## 2021-08-14 DIAGNOSIS — S7292XA Unspecified fracture of left femur, initial encounter for closed fracture: Secondary | ICD-10-CM

## 2021-08-14 DIAGNOSIS — I482 Chronic atrial fibrillation, unspecified: Secondary | ICD-10-CM | POA: Diagnosis not present

## 2021-08-14 DIAGNOSIS — F0280 Dementia in other diseases classified elsewhere without behavioral disturbance: Secondary | ICD-10-CM

## 2021-08-14 DIAGNOSIS — D649 Anemia, unspecified: Secondary | ICD-10-CM | POA: Diagnosis not present

## 2021-08-14 HISTORY — PX: APPLICATION OF WOUND VAC: SHX5189

## 2021-08-14 HISTORY — PX: ORIF FEMUR FRACTURE: SHX2119

## 2021-08-14 LAB — CBC
HCT: 22 % — ABNORMAL LOW (ref 36.0–46.0)
Hemoglobin: 7 g/dL — ABNORMAL LOW (ref 12.0–15.0)
MCH: 29.5 pg (ref 26.0–34.0)
MCHC: 31.8 g/dL (ref 30.0–36.0)
MCV: 92.8 fL (ref 80.0–100.0)
Platelets: 189 10*3/uL (ref 150–400)
RBC: 2.37 MIL/uL — ABNORMAL LOW (ref 3.87–5.11)
RDW: 15.4 % (ref 11.5–15.5)
WBC: 11.5 10*3/uL — ABNORMAL HIGH (ref 4.0–10.5)
nRBC: 0 % (ref 0.0–0.2)

## 2021-08-14 LAB — BASIC METABOLIC PANEL
Anion gap: 9 (ref 5–15)
BUN: 40 mg/dL — ABNORMAL HIGH (ref 8–23)
CO2: 23 mmol/L (ref 22–32)
Calcium: 8.4 mg/dL — ABNORMAL LOW (ref 8.9–10.3)
Chloride: 102 mmol/L (ref 98–111)
Creatinine, Ser: 1.07 mg/dL — ABNORMAL HIGH (ref 0.44–1.00)
GFR, Estimated: 49 mL/min — ABNORMAL LOW (ref >60)
Glucose, Bld: 113 mg/dL — ABNORMAL HIGH (ref 70–99)
Potassium: 4.4 mmol/L (ref 3.5–5.1)
Sodium: 134 mmol/L — ABNORMAL LOW (ref 135–145)

## 2021-08-14 SURGERY — OPEN REDUCTION INTERNAL FIXATION (ORIF) DISTAL FEMUR FRACTURE
Anesthesia: General | Site: Hip | Laterality: Left

## 2021-08-14 MED ORDER — ONDANSETRON HCL 4 MG PO TABS: 4.0000 mg | ORAL_TABLET | Freq: Four times a day (QID) | ORAL | Status: DC | PRN

## 2021-08-14 MED ORDER — FENTANYL CITRATE (PF) 100 MCG/2ML IJ SOLN
INTRAMUSCULAR | Status: AC
  Administered 2021-08-14: 25 ug via INTRAVENOUS
  Filled 2021-08-14: qty 2

## 2021-08-14 MED ORDER — ONDANSETRON HCL 4 MG/2ML IJ SOLN
INTRAMUSCULAR | Status: DC | PRN
  Administered 2021-08-14: 4 mg via INTRAVENOUS

## 2021-08-14 MED ORDER — OXYCODONE HCL 5 MG PO TABS: 5.0000 mg | ORAL_TABLET | Freq: Once | ORAL | Status: DC | PRN

## 2021-08-14 MED ORDER — PROPOFOL 10 MG/ML IV BOLUS
INTRAVENOUS | Status: DC | PRN
  Administered 2021-08-14: 40 mg via INTRAVENOUS

## 2021-08-14 MED ORDER — 0.9 % SODIUM CHLORIDE (POUR BTL) OPTIME
TOPICAL | Status: DC | PRN
  Administered 2021-08-14: 500 mL

## 2021-08-14 MED ORDER — METOCLOPRAMIDE HCL 10 MG PO TABS: 5.0000 mg | ORAL_TABLET | Freq: Three times a day (TID) | ORAL | Status: DC | PRN

## 2021-08-14 MED ORDER — METOPROLOL TARTRATE 5 MG/5ML IV SOLN
5.0000 mg | Freq: Once | INTRAVENOUS | Status: AC
Start: 2021-08-14 — End: 2021-08-14
  Administered 2021-08-14: 5 mg via INTRAVENOUS
  Filled 2021-08-14: qty 5

## 2021-08-14 MED ORDER — ONDANSETRON HCL 4 MG/2ML IJ SOLN: 4.0000 mg | Freq: Four times a day (QID) | INTRAMUSCULAR | Status: DC | PRN

## 2021-08-14 MED ORDER — PHENOL 1.4 % MT LIQD: 1.0000 | OROMUCOSAL | Status: DC | PRN

## 2021-08-14 MED ORDER — METOCLOPRAMIDE HCL 5 MG/ML IJ SOLN: 5.0000 mg | Freq: Three times a day (TID) | INTRAMUSCULAR | Status: DC | PRN

## 2021-08-14 MED ORDER — ONDANSETRON HCL 4 MG/2ML IJ SOLN
INTRAMUSCULAR | Status: AC
  Filled 2021-08-14: qty 2

## 2021-08-14 MED ORDER — LORAZEPAM 2 MG/ML IJ SOLN
1.0000 mg | Freq: Once | INTRAMUSCULAR | Status: AC
  Administered 2021-08-14: 1 mg via INTRAVENOUS
  Filled 2021-08-14: qty 1

## 2021-08-14 MED ORDER — SODIUM CHLORIDE 0.9 % IV SOLN: INTRAVENOUS | Status: DC

## 2021-08-14 MED ORDER — CEFAZOLIN SODIUM-DEXTROSE 1-4 GM/50ML-% IV SOLN
INTRAVENOUS | Status: AC
  Filled 2021-08-14: qty 50

## 2021-08-14 MED ORDER — FENTANYL CITRATE (PF) 100 MCG/2ML IJ SOLN: 25.0000 ug | INTRAMUSCULAR | Status: DC | PRN

## 2021-08-14 MED ORDER — MAGNESIUM HYDROXIDE 400 MG/5ML PO SUSP
30.0000 mL | Freq: Every day | ORAL | Status: DC | PRN
  Administered 2021-08-15: 30 mL via ORAL
  Filled 2021-08-14: qty 30

## 2021-08-14 MED ORDER — FENTANYL CITRATE (PF) 100 MCG/2ML IJ SOLN
INTRAMUSCULAR | Status: AC
  Filled 2021-08-14: qty 2

## 2021-08-14 MED ORDER — ACETAMINOPHEN 325 MG PO TABS: 325.0000 mg | ORAL_TABLET | Freq: Four times a day (QID) | ORAL | Status: DC | PRN

## 2021-08-14 MED ORDER — ACETAMINOPHEN 10 MG/ML IV SOLN
INTRAVENOUS | Status: DC | PRN
  Administered 2021-08-14: 500 mg via INTRAVENOUS

## 2021-08-14 MED ORDER — LIDOCAINE HCL (CARDIAC) PF 100 MG/5ML IV SOSY
PREFILLED_SYRINGE | INTRAVENOUS | Status: DC | PRN
  Administered 2021-08-14: 40 mg via INTRAVENOUS

## 2021-08-14 MED ORDER — NEOMYCIN-POLYMYXIN B GU 40-200000 IR SOLN
Status: DC | PRN
  Administered 2021-08-14: 2 mL

## 2021-08-14 MED ORDER — ACETAMINOPHEN 10 MG/ML IV SOLN
INTRAVENOUS | Status: AC
  Filled 2021-08-14: qty 100

## 2021-08-14 MED ORDER — ENOXAPARIN SODIUM 40 MG/0.4ML IJ SOSY
40.0000 mg | PREFILLED_SYRINGE | INTRAMUSCULAR | Status: DC
  Administered 2021-08-15 – 2021-08-16 (×2): 40 mg via SUBCUTANEOUS
  Filled 2021-08-14 (×2): qty 0.4

## 2021-08-14 MED ORDER — PROPOFOL 10 MG/ML IV BOLUS
INTRAVENOUS | Status: AC
  Filled 2021-08-14: qty 20

## 2021-08-14 MED ORDER — LIDOCAINE HCL (PF) 2 % IJ SOLN
INTRAMUSCULAR | Status: AC
  Filled 2021-08-14: qty 5

## 2021-08-14 MED ORDER — OXYCODONE HCL 5 MG/5ML PO SOLN: 5.0000 mg | Freq: Once | ORAL | Status: DC | PRN

## 2021-08-14 MED ORDER — SODIUM CHLORIDE FLUSH 0.9 % IV SOLN
INTRAVENOUS | Status: AC
  Filled 2021-08-14: qty 3

## 2021-08-14 MED ORDER — HALOPERIDOL LACTATE 5 MG/ML IJ SOLN: 0.5000 mg | Freq: Four times a day (QID) | INTRAMUSCULAR | Status: DC | PRN

## 2021-08-14 MED ORDER — MENTHOL 3 MG MT LOZG: 1.0000 | LOZENGE | OROMUCOSAL | Status: DC | PRN

## 2021-08-14 MED ORDER — CHLORHEXIDINE GLUCONATE CLOTH 2 % EX PADS
6.0000 | MEDICATED_PAD | Freq: Every day | CUTANEOUS | Status: DC
  Administered 2021-08-14: 6 via TOPICAL

## 2021-08-14 MED ORDER — BISACODYL 10 MG RE SUPP
10.0000 mg | Freq: Every day | RECTAL | Status: DC | PRN
  Filled 2021-08-14: qty 1

## 2021-08-14 MED ORDER — ENSURE ENLIVE PO LIQD
237.0000 mL | Freq: Two times a day (BID) | ORAL | Status: DC
  Administered 2021-08-15 – 2021-08-16 (×2): 237 mL via ORAL

## 2021-08-14 MED ORDER — PHENYLEPHRINE HCL-NACL 20-0.9 MG/250ML-% IV SOLN
INTRAVENOUS | Status: DC | PRN
  Administered 2021-08-14: 10 ug/min via INTRAVENOUS

## 2021-08-14 MED ORDER — ALUM & MAG HYDROXIDE-SIMETH 200-200-20 MG/5ML PO SUSP: 30.0000 mL | ORAL | Status: DC | PRN

## 2021-08-14 MED ORDER — FENTANYL CITRATE (PF) 100 MCG/2ML IJ SOLN
INTRAMUSCULAR | Status: DC | PRN
Start: 2021-08-14 — End: 2021-08-14
  Administered 2021-08-14 (×4): 50 ug via INTRAVENOUS

## 2021-08-14 MED ORDER — SODIUM CHLORIDE 0.9% IV SOLUTION: Freq: Once | INTRAVENOUS | Status: DC

## 2021-08-14 MED ORDER — DOCUSATE SODIUM 100 MG PO CAPS
100.0000 mg | ORAL_CAPSULE | Freq: Two times a day (BID) | ORAL | Status: DC
  Administered 2021-08-14 – 2021-08-16 (×4): 100 mg via ORAL
  Filled 2021-08-14 (×4): qty 1

## 2021-08-14 MED ORDER — SODIUM CHLORIDE 0.9 % IV SOLN: INTRAVENOUS | Status: DC | PRN

## 2021-08-14 MED ORDER — VALPROATE SODIUM 100 MG/ML IV SOLN
125.0000 mg | Freq: Three times a day (TID) | INTRAVENOUS | Status: DC
  Administered 2021-08-14 – 2021-08-15 (×4): 125 mg via INTRAVENOUS
  Filled 2021-08-14 (×5): qty 1.25

## 2021-08-14 MED ORDER — ADULT MULTIVITAMIN W/MINERALS CH
1.0000 | ORAL_TABLET | Freq: Every day | ORAL | Status: DC
  Administered 2021-08-15 – 2021-08-16 (×2): 1 via ORAL
  Filled 2021-08-14 (×2): qty 1

## 2021-08-14 MED ORDER — CEFAZOLIN SODIUM-DEXTROSE 1-4 GM/50ML-% IV SOLN
1.0000 g | Freq: Four times a day (QID) | INTRAVENOUS | Status: AC
  Administered 2021-08-14 – 2021-08-15 (×3): 1 g via INTRAVENOUS
  Filled 2021-08-14 (×3): qty 50

## 2021-08-14 MED ORDER — ONDANSETRON HCL 4 MG/2ML IJ SOLN: 4.0000 mg | Freq: Once | INTRAMUSCULAR | Status: DC | PRN

## 2021-08-14 SURGICAL SUPPLY — 54 items
BIT DRILL 4.3 CANNULATED (BIT) IMPLANT
BIT DRILL GUIDEWIRE 2.5X200 (WIRE) ×1 IMPLANT
BIT DRILL Q/COUPLING 1 (BIT) ×1 IMPLANT
CABLE 1.7 (Orthopedic Implant) ×3 IMPLANT
CHLORAPREP W/TINT 26 (MISCELLANEOUS) ×2 IMPLANT
DRAPE C-ARM XRAY 36X54 (DRAPES) ×2 IMPLANT
DRAPE C-ARMOR (DRAPES) ×2 IMPLANT
DRILL BIT 4.3 CANNULATED (BIT) ×2
DRSG MEPILEX SACRM 8.7X9.8 (GAUZE/BANDAGES/DRESSINGS) ×1 IMPLANT
ELECT CAUTERY BLADE 6.4 (BLADE) ×1 IMPLANT
ELECT REM PT RETURN 9FT ADLT (ELECTROSURGICAL) ×2
ELECTRODE REM PT RTRN 9FT ADLT (ELECTROSURGICAL) ×1 IMPLANT
GAUZE 4X4 16PLY ~~LOC~~+RFID DBL (SPONGE) ×1 IMPLANT
GAUZE SPONGE 4X4 12PLY STRL (GAUZE/BANDAGES/DRESSINGS) ×2 IMPLANT
GAUZE XEROFORM 1X8 LF (GAUZE/BANDAGES/DRESSINGS) ×2 IMPLANT
GLOVE SURG SYN 9.0  PF PI (GLOVE) ×1
GLOVE SURG SYN 9.0 PF PI (GLOVE) ×1 IMPLANT
GLOVE SURG UNDER POLY LF SZ9 (GLOVE) ×2 IMPLANT
GOWN SRG 2XL LVL 4 RGLN SLV (GOWNS) ×1 IMPLANT
GOWN STRL NON-REIN 2XL LVL4 (GOWNS) ×1
GOWN STRL REUS W/ TWL LRG LVL3 (GOWN DISPOSABLE) ×1 IMPLANT
GOWN STRL REUS W/TWL LRG LVL3 (GOWN DISPOSABLE) ×1
HEMOVAC 400ML (MISCELLANEOUS) ×2
HOLSTER ELECTROSUGICAL PENCIL (MISCELLANEOUS) ×2 IMPLANT
KIT DRAIN HEMOVAC JP 7FR 400ML (MISCELLANEOUS) ×1 IMPLANT
KIT PREVENA INCISION MGT20CM45 (CANNISTER) ×1 IMPLANT
KIT TURNOVER KIT A (KITS) ×2 IMPLANT
MANIFOLD NEPTUNE II (INSTRUMENTS) ×2 IMPLANT
MAT ABSORB  FLUID 56X50 GRAY (MISCELLANEOUS) ×1
MAT ABSORB FLUID 56X50 GRAY (MISCELLANEOUS) ×1 IMPLANT
NDL FILTER BLUNT 18X1 1/2 (NEEDLE) ×1 IMPLANT
NEEDLE FILTER BLUNT 18X 1/2SAF (NEEDLE) ×1
NEEDLE FILTER BLUNT 18X1 1/2 (NEEDLE) ×1 IMPLANT
NS IRRIG 500ML POUR BTL (IV SOLUTION) ×2 IMPLANT
PACK HIP PROSTHESIS (MISCELLANEOUS) ×2 IMPLANT
PAD ABD DERMACEA PRESS 5X9 (GAUZE/BANDAGES/DRESSINGS) ×1 IMPLANT
PENCIL ELECTRO HAND CTR (MISCELLANEOUS) ×1 IMPLANT
PLATE VA LCPCONDYLAR 12H (Plate) ×1 IMPLANT
SCALPEL PROTECTED #10 DISP (BLADE) ×4 IMPLANT
SCREW CAN LOCKING 5.0X65 (Screw) ×1 IMPLANT
SCREW CANN LOCKING VA 5.0X90MM (Screw) ×1 IMPLANT
SCREW CORTEX ST 4.5X22 (Screw) ×1 IMPLANT
SCREW VA CANNUL LOCK 5.0X60MM (Screw) ×2 IMPLANT
SCREW VA LOCKING 5.0X50MM (Screw) ×1 IMPLANT
SCREW VA LOCKING 5.0X65 (Screw) ×1 IMPLANT
SPONGE T-LAP 18X18 ~~LOC~~+RFID (SPONGE) ×7 IMPLANT
STAPLER SKIN PROX 35W (STAPLE) ×2 IMPLANT
SUT VIC AB 0 CT1 36 (SUTURE) ×4 IMPLANT
SUT VIC AB 1 CT1 36 (SUTURE) ×1 IMPLANT
SUT VIC AB 2-0 CT1 27 (SUTURE) ×2
SUT VIC AB 2-0 CT1 TAPERPNT 27 (SUTURE) ×2 IMPLANT
SYR 5ML LL (SYRINGE) ×2 IMPLANT
TAPE MICROFOAM 4IN (TAPE) ×2 IMPLANT
WATER STERILE IRR 500ML POUR (IV SOLUTION) ×2 IMPLANT

## 2021-08-14 NOTE — Anesthesia Preprocedure Evaluation (Signed)
Anesthesia Evaluation  ?Patient identified by MRN, date of birth, ID band ?Patient confused ? ?General Assessment Comment: ?Patient confused, has significant dementia at baseline per daughters at bedside. ? ?Has been in Afib with RVR earlier, but HR now in the 90s-100s. ? ?Hgb 7.0 this morning, no obvious source of bleeding. Will transfuse 1u PRBC prior to OR ? ?Reviewed: ?Allergy & Precautions, H&P , NPO status , Patient's Chart, lab work & pertinent test results, reviewed documented beta blocker date and time  ? ?History of Anesthesia Complications ?Negative for: history of anesthetic complications ? ?Airway ?Mallampati: III ? ?TM Distance: >3 FB ?Neck ROM: Limited ? ? ? Dental ? ?(+) Poor Dentition ?  ?Pulmonary ?neg pulmonary ROS,  ? Tachypnea ? ? ?+ decreased breath sounds ? ? ? ? ? Cardiovascular ?Exercise Tolerance: Poor ?hypertension, On Medications and Pt. on home beta blockers ?+ Peripheral Vascular Disease and +CHF  ?+ dysrhythmias (SSS s/p pacer) Atrial Fibrillation + pacemaker (recent battery change out on 03/30/2021.) + Valvular Problems/Murmurs (moderate mitral regurgitation)  ?Rhythm:Irregular Rate:Tachycardia ? ?ECHO 2022: ?INTERPRETATION  ?NORMAL LEFT VENTRICULAR SYSTOLIC FUNCTION ? WITH MODERATE LVH  ?MILD RV SYSTOLIC DYSFUNCTION (See above)  ?MODERATE VALVULAR REGURGITATION (See above)  ?NO VALVULAR STENOSIS  ?IRREGULAR HEART RHYTHM CAPTURED THROUGHOUT EXAM  ?ESTIMATED LVEF 50%  ?Aortic: MODERATE AI  ?MODERATELY THICKENED AOV LEAFLETS WITH NO EVIDENCE OF STENOSIS  ?Mitral: MODERATE MR  ?MODERATE MITRAL ANNULAR CALCIFICATION  ?Tricuspid:?MODERATE TO SEVERE TR (3.52m/s)  ?PACER WIRE NOTED IN RV/RA MAY BE UNDERESTIMATING TR GRADIENT  ?Pulmonic: MILD PI  ? ?  ?Neuro/Psych ?PSYCHIATRIC DISORDERS Dementia negative neurological ROS ?   ? GI/Hepatic ?Neg liver ROS, GERD  Medicated,  ?Endo/Other  ?negative endocrine ROS ? Renal/GU ?Renal InsufficiencyRenal disease   ?negative genitourinary ?  ?Musculoskeletal ? ? Abdominal ?Normal abdominal exam  (+)   ?Peds ? Hematology ? ?(+) Blood dyscrasia, anemia , thrombocytopenia   ?Anesthesia Other Findings ? ?Past Medical History: ?No date: Atrial fibrillation ([place]) ?No date: CHF (congestive heart failure) ([place]) ?No date: Dementia [name]) ? ?Past Surgical History: ?No date: ABDOMINAL HYSTERECTOMY ? ?BMI   ? Body Mass Index: 15.87 kg/m?  ?  ? ? Reproductive/Obstetrics ?negative OB ROS ? ?  ? ? ? ? ? ? ? ? ? ? ? ? ? ?  ?  ? ? ? ? ? ? ? ? ?Anesthesia Physical ? ?Anesthesia Plan ? ?ASA: 3 ? ?Anesthesia Plan: General  ? ?Post-op Pain Management: Ofirmev IV (intra-op)* and Dilaudid IV  ? ?Induction: Intravenous ? ?PONV Risk Score and Plan: 4 or greater and Ondansetron, Dexamethasone and Treatment may vary due to age or medical condition ? ?Airway Management Planned: LMA ? ?Additional Equipment: None ? ?Intra-op Plan:  ? ?Post-operative Plan: Extubation in OR ? ?Informed Consent: I have reviewed the patients History and Physical, chart, labs and discussed the procedure including the risks, benefits and alternatives for the proposed anesthesia with the patient or authorized representative who has indicated his/her understanding and acceptance.  ? ?Patient has DNR.  ?Discussed DNR with power of attorney and Suspend DNR. ?  ?Dental Advisory Given and Consent reviewed with POA ? ?Plan Discussed with: CRNA and Anesthesiologist ? ?Anesthesia Plan Comments: (Discussed risks of anesthesia with patient's two daughters at bedside, including PONV, sore throat, lip/dental/eye damage, post operative cognitive dysfunction. Agrees to blood transfusion. Rare risks discussed as well, such as cardiorespiratory and neurological sequelae, and allergic reactions. ?They were counseled on patient being higher risk for anesthesia due  to comorbidities: acute anemia, Afib/RVR. They were told about increased risk of cardiac and respiratory events, including death. ?  Discussed the role of CRNA in patient's perioperative care. They understand ?Agreed for periop suspension of DNR.Marland Kitchen)  ? ? ? ? ? ? ?Anesthesia Quick Evaluation ? ?

## 2021-08-14 NOTE — Progress Notes (Signed)
Initial Nutrition Assessment ? ?DOCUMENTATION CODES:  ? ?Underweight ? ?INTERVENTION:  ? ?-Once diet is advanced, add:  ? ?-Ensure Enlive po BID, each supplement provides 350 kcal and 20 grams of protein ?-MVI with minerals daily ? ?NUTRITION DIAGNOSIS:  ? ?Increased nutrient needs related to post-op healing as evidenced by estimated needs. ? ?GOAL:  ? ?Patient will meet greater than or equal to 90% of their needs ? ?MONITOR:  ? ?PO intake, Supplement acceptance, Diet advancement, Labs, Weight trends, Skin, I & O's ? ?REASON FOR ASSESSMENT:  ? ?Malnutrition Screening Tool ?  ? ?ASSESSMENT:  ? ?Kristen Osborn is a 88 year old female with past medical history atrial fibrillation, CHF, dementia, recent treatment for femur fracture with ORIF left hip 07/26/2021, presented to the ED today, 08/13/2021, after a fall at home.  Diagnosed with displaced periprosthetic fracture distal to the joint prosthesis in the left femur.  Also noted to be anemic, hemoglobin 7.6, decreased from 9.3 on 07/27/2021 (just prior to discharge from her last admission).  Also noted to be tachycardic/A-fib RVR, patient received 10 mg diltiazem in the ED and heart rate improved somewhat.  Other ED treatments include fentanyl, Zofran, normal saline 1 L.  Foley catheter was placed. ? ?Pt admitted with lt femur fracture s/p fall.  ? ?Reviewed I/O's: +550 ml x 24 hours ? ?UOP: 500 ml x 24 hours ? ?Pt unavailable at time of visit. Attempted to speak with pt via call to hospital room phone, however, unable to reach. RD unable to obtain further nutrition-related history or complete nutrition-focused physical exam at this time.   ?  ?Per orthopedics notes, plan for ORIF today. Pt is currently NPO for procedure.  ? ?Reviewed wt hx; wt has been stable over the past year.  ? ?Pt with with increased needs due to post-operative healing. Suspect pt with malnutrition, however, unable to identify at this time. Pt would greatly benefit from addition of oral nutrition  supplements.  ?  ?Medications reviewed and include colace, ferrous sulfate, and vitamin B-12. ? ?Lab Results  ?Component Value Date  ? HGBA1C 5.2 08/05/2020  ? PTA DM medications are none.  ? ?Labs reviewed: Na: 134, CBGS: 106 (inpatient orders for glycemic control are none).   ? ?Diet Order:   ?Diet Order   ? ?       ?  Diet NPO time specified  Diet effective midnight       ?  ? ?  ?  ? ?  ? ? ?EDUCATION NEEDS:  ? ?Not appropriate for education at this time ? ?Skin:  Skin Integrity Issues:: Incisions ?Incisions: closed lt hip ? ?Last BM:  Unknown ? ?Height:  ? ?Ht Readings from Last 1 Encounters:  ?08/13/21 5\' 3"  (1.6 m)  ? ? ?Weight:  ? ?Wt Readings from Last 1 Encounters:  ?08/13/21 45.4 kg  ? ? ?Ideal Body Weight:  52.3 kg ? ?BMI:  Body mass index is 17.73 kg/m?. ? ?Estimated Nutritional Needs:  ? ?Kcal:  1350-1550 ? ?Protein:  60-75 grams ? ?Fluid:  > 1.3 L ? ? ? ?10/13/21, RD, LDN, CDCES ?Registered Dietitian II ?Certified Diabetes Care and Education Specialist ?Please refer to [facility] for RD and/or RD on-call/weekend/after hours pager  ?

## 2021-08-14 NOTE — Anesthesia Postprocedure Evaluation (Signed)
Anesthesia Post Note ? ?Patient: Kristen Osborn ? ?Procedure(s) Performed: OPEN REDUCTION INTERNAL FIXATION (ORIF) DISTAL FEMUR FRACTURE (Left: Hip) ?APPLICATION OF WOUND VAC (Left: Hip) ? ?Patient location during evaluation: PACU ?Anesthesia Type: General ?Level of consciousness: awake and confused (as per preop baseline) ?Pain management: pain level controlled ?Vital Signs Assessment: post-procedure vital signs reviewed and stable ?Respiratory status: spontaneous breathing, nonlabored ventilation, respiratory function stable and patient connected to nasal cannula oxygen ?Cardiovascular status: blood pressure returned to baseline and stable ?Postop Assessment: no apparent nausea or vomiting ?Anesthetic complications: no ? ? ?No notable events documented. ? ? ?Last Vitals:  ?Vitals:  ? 08/14/21 1130 08/14/21 1145  ?BP: (!) 199/109 (!) 174/110  ?Pulse:  67  ?Resp: (!) 23 20  ?Temp: 37 ?C   ?SpO2: 91% 95%  ?  ?Last Pain:  ?Vitals:  ? 08/14/21 1100  ?TempSrc:   ?PainSc: Asleep  ? ? ?  ?  ?  ?  ?  ?  ? ?Corinda Gubler ? ? ? ? ?

## 2021-08-14 NOTE — Progress Notes (Addendum)
Cross Cover ?Dose of IV metoprolol ordered for a fib 130s. Good effect ?0424 am - depakote changed to IV since she is NPO ? ?

## 2021-08-14 NOTE — Progress Notes (Signed)
[facility]  - [place] at [facility] ? ? ?PATIENT NAME: Kristen Osborn   ? ?MR#:  [id number] ? ?DATE OF BIRTH:  1926-11-06 ? ?SUBJECTIVE:  ? ?daughters in the room. Patient has dementia currently unable to give any history review of systems. Recently discharged to go home after hip surgery. Patient had mechanical fall at home and now has distal femoral fracture ? ?VITALS:  ?Blood pressure (!) 174/110, pulse 67, temperature 98.6 ?F (37 ?C), resp. rate 20, height 5\' 3"  (1.6 m), weight 45.4 kg, SpO2 95 %. ? ?PHYSICAL EXAMINATION:  ?limited exam ?GENERAL:  88 y.o.-year-old patient lying in the bed with no acute distress.  ?LUNGS: Normal breath sounds bilaterally, ?CARDIOVASCULAR: S1, S2 normal. No murmurs, rubs, or gallops.  ?ABDOMEN: Soft, nontender, nondistended. Bowel sounds present.  ?EXTREMITIES: No  edema b/l.    ?NEUROLOGIC: nonfocal  patient is sleepy ?SKIN: No obvious rash, lesion, or ulcer.  ? ?LABORATORY PANEL:  ?CBC ?Recent Labs  ?Lab 08/14/21 ?0429  ?WBC 11.5*  ?HGB 7.0*  ?HCT 22.0*  ?PLT 189  ? ? ?Chemistries  ?Recent Labs  ?Lab 08/14/21 ?0429  ?NA 134*  ?K 4.4  ?CL 102  ?CO2 23  ?GLUCOSE 113*  ?BUN 40*  ?CREATININE 1.07*  ?CALCIUM 8.4*  ? ?Cardiac Enzymes ?No results for input(s): TROPONINI in the last 168 hours. ?RADIOLOGY:  ?DG Knee Complete 4 Views Left ? ?Result Date: 08/13/2021 ?CLINICAL DATA:  left hip pain and left knee pain EXAM: LEFT KNEE - COMPLETE 4+ VIEW COMPARISON:  Left knee radiograph 10/24/2020 FINDINGS: There is an acute periprosthetic distal femoral diaphysis fracture displaced up to 1.1 cm posteriorly. IMPRESSION: Acute periprosthetic distal femoral diaphysis fracture displaced up to 1.1 cm posteriorly. Electronically Signed   By: 10/26/2020 M.D.   On: 08/13/2021 11:52  ? ?DG C-Arm 1-60 Min-No Report ? ?Result Date: 08/14/2021 ?Fluoroscopy was utilized by the requesting physician.  No radiographic interpretation.  ? ?DG Hip Unilat With Pelvis 2-3 Views Left ? ?Result Date:  08/13/2021 ?CLINICAL DATA:  88 year old female with a history of left hip pain and left knee pain EXAM: DG HIP (WITH OR WITHOUT PELVIS) 2-3V LEFT COMPARISON:  07/24/2021, 07/11/2020, 10/24/2020, 07/26/2021 FINDINGS: Osteopenia. No acute displaced fracture. Surgical changes again demonstrated of right hip arthroplasty with cerclage fixation of the proximal femur incompletely imaged. Components appear aligned. Since the prior plain film there has been ORIF of left hip fracture, with incomplete healing/remodeling. Incomplete imaging a new femur fracture involving the IM rod with displacement at the fracture site. Vascular calcifications IMPRESSION: Surgical changes of left hip ORIF with incomplete healing/remodeling of the femoral neck. New distal femoral fracture identified, incompletely imaged on the current plain film series. A dedicated femur plain film series recommended. Redemonstration of right hip ORIF. Osteopenia Electronically Signed   By: 07/28/2021 D.O.   On: 08/13/2021 11:53  ? ?DG FEMUR MIN 2 VIEWS LEFT ? ?Result Date: 08/14/2021 ?CLINICAL DATA:  Periprosthetic femur fracture.  Status post ORIF. EXAM: LEFT FEMUR 2 VIEWS COMPARISON:  08/13/2021 FINDINGS: Interval lateral plate and screw fixation of the mid and distal femur with addition of cerclage wires. Bony alignment is markedly improved compared to the pre reduction film. Gas in the overlying soft tissues is compatible with the immediate postoperative state. IMPRESSION: Status post ORIF for periprosthetic distal femur fracture. No evidence for immediate hardware complication. Electronically Signed   By: 10/13/2021 M.D.   On: 08/14/2021 12:49  ? ?DG FEMUR MIN 2 VIEWS  LEFT ? ?Result Date: 08/14/2021 ?CLINICAL DATA:  Intraoperative fluoroscopy, intramedullary nail fixation EXAM: LEFT FEMUR 2 VIEWS; DG C-ARM 1-60 MIN-NO REPORT COMPARISON:  08/13/2021 FLUOROSCOPY: Fluoroscopy time: 1:30 Radiation dose metric not reported. FINDINGS: Intraoperative  fluoroscopic images of the left femur demonstrate cerclage wire and plate and screw reduction of perihardware fracture of the distal left femur about an intramedullary nail. IMPRESSION: Intraoperative fluoroscopic images of the left femur demonstrate cerclage wire and plate and screw reduction of perihardware fracture of the distal left femur about an intramedullary nail. Electronically Signed   By: Jearld Lesch M.D.   On: 08/14/2021 11:07  ? ?DG Femur Min 2 Views Left ? ?Result Date: 08/13/2021 ?CLINICAL DATA:  Fracture EXAM: LEFT FEMUR 2 VIEWS COMPARISON:  Cindy radiograph dated August 13, 2021 of the left knee FINDINGS: Displaced periprosthetic femoral fracture. Intertrochanteric fracture of the proximal left femur which was seen on July 24, 2021 prior exam. Acute periprosthetic distal femoral diaphysis fracture. Surrounding soft tissue edema. Vascular calcifications. IMPRESSION: Displaced periprosthetic fracture of the distal left femur. Electronically Signed   By: Allegra Lai M.D.   On: 08/13/2021 12:24   ? ?Assessment and Plan ?Kristen Osborn is a 88 year old female with past medical history atrial fibrillation, CHF, dementia, recent treatment for femur fracture with ORIF left hip 07/26/2021, presented to the ED today, 08/13/2021, after a fall at home.  Diagnosed with displaced periprosthetic fracture distal to the joint prosthesis in the left femur.  Also noted to be anemic, hemoglobin 7.6, decreased from 9.3 on 07/27/2021 (just prior to discharge from her last admission).  Also noted to be tachycardic/A-fib RVR. ? ? ?Left distal femur fracture status post mechanical ?-- advanced dementia falls frequently ?-- recently had repair of her left intertrochanteric hip fracture on 20th March ?-- patient to go for surgery this morning with Dr. Rosita Kea ?-- PRN pain meds ?-- DVT prophylaxis per ortho ? ?Anemia ?-- hgb 07/27/21 was 9.3 ?--hgb was 7.6 on 4/8--down to 7.0--1 unit BT today ? ?Hypertension ?-- PRN hydralazine ?--  resume Lopressor ? ?chronic a fib ?-- follows with Dr. Gwen Pounds. Not on any anticoagulation due to high risk fall ?-- will resume aspirin from tomorrow ?-- continue beta-blocker ? ?advanced dementia ?-- PRN IV Haldol for confusion and delirium ? ? ? ?Procedures: ?Family communication :dters in the room ?Consults :Orthopedic Dr Rosita Kea ?CODE STATUS: DNR ?DVT Prophylaxis :per ortho ?Level of care: Med-Surg ?Status is: Inpatient ?Remains inpatient appropriate because: left distal femoral fracture ?TOC for discharge plan ? ?TOTAL TIME TAKING CARE OF THIS PATIENT: 25 minutes.  ?>50% time spent on counselling and coordination of care ? ?Note: This dictation was prepared with Dragon dictation along with smaller phrase technology. Any transcriptional errors that result from this process are unintentional. ? ?Enedina Finner M.D  ? ? ?[facility]  ? ?CC: ?Primary care physician; Danella Penton, MD  ?

## 2021-08-14 NOTE — Progress Notes (Addendum)
Pt. Sustaining AF HR 110's-130's and remains very restless despite being given pain medicine. Manuela Schwartz, NP made aware.  ?

## 2021-08-14 NOTE — Progress Notes (Incomplete)
Pt resting in bed. No sign of distress. Pt without labor breathing.  ?

## 2021-08-14 NOTE — Progress Notes (Signed)
PACU note:  Pt cont to be hypertensive with pressures 170's/100's.  No new orders. Per anesthesia will reassess if pt reaches 200/120. Pt responds as per baseline with yes/no answers to questions.  ?

## 2021-08-14 NOTE — Transfer of Care (Signed)
Immediate Anesthesia Transfer of Care Note ? ?Patient: Kristen Osborn ? ?Procedure(s) Performed: OPEN REDUCTION INTERNAL FIXATION (ORIF) DISTAL FEMUR FRACTURE (Left: Hip) ?APPLICATION OF WOUND VAC (Left: Hip) ? ?Patient Location: PACU ? ?Anesthesia Type:General ? ?Level of Consciousness: sedated ? ?Airway & Oxygen Therapy: Patient Spontanous Breathing ? ?Post-op Assessment: Report given to RN ? ?Post vital signs: stable ? ?Last Vitals:  ?Vitals Value Taken Time  ?BP 165/101 08/14/21 1100  ?Temp 97   ?Pulse 99 08/14/21 1100  ?Resp 16 08/14/21 1101  ?SpO2 90 % 08/14/21 1100  ?Vitals shown include unvalidated device data. ? ?Last Pain:  ?Vitals:  ? 08/14/21 0832  ?TempSrc: Temporal  ?PainSc:   ?   ? ?  ? ?Complications: No notable events documented. ?

## 2021-08-14 NOTE — Anesthesia Procedure Notes (Signed)
Procedure Name: LMA Insertion ?Date/Time: 08/14/2021 11:03 AM ?Performed by: Derenda Mis, CRNA ?Pre-anesthesia Checklist: Patient identified ?Patient Re-evaluated:Patient Re-evaluated prior to induction ?Oxygen Delivery Method: Circle system utilized ?Preoxygenation: Pre-oxygenation with 100% oxygen ?Induction Type: IV induction ?LMA: LMA inserted ?LMA Size: 4.0 ?Number of attempts: 1 ? ? ? ? ?

## 2021-08-14 NOTE — Op Note (Signed)
08/14/2021 ? ?11:18 AM ? ?PATIENT:  Kristen Osborn  88 y.o. female ? ?PRE-OPERATIVE DIAGNOSIS:  left distal femur fracture ? ?POST-OPERATIVE DIAGNOSIS:  left distal femur fracture ? ?PROCEDURE:  Procedure(s) with comments: ?OPEN REDUCTION INTERNAL FIXATION (ORIF) DISTAL FEMUR FRACTURE (Left) ?APPLICATION OF WOUND VAC (Left) - [id number] ? ?SURGEON: Leitha Schuller, MD ? ?ASSISTANTS: None ? ?ANESTHESIA:   general ? ?EBL:  Total I/O ?In: 200 [I.V.:200] ?Out: 450 [Urine:300; Blood:150] ? ?BLOOD ADMINISTERED: 1 CC PRBC started prior to surgery ? ?DRAINS:  Incisional wound VAC   ? ?LOCAL MEDICATIONS USED:  NONE ? ?SPECIMEN:  No Specimen ? ?DISPOSITION OF SPECIMEN:  N/A ? ?COUNTS:  YES ? ?TOURNIQUET:  * No tourniquets in log * ? ?IMPLANTS: Synthes locking distal femoral plate with multiple screws locking and nonlocking with 3 cerclage wires with braided cable, 1 thin wire ? ?DICTATION: .Dragon Dictation patient was brought to the operating room and after adequate anesthesia was obtained the left leg was prepped and draped in the usual sterile fashion with a bump underneath the left buttock to internally rotate the leg.  After appropriate patient identification and timeout procedures were completed along with C arm evaluation plate length was determined and distal incision made laterally over the distal femur with IT band split and distal femur exposed.  The fracture site was exposed and is distal extent to allow for reduction with the from triangle utilized in traction the fracture could be reduced and held in place with a clamp.  Following this a cerclage wire was placed around the fracture site to maintain alignment with near anatomic alignment obtained.  This was a thin wire that could be bent down so it would not affect the plate.  The plate was then placed submuscularly and pinned into position distally at the appropriate level then proximally plate was exposed making lateral approach splitting the IT band and lifting  the vastus lateralis off the femur with plate exposure a single screw could be placed anterior to the rod getting bicortical fixation getting fixation of both ends so that the plate would not shift during remaining fixation.  All the distal screws that could be filled were with locking screws with very nice apposition of plate to bone after initially a nonlocking screw held the plate down.  After all the screws had been filled using sterile technique of placing the drill guide drilling measuring and placing the locking screw with a torque screwdriver to tighten attention was turned proximally where 3 cerclage wires were placed directly over the plate with the cramp at the level of the screw hole these were sequentially tightened crimped and then the proximal screw was tightened as well as the plate was directly on the bone and this gave additional rotational stability C arm was used during the procedure to assess alignment acceptable reduction was felt to been obtained.  The wounds were thoroughly irrigated and then closed with 0 Vicryl running to close the IT band proximally #1 Vicryl for the distal IT band 2-0 Vicryl subcutaneously staples and then incisional wound VAC. ? ?PLAN OF CARE: Admit to inpatient  ? ?PATIENT DISPOSITION:  PACU - hemodynamically stable. ?  ? ?

## 2021-08-15 DIAGNOSIS — G309 Alzheimer's disease, unspecified: Secondary | ICD-10-CM | POA: Diagnosis not present

## 2021-08-15 DIAGNOSIS — I482 Chronic atrial fibrillation, unspecified: Secondary | ICD-10-CM | POA: Diagnosis not present

## 2021-08-15 DIAGNOSIS — S7292XA Unspecified fracture of left femur, initial encounter for closed fracture: Secondary | ICD-10-CM | POA: Diagnosis not present

## 2021-08-15 DIAGNOSIS — D649 Anemia, unspecified: Secondary | ICD-10-CM | POA: Diagnosis not present

## 2021-08-15 LAB — BASIC METABOLIC PANEL
Anion gap: 6 (ref 5–15)
BUN: 34 mg/dL — ABNORMAL HIGH (ref 8–23)
CO2: 27 mmol/L (ref 22–32)
Calcium: 7.9 mg/dL — ABNORMAL LOW (ref 8.9–10.3)
Chloride: 103 mmol/L (ref 98–111)
Creatinine, Ser: 0.8 mg/dL (ref 0.44–1.00)
GFR, Estimated: >60 mL/min (ref >60)
Glucose, Bld: 136 mg/dL — ABNORMAL HIGH (ref 70–99)
Potassium: 4 mmol/L (ref 3.5–5.1)
Sodium: 136 mmol/L (ref 135–145)

## 2021-08-15 LAB — CBC
HCT: 23.6 % — ABNORMAL LOW (ref 36.0–46.0)
Hemoglobin: 7.6 g/dL — ABNORMAL LOW (ref 12.0–15.0)
MCH: 29.8 pg (ref 26.0–34.0)
MCHC: 32.2 g/dL (ref 30.0–36.0)
MCV: 92.5 fL (ref 80.0–100.0)
Platelets: 225 10*3/uL (ref 150–400)
RBC: 2.55 MIL/uL — ABNORMAL LOW (ref 3.87–5.11)
RDW: 15.2 % (ref 11.5–15.5)
WBC: 11.3 10*3/uL — ABNORMAL HIGH (ref 4.0–10.5)
nRBC: 0 % (ref 0.0–0.2)

## 2021-08-15 MED ORDER — ASPIRIN EC 81 MG PO TBEC
81.0000 mg | DELAYED_RELEASE_TABLET | Freq: Every day | ORAL | Status: DC
  Administered 2021-08-15 – 2021-08-16 (×2): 81 mg via ORAL
  Filled 2021-08-15 (×2): qty 1

## 2021-08-15 MED ORDER — DIVALPROEX SODIUM 125 MG PO DR TAB: 125.0000 mg | DELAYED_RELEASE_TABLET | Freq: Two times a day (BID) | ORAL | Status: DC

## 2021-08-15 MED ORDER — METOPROLOL TARTRATE 5 MG/5ML IV SOLN
5.0000 mg | Freq: Once | INTRAVENOUS | Status: AC
Start: 2021-08-15 — End: 2021-08-15
  Administered 2021-08-15: 5 mg via INTRAVENOUS
  Filled 2021-08-15: qty 5

## 2021-08-15 MED ORDER — DIVALPROEX SODIUM 125 MG PO DR TAB
125.0000 mg | DELAYED_RELEASE_TABLET | Freq: Two times a day (BID) | ORAL | Status: DC
  Administered 2021-08-15 – 2021-08-16 (×3): 125 mg via ORAL
  Filled 2021-08-15 (×3): qty 1

## 2021-08-15 NOTE — Plan of Care (Signed)
?  Problem: Clinical Measurements: ?Goal: Ability to maintain clinical measurements within normal limits will improve ?Outcome: Progressing ?Goal: Will remain free from infection ?Outcome: Progressing ?  ?Problem: Nutrition: ?Goal: Adequate nutrition will be maintained ?Outcome: Progressing ?  ?Problem: Elimination: ?Goal: Will not experience complications related to bowel motility ?Outcome: Progressing ?Goal: Will not experience complications related to urinary retention ?Outcome: Progressing ?  ?Problem: Safety: ?Goal: Ability to remain free from injury will improve ?Outcome: Progressing ?  ?Problem: Skin Integrity: ?Goal: Risk for impaired skin integrity will decrease ?Outcome: Progressing ?  ?

## 2021-08-15 NOTE — Plan of Care (Signed)
?  Problem: Clinical Measurements: ?Goal: Ability to maintain clinical measurements within normal limits will improve ?Outcome: Not Progressing ?Goal: Will remain free from infection ?Outcome: Not Progressing ?  ?Problem: Nutrition: ?Goal: Adequate nutrition will be maintained ?Outcome: Not Progressing ?  ?Problem: Elimination: ?Goal: Will not experience complications related to bowel motility ?Outcome: Not Progressing ?Goal: Will not experience complications related to urinary retention ?Outcome: Not Progressing ?  ?Problem: Elimination: ?Goal: Will not experience complications related to urinary retention ?Outcome: Not Progressing ?  ?Problem: Safety: ?Goal: Ability to remain free from injury will improve ?Outcome: Not Progressing ?  ?Problem: Skin Integrity: ?Goal: Risk for impaired skin integrity will decrease ?Outcome: Not Progressing ?  ?

## 2021-08-15 NOTE — Progress Notes (Signed)
?  Subjective: ?1 Day Post-Op Procedure(s) (LRB): ?OPEN REDUCTION INTERNAL FIXATION (ORIF) DISTAL FEMUR FRACTURE (Left) ?APPLICATION OF WOUND VAC (Left) ?Daughter at bedside.  ?Patient is confused but talkative. ? ?Objective: ?Vital signs in last 24 hours: ?Temp:  [97.4 ?F (36.3 ?C)-100 ?F (37.8 ?C)] 99.2 ?F (37.3 ?C) (04/09 0715) ?Pulse Rate:  [66-129] 91 (04/09 0715) ?Resp:  [16-23] 16 (04/09 0715) ?BP: (114-199)/(68-110) 131/80 (04/09 0715) ?SpO2:  [90 %-99 %] 90 % (04/09 0715) ? ?Intake/Output from previous day: ? ?Intake/Output Summary (Last 24 hours) at 08/15/2021 1041 ?Last data filed at 08/15/2021 1004 ?Gross per 24 hour  ?Intake 2611.52 ml  ?Output 850 ml  ?Net 1761.52 ml  ?  ?Intake/Output this shift: ?Total I/O ?In: 360 [P.O.:360] ?Out: -  ? ?Labs: ?Recent Labs  ?  08/13/21 ?1104 08/14/21 ?0429 08/15/21 ?0348  ?HGB 7.6* 7.0* 7.6*  ? ?Recent Labs  ?  08/14/21 ?0429 08/15/21 ?0348  ?WBC 11.5* 11.3*  ?RBC 2.37* 2.55*  ?HCT 22.0* 23.6*  ?PLT 189 225  ? ?Recent Labs  ?  08/14/21 ?0429 08/15/21 ?0348  ?NA 134* 136  ?K 4.4 4.0  ?CL 102 103  ?CO2 23 27  ?BUN 40* 34*  ?CREATININE 1.07* 0.80  ?GLUCOSE 113* 136*  ?CALCIUM 8.4* 7.9*  ? ?No results for input(s): LABPT, INR in the last 72 hours. ? ? ?EXAM ?General - Patient is  pleasantly confused.  No acute distress, though pain with repositioning ?Extremity -  patient does not allow exam of full incision, per PT entirety of Prevena is in place; no drainage in cannister ?Motor Function - unable to assess  ? ? ? ?Assessment/Plan: ?1 Day Post-Op Procedure(s) (LRB): ?OPEN REDUCTION INTERNAL FIXATION (ORIF) DISTAL FEMUR FRACTURE (Left) ?APPLICATION OF WOUND VAC (Left) ?Principal Problem: ?  Femur fracture (HCC) ?Active Problems: ?  Alzheimer's dementia without behavioral disturbance (HCC) ?  Chronic a-fib (HCC) ?  Anemia ? ?Estimated body mass index is 17.73 kg/m? as calculated from the following: ?  Height as of this encounter: 5\' 3"  (1.6 m). ?  Weight as of this encounter:  45.4 kg. ?Advance diet ?Up with therapy ? ? ? ?  ? ?DVT Prophylaxis - Lovenox and SCDs, ASA 81 ?Weight-Bearing as tolerated to left leg ? ? , PA-C ?[facility] ?08/15/2021, 10:41 AM ? ?

## 2021-08-15 NOTE — Progress Notes (Signed)
[facility]  - [place] at [facility] ? ? ?PATIENT NAME: Kristen Osborn   ? ?MR#:  [id number] ? ?DATE OF BIRTH:  04-11-1927 ? ?SUBJECTIVE:  ? ?daughters in the room. Patient has dementia currently unable to give any history review of systems. Recently discharged to go home after hip surgery. Patient had mechanical fall at home and now has distal femoral fracture ? ?POD 1 patient awake. Confused. Trying to work with PT. Daughter at bedside. ? ?VITALS:  ?Blood pressure 131/80, pulse 91, temperature 99.2 ?F (37.3 ?C), resp. rate 16, height 5\' 3"  (1.6 m), weight 45.4 kg, SpO2 90 %. ? ?PHYSICAL EXAMINATION:  ?limited exam ?GENERAL:  88 y.o.-year-old patient lying in the bed with no acute distress. Thin cachectic ?LUNGS: Normal breath sounds bilaterally, ?CARDIOVASCULAR: S1, S2 normal. No murmurs, rubs, or gallops.  ?ABDOMEN: Soft, nontender, nondistended. Bowel sounds present.  ?EXTREMITIES: No  edema b/l.    ?NEUROLOGIC: nonfocal  patient is awake, confused at baseline ?SKIN: several bruises in upper extremity ? ?LABORATORY PANEL:  ?CBC ?Recent Labs  ?Lab 08/15/21 ?0348  ?WBC 11.3*  ?HGB 7.6*  ?HCT 23.6*  ?PLT 225  ? ? ? ?Chemistries  ?Recent Labs  ?Lab 08/15/21 ?0348  ?NA 136  ?K 4.0  ?CL 103  ?CO2 27  ?GLUCOSE 136*  ?BUN 34*  ?CREATININE 0.80  ?CALCIUM 7.9*  ? ? ?Cardiac Enzymes ?No results for input(s): TROPONINI in the last 168 hours. ?RADIOLOGY:  ?DG C-Arm 1-60 Min-No Report ? ?Result Date: 08/14/2021 ?Fluoroscopy was utilized by the requesting physician.  No radiographic interpretation.  ? ?DG FEMUR MIN 2 VIEWS LEFT ? ?Result Date: 08/14/2021 ?CLINICAL DATA:  Periprosthetic femur fracture.  Status post ORIF. EXAM: LEFT FEMUR 2 VIEWS COMPARISON:  08/13/2021 FINDINGS: Interval lateral plate and screw fixation of the mid and distal femur with addition of cerclage wires. Bony alignment is markedly improved compared to the pre reduction film. Gas in the overlying soft tissues is compatible with the immediate  postoperative state. IMPRESSION: Status post ORIF for periprosthetic distal femur fracture. No evidence for immediate hardware complication. Electronically Signed   By: 10/13/2021 M.D.   On: 08/14/2021 12:49  ? ?DG FEMUR MIN 2 VIEWS LEFT ? ?Result Date: 08/14/2021 ?CLINICAL DATA:  Intraoperative fluoroscopy, intramedullary nail fixation EXAM: LEFT FEMUR 2 VIEWS; DG C-ARM 1-60 MIN-NO REPORT COMPARISON:  08/13/2021 FLUOROSCOPY: Fluoroscopy time: 1:30 Radiation dose metric not reported. FINDINGS: Intraoperative fluoroscopic images of the left femur demonstrate cerclage wire and plate and screw reduction of perihardware fracture of the distal left femur about an intramedullary nail. IMPRESSION: Intraoperative fluoroscopic images of the left femur demonstrate cerclage wire and plate and screw reduction of perihardware fracture of the distal left femur about an intramedullary nail. Electronically Signed   By: 10/13/2021 M.D.   On: 08/14/2021 11:07   ? ?Assessment and Plan ?Kristen Osborn is a 88 year old female with past medical history atrial fibrillation, CHF, dementia, recent treatment for femur fracture with ORIF left hip 07/26/2021, presented to the ED today, 08/13/2021, after a fall at home.  Diagnosed with displaced periprosthetic fracture distal to the joint prosthesis in the left femur.  Also noted to be anemic, hemoglobin 7.6, decreased from 9.3 on 07/27/2021 (just prior to discharge from her last admission).  Also noted to be tachycardic/A-fib RVR. ? ? ?Left distal femur fracture status post mechanical ?-- advanced dementia falls frequently ?-- recently had repair of her left intertrochanteric hip fracture on 20th March ?-- patient to go  for surgery this morning with Dr. Rosita Kea ?-- PRN pain meds ?-- DVT prophylaxis per ortho ?--POD#1--PT initiated--recommends HHPT ? ?Anemia ?-- hgb 07/27/21 was 9.3 ?--hgb was 7.6 on 4/8--down to 7.0--1 unit BT today--7.6 ? ?Hypertension ?-- PRN hydralazine ?-- resume  Lopressor ? ?chronic a fib ?-- follows with Dr. Gwen Pounds. Not on any anticoagulation due to high risk fall ?-- will resume aspirin from tomorrow ?-- continue beta-blocker ? ?advanced dementia ?-- PRN IV Haldol for confusion and delirium ? ?Nutrition Status: ?Nutrition Problem: Increased nutrient needs ?Etiology: post-op healing ?Signs/Symptoms: estimated needs ?Interventions: Ensure Enlive (each supplement provides 350kcal and 20 grams of protein), MVI ? ? ? ? ? ?Procedures: Left distal femur fracture surgery ?Family communication :dters in the room ?Consults :Orthopedic Dr Rosita Kea ?CODE STATUS: DNR ?DVT Prophylaxis :per ortho ?Level of care: Med-Surg ?Status is: Inpatient ?Remains inpatient appropriate because: left distal femoral fracture ?TOC for discharge plan to home with [facility]. ?Anticipate likely tomorrow ? ?TOTAL TIME TAKING CARE OF THIS PATIENT: 25 minutes.  ?>50% time spent on counselling and coordination of care ? ?Note: This dictation was prepared with Dragon dictation along with smaller phrase technology. Any transcriptional errors that result from this process are unintentional. ? ?Enedina Finner M.D  ? ? ?[facility]  ? ?CC: ?Primary care physician; Danella Penton, MD  ?

## 2021-08-15 NOTE — Evaluation (Signed)
Physical Therapy Evaluation ?Patient Details ?Name: Kristen Osborn ?MRN: [id number] ?DOB: 1926-05-22 ?Today's Date: 08/15/2021 ? ?History of Present Illness ? Pt is a 88 y.o. female presenting to hospital 4/7 s/p mechanical fall and noted to be in a-fib with RVR.  Recent hospital admission 3/18 s/p mechanical fall c/o L hip pain (s/p ORIF of displaced 2 part intertrochanteric L hip fx 07/26/21).  New imaging showing L distal femur periprosthetic fx with posterior translation of the distal femoral fx.   S/p L ORIF distal femur fx and wound vac application 08/14/21.   PMH includes dementia, a-fib on Eliquis, CHF s/p pacemaker, PAD, htn, R wrist fx 06/28/20, R hip arthroplasty 06/28/20, ORIF periprosthetic fx R 08/03/20.  ?Clinical Impression ? Prior to hospital admission, pt was ambulatory with walker and 1 assist; lives alone but has 24/7 caregiver assist from her daughters who take turns every 2 days.  Pt initially pleasant and agreeable to getting OOB but when attempting to get up pt started to get agitated, grabbing items, and resisting activity so pt assisted back to bed.  Discussed pt's status with pt's daughter who feels they can manage pt at home even if pt is at bed level (anticipate pt will do better within her own home/familiar surroundings); anticipate pt will need EMS transport home.  Pt would benefit from skilled PT to address noted impairments and functional limitations (see below for any additional details).  Upon hospital discharge, pt would benefit from HHPT and 24/7 assist.   ? ?Recommendations for follow up therapy are one component of a multi-disciplinary discharge planning process, led by the attending physician.  Recommendations may be updated based on patient status, additional functional criteria and insurance authorization. ? ?Follow Up Recommendations Home health PT ? ?  ?Assistance Recommended at Discharge Frequent or constant Supervision/Assistance  ?Patient can return home with the following ?  Two people to help with walking and/or transfers;Two people to help with bathing/dressing/bathroom;Assistance with cooking/housework;Direct supervision/assist for medications management;Assist for transportation;Help with stairs or ramp for entrance ? ?  ?Equipment Recommendations None recommended by PT (pt has needed DME at home already)  ?Recommendations for Other Services ?    ?  ?Functional Status Assessment Patient has had a recent decline in their functional status and/or demonstrates limited ability to make significant improvements in function in a reasonable and predictable amount of time  ? ?  ?Precautions / Restrictions Precautions ?Precautions: Fall ?Precaution Comments: Wound vac ?Restrictions ?Weight Bearing Restrictions: Yes ?LLE Weight Bearing: Weight bearing as tolerated  ? ?  ? ?Mobility ? Bed Mobility ?Overal bed mobility: Needs Assistance ?Bed Mobility: Supine to Sit, Sit to Supine ?  ?  ?Supine to sit: Max assist, HOB elevated ?Sit to supine: +2 for physical assistance, +2 for safety/equipment ?  ?General bed mobility comments: use of bed sheet; assist for trunk and B LE's; able to bring B LE's towards edge of bed with pt in long-sitting but deferred further mobility d/t pt starting to get agitated, grabbing items, and resisting activity (pt assisted back to bed and boosted up in bed with 2 assist using bed sheet) ?  ? ?Transfers ?  ?  ?  ?  ?  ?  ?  ?  ?  ?General transfer comment: not appropriate at this time ?  ? ?Ambulation/Gait ?  ?  ?  ?  ?  ?  ?  ?General Gait Details: not appropriate at this time ? ?Stairs ?  ?  ?  ?  ?  ? ?  Wheelchair Mobility ?  ? ?Modified Rankin (Stroke Patients Only) ?  ? ?  ? ?Balance Overall balance assessment: Needs assistance ?Sitting-balance support: Bilateral upper extremity supported (in longsitting) ?  ?Sitting balance - Comments: steady sitting in long sitting with B UE support ?  ?  ?  ?  ?  ?  ?  ?  ?  ?  ?  ?  ?  ?  ?  ?   ? ? ? ?Pertinent Vitals/Pain Pain  Assessment ?Pain Assessment: Faces ?Faces Pain Scale: Hurts a little bit ?Pain Descriptors / Indicators: Guarding, Grimacing, Shooting ?Pain Intervention(s): Limited activity within patient's tolerance, Monitored during session, Repositioned  ? ? ?Home Living Family/patient expects to be discharged to:: Private residence ?Living Arrangements: Children ?Available Help at Discharge: Family;Available 24 hours/day ?Type of Home: House ?Home Access: Stairs to enter ?Entrance Stairs-Rails: Right ?Entrance Stairs-Number of Steps: 3 (into den) ?  ?Home Layout: One level ?Home Equipment: Rollator (4 wheels);BSC/3in1;Hospital bed;Rolling Walker (2 wheels) ?Additional Comments: Pt's daughters take turns staying with pt for 2 days each (provide 24/7 assist)  ?  ?Prior Function Prior Level of Function : Needs assist ? Cognitive Assist : Mobility (cognitive);ADLs (cognitive) ?  ?  ?  ?  ?  ?Mobility Comments: Pt will walk with walker with 1 assist; frequent falls ?ADLs Comments: Daughter's assist with ADL's ?  ? ? ?Hand Dominance  ?   ? ?  ?Extremity/Trunk Assessment  ? Upper Extremity Assessment ?Upper Extremity Assessment: Difficult to assess due to impaired cognition (pt did not following cues for UE strength assessment (except eventually did perform hand grip strength assessment) but appearing fairly strong in general with activities) ?  ? ?Lower Extremity Assessment ?Lower Extremity Assessment: Difficult to assess due to impaired cognition (pt did not perform any active movement of B LE's during session) ?  ? ?Cervical / Trunk Assessment ?Cervical / Trunk Assessment: Normal  ?Communication  ? Communication: No difficulties  ?Cognition Arousal/Alertness: Awake/alert ?Behavior During Therapy: Agitated, Impulsive, Restless ?Overall Cognitive Status: History of cognitive impairments - at baseline ?  ?  ?  ?  ?  ?  ?  ?  ?  ?  ?  ?  ?  ?  ?  ?  ?General Comments: Oriented to self only; inconsistent with following 1 step  commands ?  ?  ? ?  ?General Comments  Nursing cleared pt for participation in physical therapy.  Pt and pt's daughter agreeable to PT session. ? ? ?  ?Exercises    ? ?Assessment/Plan  ?  ?PT Assessment Patient needs continued PT services  ?PT Problem List Decreased strength;Decreased activity tolerance;Decreased balance;Decreased mobility;Decreased knowledge of use of DME;Pain ? ?   ?  ?PT Treatment Interventions DME instruction;Gait training;Stair training;Functional mobility training;Therapeutic activities;Therapeutic exercise;Balance training;Patient/family education   ? ?PT Goals (Current goals can be found in the Care Plan section)  ?Acute Rehab PT Goals ?Patient Stated Goal: to be able to walk again ?PT Goal Formulation: With family ?Time For Goal Achievement: 08/29/21 ?Potential to Achieve Goals: Fair ? ?  ?Frequency 7X/week ?  ? ? ?Co-evaluation   ?  ?  ?  ?  ? ? ?  ?AM-PAC PT "6 Clicks" Mobility  ?Outcome Measure Help needed turning from your back to your side while in a flat bed without using bedrails?: A Lot ?Help needed moving from lying on your back to sitting on the side of a flat bed without using bedrails?: A Lot ?Help  needed moving to and from a bed to a chair (including a wheelchair)?: Total ?Help needed standing up from a chair using your arms (e.g., wheelchair or bedside chair)?: Total ?Help needed to walk in hospital room?: Total ?Help needed climbing 3-5 steps with a railing? : Total ?6 Click Score: 8 ? ?  ?End of Session   ?Activity Tolerance: Treatment limited secondary to agitation ?Patient left: in bed;with call bell/phone within reach;with bed alarm set;with family/visitor present;with SCD's reapplied;Other (comment) (B heels floating via pillow support) ?Nurse Communication: Mobility status;Precautions;Weight bearing status ?PT Visit Diagnosis: Other abnormalities of gait and mobility (R26.89);Muscle weakness (generalized) (M62.81);History of falling (Z91.81);Difficulty in walking, not  elsewhere classified (R26.2);Pain ?Pain - Right/Left: Left ?Pain - part of body: Hip ?  ? ?Time: [id number] ?PT Time Calculation (min) (ACUTE ONLY): 25 min ? ? ?Charges:   PT Evaluation ?$PT Eval Low Complexity:

## 2021-08-16 ENCOUNTER — Encounter: Payer: Self-pay | Admitting: Orthopedic Surgery

## 2021-08-16 DIAGNOSIS — G309 Alzheimer's disease, unspecified: Secondary | ICD-10-CM | POA: Diagnosis not present

## 2021-08-16 DIAGNOSIS — I482 Chronic atrial fibrillation, unspecified: Secondary | ICD-10-CM | POA: Diagnosis not present

## 2021-08-16 DIAGNOSIS — F0280 Dementia in other diseases classified elsewhere without behavioral disturbance: Secondary | ICD-10-CM | POA: Diagnosis not present

## 2021-08-16 DIAGNOSIS — D649 Anemia, unspecified: Secondary | ICD-10-CM | POA: Diagnosis not present

## 2021-08-16 LAB — CBC
HCT: 24.5 % — ABNORMAL LOW (ref 36.0–46.0)
Hemoglobin: 7.7 g/dL — ABNORMAL LOW (ref 12.0–15.0)
MCH: 29.5 pg (ref 26.0–34.0)
MCHC: 31.4 g/dL (ref 30.0–36.0)
MCV: 93.9 fL (ref 80.0–100.0)
Platelets: 233 10*3/uL (ref 150–400)
RBC: 2.61 MIL/uL — ABNORMAL LOW (ref 3.87–5.11)
RDW: 15.7 % — ABNORMAL HIGH (ref 11.5–15.5)
WBC: 13.3 10*3/uL — ABNORMAL HIGH (ref 4.0–10.5)
nRBC: 0 % (ref 0.0–0.2)

## 2021-08-16 MED ORDER — ENSURE ENLIVE PO LIQD: 237.0000 mL | Freq: Two times a day (BID) | ORAL | 12 refills | Status: AC

## 2021-08-16 MED ORDER — POLYETHYLENE GLYCOL 3350 17 G PO PACK
17.0000 g | PACK | Freq: Every day | ORAL | 0 refills | Status: AC | PRN
Start: 2021-08-16 — End: ?

## 2021-08-16 MED ORDER — ENOXAPARIN SODIUM 40 MG/0.4ML IJ SOSY: 40.0000 mg | PREFILLED_SYRINGE | INTRAMUSCULAR | 0 refills | Status: AC

## 2021-08-16 MED ORDER — ACETAMINOPHEN 325 MG PO TABS
325.0000 mg | ORAL_TABLET | Freq: Four times a day (QID) | ORAL | Status: AC | PRN
Start: 2021-08-16 — End: ?

## 2021-08-16 MED ORDER — ADULT MULTIVITAMIN W/MINERALS CH: 1.0000 | ORAL_TABLET | Freq: Every day | ORAL | 0 refills | Status: AC

## 2021-08-16 MED ORDER — DOCUSATE SODIUM 100 MG PO CAPS
100.0000 mg | ORAL_CAPSULE | Freq: Two times a day (BID) | ORAL | 0 refills | Status: AC
Start: 2021-08-16 — End: ?

## 2021-08-16 MED ORDER — METHOCARBAMOL 500 MG PO TABS
500.0000 mg | ORAL_TABLET | Freq: Four times a day (QID) | ORAL | 0 refills | Status: AC | PRN
Start: 2021-08-16 — End: ?

## 2021-08-16 MED ORDER — HYDROCODONE-ACETAMINOPHEN 5-325 MG PO TABS: 1.0000 | ORAL_TABLET | Freq: Four times a day (QID) | ORAL | 0 refills | Status: AC | PRN

## 2021-08-16 NOTE — TOC Initial Note (Addendum)
Transition of Care (TOC) - Initial/Assessment Note  ? ? ?Patient Details  ?Name: Kristen Osborn ?MRN: [id number] ?Date of Birth: [birth year] ? ?Transition of Care (TOC) CM/SW Contact:    ?Kenasia Scheller A Zamar Odwyer, LCSW ?Phone Number: ?08/16/2021, 9:48 AM ? ?Clinical Narrative:     CSW spoke with pt's daughter and pt's daughter states pt lives home however the daughters rotate in caring for her. Pt's daughter could not recall [facility] agency who was servicing  pt prior to admission. CSW will reach out to determine. Pt's daughter states at d/c pt will need EMS transport.           ? ?[facility] will service. ? ?Expected Discharge Plan: Home w Home Health Services ?Barriers to Discharge: Continued Medical Work up ? ? ?Patient Goals and CMS Choice ?Patient states their goals for this hospitalization and ongoing recovery are:: for pt to return home ?  ?  ? ?Expected Discharge Plan and Services ?Expected Discharge Plan: Home w Home Health Services ?In-house Referral: Clinical Social Work ?  ?Post Acute Care Choice: Home Health ?Living arrangements for the past 2 months: Single Family Home ?                ?  ?  ?  ?  ?  ?  ?  ?  ?  ?  ? ?Prior Living Arrangements/Services ?Living arrangements for the past 2 months: Single Family Home ?Lives with:: Self ?Patient language and need for interpreter reviewed:: Yes ?Do you feel safe going back to the place where you live?: Yes      ?Need for Family Participation in Patient Care: Yes (Comment) ?Care giver support system in place?: Yes (comment) ?  ?Criminal Activity/Legal Involvement Pertinent to Current Situation/Hospitalization: No - Comment as needed ? ?Activities of Daily Living ?Home Assistive Devices/Equipment: Dan Humphreys (specify type), Hospital bed, Bedside commode/3-in-1 (Since Tuesday 08/10/21 Patient has not gotten up the bed) ?ADL Screening (condition at time of admission) ?Patient's cognitive ability adequate to safely complete daily activities?: No ?Is the patient deaf or have difficulty  hearing?: No ?Does the patient have difficulty seeing, even when wearing glasses/contacts?: No ?Does the patient have difficulty concentrating, remembering, or making decisions?: Yes ?Patient able to express need for assistance with ADLs?: Yes ?Does the patient have difficulty dressing or bathing?: Yes ?Independently performs ADLs?: No ?Communication: Independent ?Dressing (OT): Needs assistance ?Is this a change from baseline?: Pre-admission baseline ?Grooming: Needs assistance ?Is this a change from baseline?: Pre-admission baseline ?Feeding: Independent ?Bathing: Needs assistance ?Is this a change from baseline?: Pre-admission baseline ?Toileting: Needs assistance ?Is this a change from baseline?: Pre-admission baseline ?In/Out Bed: Needs assistance ?Is this a change from baseline?: Pre-admission baseline (since March (since fall/surgery)) ?Walks in Home: Needs assistance (Has not walked since Tuesday 08/10/21) ?Is this a change from baseline?: Pre-admission baseline ?Does the patient have difficulty walking or climbing stairs?: Yes ?Weakness of Legs: Both ?Weakness of Arms/Hands: None ? ?Permission Sought/Granted ?Permission sought to share information with : Family Supports ?Permission granted to share information with : Yes, Release of Information Signed ? Share Information with NAME: [name] ?   ? Permission granted to share info w Relationship: daughter ?   ? ?Emotional Assessment ?Appearance:: Appears stated age ?Attitude/Demeanor/Rapport: Unable to Assess ?Affect (typically observed): Unable to Assess ?Orientation: : Oriented to Self ?Alcohol / Substance Use: Not Applicable ?Psych Involvement: No (comment) ? ?Admission diagnosis:  Femur fracture (HCC) [S72.90XA] ?Closed fracture of left femur, unspecified fracture morphology, unspecified portion of femur,  initial encounter [facility]) [S72.92XA] ?Patient Active Problem List  ? Diagnosis Date Noted  ? Femur fracture (HCC) 08/13/2021  ? Anemia 08/13/2021  ? Frequent  falls 10/24/2020  ? Closed right hip fracture, initial encounter (HCC) 08/01/2020  ? Closed left hip fracture (HCC) 07/31/2020  ? Protein-calorie malnutrition, severe 06/26/2020  ? Chronic anticoagulation 06/25/2020  ? Closed displaced fracture of right femoral neck (HCC) 06/25/2020  ? Preoperative clearance 06/25/2020  ? Distal radius fracture, right 06/25/2020  ? Closed right hip fracture (HCC) 06/25/2020  ? PAD (peripheral artery disease) (HCC) 10/17/2018  ? Status post placement of cardiac pacemaker 10/17/2018  ? Alzheimer's dementia without behavioral disturbance (HCC) 04/17/2018  ? Chronic a-fib (HCC) 04/17/2018  ? Benign essential HTN 03/20/2014  ? ?PCP:  Danella Penton, MD ?Pharmacy:   ?[facility] [address], [place] - [address] ?[phone] ?[place] [address] ?Phone: [phone] Fax: [phone] ? ? ? ? ?Social Determinants of Health (SDOH) Interventions ?  ? ?Readmission Risk Interventions ? ?  08/05/2020  ?  2:58 PM 08/04/2020  ?  1:31 PM  ?Readmission Risk Prevention Plan  ?Transportation Screening Complete Complete  ?PCP or Specialist Appt within 3-5 Days Complete Complete  ?HRI or Home Care Consult Complete Complete  ?Social Work Consult for Recovery Care Planning/Counseling Complete Complete  ?Palliative Care Screening Not Applicable Not Applicable  ?Medication Review [employer]) Complete Complete  ? ? ? ?

## 2021-08-16 NOTE — Care Management Important Message (Signed)
Important Message ? ?Patient Details  ?Name: [name] ?MRN: [id number] ?Date of Birth: 22-Oct-1926 ? ? ?Medicare Important Message Given:  Yes ? ?I reviewed the Important Message from Medicare with the patient's daughter, Legrand Pitts [phone] and she was in agreement with the discharge.  I thanked her for her time.  ? ? ?Olegario Messier A Duron Meister ?08/16/2021, 11:02 AM ?

## 2021-08-16 NOTE — Discharge Instructions (Signed)
As per Orthopedic recommendation ?

## 2021-08-16 NOTE — Progress Notes (Signed)
? ?  Subjective: ?2 Days Post-Op Procedure(s) (LRB): ?OPEN REDUCTION INTERNAL FIXATION (ORIF) DISTAL FEMUR FRACTURE (Left) ?APPLICATION OF WOUND VAC (Left) ?Patient reports pain as mild.   ?Patient is well, and has had no acute complaints or problems ?Denies any CP, SOB, ABD pain. ?We will continue therapy today.  ?Plan is to go Skilled nursing facility after hospital stay. ? ?Objective: ?Vital signs in last 24 hours: ?Temp:  [97.2 ?F (36.2 ?C)-98 ?F (36.7 ?C)] 97.2 ?F (36.2 ?C) (04/10 0729) ?Pulse Rate:  [77-108] 77 (04/10 0729) ?Resp:  [16-18] 18 (04/10 0729) ?BP: (105-130)/(56-76) 105/76 (04/10 0729) ?SpO2:  [94 %-99 %] 94 % (04/10 0729) ? ?Intake/Output from previous day: ?04/09 0701 - 04/10 0700 ?In: 480 [P.O.:480] ?Out: -  ?Intake/Output this shift: ?No intake/output data recorded. ? ?Recent Labs  ?  08/13/21 ?1104 08/14/21 ?0429 08/15/21 ?0348 08/16/21 ?3086  ?HGB 7.6* 7.0* 7.6* 7.7*  ? ?Recent Labs  ?  08/15/21 ?0348 08/16/21 ?5784  ?WBC 11.3* 13.3*  ?RBC 2.55* 2.61*  ?HCT 23.6* 24.5*  ?PLT 225 233  ? ?Recent Labs  ?  08/14/21 ?0429 08/15/21 ?0348  ?NA 134* 136  ?K 4.4 4.0  ?CL 102 103  ?CO2 23 27  ?BUN 40* 34*  ?CREATININE 1.07* 0.80  ?GLUCOSE 113* 136*  ?CALCIUM 8.4* 7.9*  ? ?No results for input(s): LABPT, INR in the last 72 hours. ? ?EXAM ?General - Patient is Alert, Appropriate, and Oriented ?Extremity - Neurovascular intact ?Sensation intact distally ?Intact pulses distally ?Dorsiflexion/Plantar flexion intact ?Dressing - dressing C/D/I and no drainage ?Motor Function - intact, moving foot and toes well on exam. , provena intact with out drainage ? ?Past Medical History:  ?Diagnosis Date  ? Atrial fibrillation (HCC)   ? CHF (congestive heart failure) (HCC)   ? Dementia (HCC)   ? ? ?Assessment/Plan:   ?2 Days Post-Op Procedure(s) (LRB): ?OPEN REDUCTION INTERNAL FIXATION (ORIF) DISTAL FEMUR FRACTURE (Left) ?APPLICATION OF WOUND VAC (Left) ?Principal Problem: ?  Femur fracture (HCC) ?Active Problems: ?   Alzheimer's dementia without behavioral disturbance (HCC) ?  Chronic a-fib (HCC) ?  Anemia ? ?Estimated body mass index is 17.73 kg/m? as calculated from the following: ?  Height as of this encounter: 5\' 3"  (1.6 m). ?  Weight as of this encounter: 45.4 kg. ?Advance diet ?Up with therapy ?VSS ?Labs stable ?Hgb 7.7, trending up. Continue to monitor ?CM to assist with discharge ? ?Follow up with [facility] in 2 weeks ?Please remove provena negative pressure dressing on 08/24/2021 and apply honey comb dressing. Keep dressing clean and dry at all times.  ? ?DVT Prophylaxis - Lovenox, TED hose, and SCDs ?Weight-Bearing as tolerated to left leg ? ? ?T. 08/26/2021, PA-C ?[facility] ?08/16/2021, 7:53 AM ?  ?

## 2021-08-16 NOTE — Discharge Summary (Addendum)
?Physician Discharge Summary ?  ?Patient: Kristen Osborn MRN: [id number] DOB: [birth year]  ?Admit date:     08/13/2021  ?Discharge date: 08/16/21  ?Discharge Physician: Enedina Finner  ? ?PCP: Danella Penton, MD  ? ?Recommendations at discharge:  ? ?follow-up with Dr. Tyson Dense in two weeks ?follow-up your PCP Dr. Hyacinth Meeker in 1 to 2 weeks ? ?Discharge Diagnoses: ?left distal femur fracture status post surgery by Dr. Rosita Kea ? ? ?Hospital Course: ? ?Ms. Kristen Osborn is a 88 year old female with past medical history atrial fibrillation, CHF, dementia, recent treatment for femur fracture with ORIF left hip 07/26/2021, presented to the ED today, 08/13/2021, after a fall at home.  Diagnosed with displaced periprosthetic fracture distal to the joint prosthesis in the left femur.  Also noted to be anemic, hemoglobin 7.6, decreased from 9.3 on 07/27/2021 (just prior to discharge from her last admission).  Also noted to be tachycardic/A-fib RVR. ?  ?  ?Left distal femur fracture status post mechanical ?-- advanced dementia falls frequently ?-- recently had repair of her left intertrochanteric hip fracture on 20th March ?-- s/p ORIF left distal femur by Dr. Rosita Kea ?-- PRN pain meds ?-- DVT prophylaxis per ortho ?--POD#1--PT initiated--recommends HHPT ?--POD #2-- overall remains the same. Okay from ortho standpoint for discharge. Patient will follow-up with orthopedic in two weeks. Dressing instructions per orthopedic. ?  ?Anemia ?-- hgb 07/27/21 was 9.3 ?--hgb was 7.6 on 4/8--down to 7.0--1 unit BT --7.7 ?--cont iron and b12 ?  ?Hypertension ?-- PRN hydralazine ?-- resume Lopressor ?  ?chronic a fib ?-- follows with Dr. Gwen Pounds. Not on any anticoagulation due to high risk fall ?-- resume aspirin  ?-- continue beta-blocker ?  ?advanced dementia ?-- PRN IV Haldol for confusion and delirium ?  ?Nutrition Status: ?Nutrition Problem: Increased nutrient needs ?Etiology: post-op healing ?Signs/Symptoms: estimated needs ?Interventions: Ensure Enlive  (each supplement provides 350kcal and 20 grams of protein), MVI ?  ?  ?  ?  ?  ?Procedures: Left distal femur fracture surgery ?Family communication :dters in the room ?Consults :Orthopedic Dr Rosita Kea ?CODE STATUS: DNR ?  ? ? ?Consultants: Dr. Rosita Kea orthopedic ?Procedures performed: open reduction internal fixation ?Disposition: Home health ?Diet recommendation:  ?Discharge Diet Orders (From admission, onward)  ? ?  Start     Ordered  ? 08/16/21 0000  Diet - low sodium heart healthy       ? 08/16/21 1053  ? ?  ?  ? ?  ? ?Regular diet ?DISCHARGE MEDICATION: ?Allergies as of 08/16/2021   ? ?   Reactions  ? Lisinopril Other (See Comments)  ? Family is unaware of this allergy   ? ?  ? ?  ?Medication List  ?  ? ?TAKE these medications   ? ?acetaminophen 325 MG tablet ?Commonly known as: TYLENOL ?Take 1-2 tablets (325-650 mg total) by mouth every 6 (six) hours as needed for mild pain (pain score 1-3 or temp > 100.5). ?  ?aspirin 81 MG EC tablet ?Take by mouth. ?  ?divalproex 125 MG DR tablet ?Commonly known as: DEPAKOTE ?Take 125 mg by mouth in the morning, at noon, and at bedtime. ?  ?docusate sodium 100 MG capsule ?Commonly known as: COLACE ?Take 1 capsule (100 mg total) by mouth 2 (two) times daily. ?  ?enoxaparin 40 MG/0.4ML injection ?Commonly known as: LOVENOX ?Inject 0.4 mLs (40 mg total) into the skin daily for 14 days. ?Start taking on: August 17, 2021 ?  ?feeding supplement Liqd ?Take 237 mLs  by mouth 2 (two) times daily between meals. ?  ?ferrous sulfate 325 (65 FE) MG tablet ?Take 1 tablet (325 mg total) by mouth daily with breakfast. ?What changed: when to take this ?  ?HYDROcodone-acetaminophen 5-325 MG tablet ?Commonly known as: NORCO/VICODIN ?Take 1 tablet by mouth every 6 (six) hours as needed for moderate pain. ?What changed: how much to take ?  ?methocarbamol 500 MG tablet ?Commonly known as: ROBAXIN ?Take 1 tablet (500 mg total) by mouth every 6 (six) hours as needed for muscle spasms. ?  ?metoprolol  tartrate 25 MG tablet ?Commonly known as: LOPRESSOR ?Take 25 mg by mouth daily. ?  ?multivitamin with minerals Tabs tablet ?Take 1 tablet by mouth daily. ?Start taking on: August 17, 2021 ?  ?omeprazole 20 MG capsule ?Commonly known as: PRILOSEC ?Take 20 mg by mouth daily. ?  ?polyethylene glycol 17 g packet ?Commonly known as: MIRALAX / GLYCOLAX ?Take 17 g by mouth daily as needed for mild constipation. ?  ?potassium chloride 8 MEQ tablet ?Commonly known as: KLOR-CON ?Take 8 mEq by mouth daily. ?  ?vitamin B-12 1000 MCG tablet ?Commonly known as: CYANOCOBALAMIN ?Take 1,000 mcg by mouth daily. ?  ? ?  ? ?  ?  ? ? ?  ?Discharge Care Instructions  ?(From admission, onward)  ?  ? ? ?  ? ?  Start     Ordered  ? 08/16/21 0000  Discharge wound care:       ?Comments: Per ORthopedic 08/14/21 1209    ?Reinforce dressing  Until discontinued      ? 08/14/21 1208  ?  08/14/21 1209    ?Negative Pressure Wound Therapy With Incisional  Until discontinued      ? 08/14/21 1208  ? 08/16/21 1053  ? ?  ?  ? ?  ? ? Follow-up Information   ? ? Evon Slack, PA-C Follow up in 2 week(s).   ?Specialties: Orthopedic Surgery, Emergency Medicine ?Contact information: ?[address] ?[place] [address] ?[phone] ? ? ?  ?  ? ? Danella Penton, MD. Schedule an appointment as soon as possible for a visit in 1 week(s).   ?Specialty: Internal Medicine ?Why: hospital f/u ?Contact information: ?[address] ?[facility] ?[place] [address] ?[phone] ? ? ?  ?  ? ?  ?  ? ?  ? ?Discharge Exam: ?Filed Weights  ? 08/13/21 1100  ?Weight: 45.4 kg  ? ? ? ?Condition at discharge: fair ? ?The results of significant diagnostics from this hospitalization (including imaging, microbiology, ancillary and laboratory) are listed below for reference.  ? ?Imaging Studies: ?DG Knee Complete 4 Views Left ? ?Result Date: 08/13/2021 ?CLINICAL DATA:  left hip pain and left knee pain EXAM: LEFT KNEE - COMPLETE 4+ VIEW  COMPARISON:  Left knee radiograph 10/24/2020 FINDINGS: There is an acute periprosthetic distal femoral diaphysis fracture displaced up to 1.1 cm posteriorly. IMPRESSION: Acute periprosthetic distal femoral diaphysis fracture displaced up to 1.1 cm posteriorly. Electronically Signed   By: Caprice Renshaw M.D.   On: 08/13/2021 11:52  ? ?DG C-Arm 1-60 Min-No Report ? ?Result Date: 08/14/2021 ?Fluoroscopy was utilized by the requesting physician.  No radiographic interpretation.  ? ?DG C-Arm 1-60 Min-No Report ? ?Result Date: 07/26/2021 ?Fluoroscopy was utilized by the requesting physician.  No radiographic interpretation.  ? ?DG Hip Unilat With Pelvis 2-3 Views Left ? ?Result Date: 08/13/2021 ?CLINICAL DATA:  88 year old female with a history of left hip pain and  left knee pain EXAM: DG HIP (WITH OR WITHOUT PELVIS) 2-3V LEFT COMPARISON:  07/24/2021, 07/11/2020, 10/24/2020, 07/26/2021 FINDINGS: Osteopenia. No acute displaced fracture. Surgical changes again demonstrated of right hip arthroplasty with cerclage fixation of the proximal femur incompletely imaged. Components appear aligned. Since the prior plain film there has been ORIF of left hip fracture, with incomplete healing/remodeling. Incomplete imaging a new femur fracture involving the IM rod with displacement at the fracture site. Vascular calcifications IMPRESSION: Surgical changes of left hip ORIF with incomplete healing/remodeling of the femoral neck. New distal femoral fracture identified, incompletely imaged on the current plain film series. A dedicated femur plain film series recommended. Redemonstration of right hip ORIF. Osteopenia Electronically Signed   By: Gilmer MorJaime  Wagner D.O.   On: 08/13/2021 11:53  ? ?DG HIP UNILAT WITH PELVIS 2-3 VIEWS LEFT ? ?Result Date: 07/26/2021 ?CLINICAL DATA:  Fluoroscopic assistance for internal fixation of fracture of left femur EXAM: DG HIP (WITH OR WITHOUT PELVIS) 2-3V LEFT COMPARISON:  07/24/2021 FINDINGS: Fluoroscopic images show  interval internal fixation of comminuted fracture of intertrochanteric fracture of proximal left femur. Fluoroscopic time was 73 seconds. IMPRESSION: Fluoroscopic assistance was provided for internal fixation of comm

## 2021-08-16 NOTE — Progress Notes (Signed)
Physical Therapy Treatment ?Patient Details ?Name: Kristen Osborn ?MRN: [id number] ?DOB: Dec 10, 1926 ?Today's Date: 08/16/2021 ? ? ?History of Present Illness Pt is a 88 y.o. female presenting to hospital 4/7 s/p mechanical fall and noted to be in a-fib with RVR.  Recent hospital admission 3/18 s/p mechanical fall c/o L hip pain (s/p ORIF of displaced 2 part intertrochanteric L hip fx 07/26/21).  New imaging showing L distal femur periprosthetic fx with posterior translation of the distal femoral fx.   S/p L ORIF distal femur fx and wound vac application 08/14/21.   PMH includes dementia, a-fib on Eliquis, CHF s/p pacemaker, PAD, htn, R wrist fx 06/28/20, R hip arthroplasty 06/28/20, ORIF periprosthetic fx R 08/03/20. ? ?  ?PT Comments  ? ? Pt resting in bed upon PT arrival with pt's daughter present.  Pt's daughter reporting pt has been asking to go to bathroom (pt still reporting wanting to get OOB to toilet) so therapist attempted transfer to [facility] to toilet.  During session pt max assist with bed mobility and close SBA for sitting balance (pt tending to lean towards R side d/t L hip pain/discomfort).  Attempted to set pt up for transfer to [facility] but pt became fearful and resistive so deferred further attempts d/t safety concerns and pt assisted back to bed.  NT came to assist with bed linen change and pt peri-care d/t bed linens noted to be wet (from urine).  Per discussion with pt's daughter, plan for pt to discharge home at bed level with 24/7 assist and HHPT (anticipate pt will do better in her own environment where she has 24/7 assist from family); plan for EMS transport home. ?  ?Recommendations for follow up therapy are one component of a multi-disciplinary discharge planning process, led by the attending physician.  Recommendations may be updated based on patient status, additional functional criteria and insurance authorization. ? ?Follow Up Recommendations ? Home health PT ?  ?  ?Assistance Recommended at Discharge  Frequent or constant Supervision/Assistance  ?Patient can return home with the following Two people to help with walking and/or transfers;Two people to help with bathing/dressing/bathroom;Assistance with cooking/housework;Direct supervision/assist for medications management;Assist for transportation;Help with stairs or ramp for entrance ?  ?Equipment Recommendations ? None recommended by PT (pt has needed DME at home already)  ?  ?Recommendations for Other Services   ? ? ?  ?Precautions / Restrictions Precautions ?Precautions: Fall ?Precaution Comments: Wound vac ?Restrictions ?Weight Bearing Restrictions: Yes ?LLE Weight Bearing: Weight bearing as tolerated  ?  ? ?Mobility ? Bed Mobility ?Overal bed mobility: Needs Assistance ?Bed Mobility: Supine to Sit, Sit to Supine, Rolling ?Rolling: Max assist ?  ?Supine to sit: Max assist, HOB elevated ?Sit to supine: Max assist, HOB elevated ?  ?General bed mobility comments: assist for trunk and B LE's; increased time to perform per pt's tolerance and participation ?  ? ?Transfers ?  ?  ?  ?  ?  ?  ?  ?  ?  ?General transfer comment: attempted to set pt up for transfer to [facility] but pt became fearful and resistive so deferred further attempts d/t safety concerns ?  ? ?Ambulation/Gait ?  ?  ?  ?  ?  ?  ?  ?General Gait Details: not appropriate at this time ? ? ?Stairs ?  ?  ?  ?  ?  ? ? ?Wheelchair Mobility ?  ? ?Modified Rankin (Stroke Patients Only) ?  ? ? ?  ?Balance Overall balance assessment:  Needs assistance ?Sitting-balance support: Bilateral upper extremity supported ?Sitting balance-Leahy Scale: Fair ?Sitting balance - Comments: steady sitting edge of bed but pt tending to lean towards R side (d/t L hip pain/discomfort) ?Postural control: Right lateral lean ?  ?  ?  ?  ?  ?  ?  ?  ?  ?  ?  ?  ?  ?  ?  ? ?  ?Cognition Arousal/Alertness: Awake/alert ?Behavior During Therapy: Agitated, Impulsive, Restless ?Overall Cognitive Status: History of cognitive impairments - at  baseline ?  ?  ?  ?  ?  ?  ?  ?  ?  ?  ?  ?  ?  ?  ?  ?  ?General Comments: Oriented to self only; inconsistent with following 1 step commands ?  ?  ? ?  ?Exercises   ? ?  ?General Comments  Nursing cleared pt for participation in physical therapy.  Pt and pt's daughter agreeable to PT session. ? ?  ?  ? ?Pertinent Vitals/Pain Pain Assessment ?Pain Assessment: Faces ?Faces Pain Scale: Hurts a little bit ?Pain Location: L hip ?Pain Descriptors / Indicators: Grimacing, Guarding ?Pain Intervention(s): Limited activity within patient's tolerance, Monitored during session, Repositioned  ? ? ?Home Living   ?  ?  ?  ?  ?  ?  ?  ?  ?  ?   ?  ?Prior Function    ?  ?  ?   ? ?PT Goals (current goals can now be found in the care plan section) Acute Rehab PT Goals ?Patient Stated Goal: to be able to walk again ?PT Goal Formulation: With family ?Time For Goal Achievement: 08/29/21 ?Potential to Achieve Goals: Fair ?Progress towards PT goals: Progressing toward goals ? ?  ?Frequency ? ? ? 7X/week ? ? ? ?  ?PT Plan Current plan remains appropriate  ? ? ?Co-evaluation   ?  ?  ?  ?  ? ?  ?AM-PAC PT "6 Clicks" Mobility   ?Outcome Measure ? Help needed turning from your back to your side while in a flat bed without using bedrails?: A Lot ?Help needed moving from lying on your back to sitting on the side of a flat bed without using bedrails?: A Lot ?Help needed moving to and from a bed to a chair (including a wheelchair)?: Total ?Help needed standing up from a chair using your arms (e.g., wheelchair or bedside chair)?: Total ?Help needed to walk in hospital room?: Total ?Help needed climbing 3-5 steps with a railing? : Total ?6 Click Score: 8 ? ?  ?End of Session Equipment Utilized During Treatment: Gait belt ?Activity Tolerance: Treatment limited secondary to agitation ?Patient left: in bed;with call bell/phone within reach;with bed alarm set;with family/visitor present;with SCD's reapplied;Other (comment) (B heels floating via pillow  support) ?Nurse Communication: Mobility status;Precautions;Weight bearing status ?PT Visit Diagnosis: Other abnormalities of gait and mobility (R26.89);Muscle weakness (generalized) (M62.81);History of falling (Z91.81);Difficulty in walking, not elsewhere classified (R26.2);Pain ?Pain - Right/Left: Left ?Pain - part of body: Hip ?  ? ? ?Time: 4076-8088 ?PT Time Calculation (min) (ACUTE ONLY): 23 min ? ?Charges:  $Therapeutic Activity: 23-37 mins          ?          ?Hendricks Limes, PT ?08/16/21, 12:22 PM ? ? ?

## 2021-08-16 NOTE — Plan of Care (Signed)
?  Problem: Clinical Measurements: ?Goal: Ability to maintain clinical measurements within normal limits will improve ?Outcome: Progressing ?Goal: Will remain free from infection ?Outcome: Progressing ?  ?Problem: Nutrition: ?Goal: Adequate nutrition will be maintained ?Outcome: Progressing ?  ?Problem: Elimination: ?Goal: Will not experience complications related to bowel motility ?Outcome: Progressing ?Goal: Will not experience complications related to urinary retention ?Outcome: Progressing ?  ?Problem: Safety: ?Goal: Ability to remain free from injury will improve ?Outcome: Progressing ?  ?Problem: Skin Integrity: ?Goal: Risk for impaired skin integrity will decrease ?Outcome: Progressing ?  ?

## 2021-08-16 NOTE — TOC Transition Note (Signed)
Transition of Care (TOC) - CM/SW Discharge Note ? ? ?Patient Details  ?Name: [name] ?MRN: [id number] ?Date of Birth: 1926/06/22 ? ?Transition of Care (TOC) CM/SW Contact:  ?Dessie Tatem A Spirit Wernli, LCSW ?Phone Number: ?08/16/2021, 12:06 PM ? ? ?Clinical Narrative:   EMS scheduled. EMS ppwk placed on chart. RN aware. ? ? ? ?  ?Barriers to Discharge: Continued Medical Work up ? ? ?Patient Goals and CMS Choice ?Patient states their goals for this hospitalization and ongoing recovery are:: for pt to return home ?  ?  ? ?Discharge Placement ?  ?           ?  ?  ?  ?  ? ?Discharge Plan and Services ?In-house Referral: Clinical Social Work ?  ?Post Acute Care Choice: Home Health          ?  ?  ?  ?  ?  ?  ?  ?  ?  ?  ? ?Social Determinants of Health (SDOH) Interventions ?  ? ? ?Readmission Risk Interventions ? ?  08/05/2020  ?  2:58 PM 08/04/2020  ?  1:31 PM  ?Readmission Risk Prevention Plan  ?Transportation Screening Complete Complete  ?PCP or Specialist Appt within 3-5 Days Complete Complete  ?HRI or Home Care Consult Complete Complete  ?Social Work Consult for Recovery Care Planning/Counseling Complete Complete  ?Palliative Care Screening Not Applicable Not Applicable  ?Medication Review Oceanographer) Complete Complete  ? ? ? ? ? ?

## 2021-08-17 LAB — TYPE AND SCREEN
ABO/RH(D): O POS
Antibody Screen: NEGATIVE
Unit division: 0
Unit division: 0
Unit division: 0

## 2021-08-17 LAB — BPAM RBC
Blood Product Expiration Date: 202305072359
Blood Product Expiration Date: 202305082359
Blood Product Expiration Date: 202305082359
ISSUE DATE / TIME: 202304080806
Unit Type and Rh: 5100
Unit Type and Rh: 5100
Unit Type and Rh: 5100

## 2021-08-19 LAB — PREPARE RBC (CROSSMATCH)

## 2021-10-13 IMAGING — CR DG PORTABLE PELVIS
1 series · 1 of 1 positions shown · non-contrast
Comparison: 07/31/2020

CLINICAL DATA: Tripped and fell, pain

EXAM:
PORTABLE PELVIS 1-2 VIEWS

[pelvis ap]
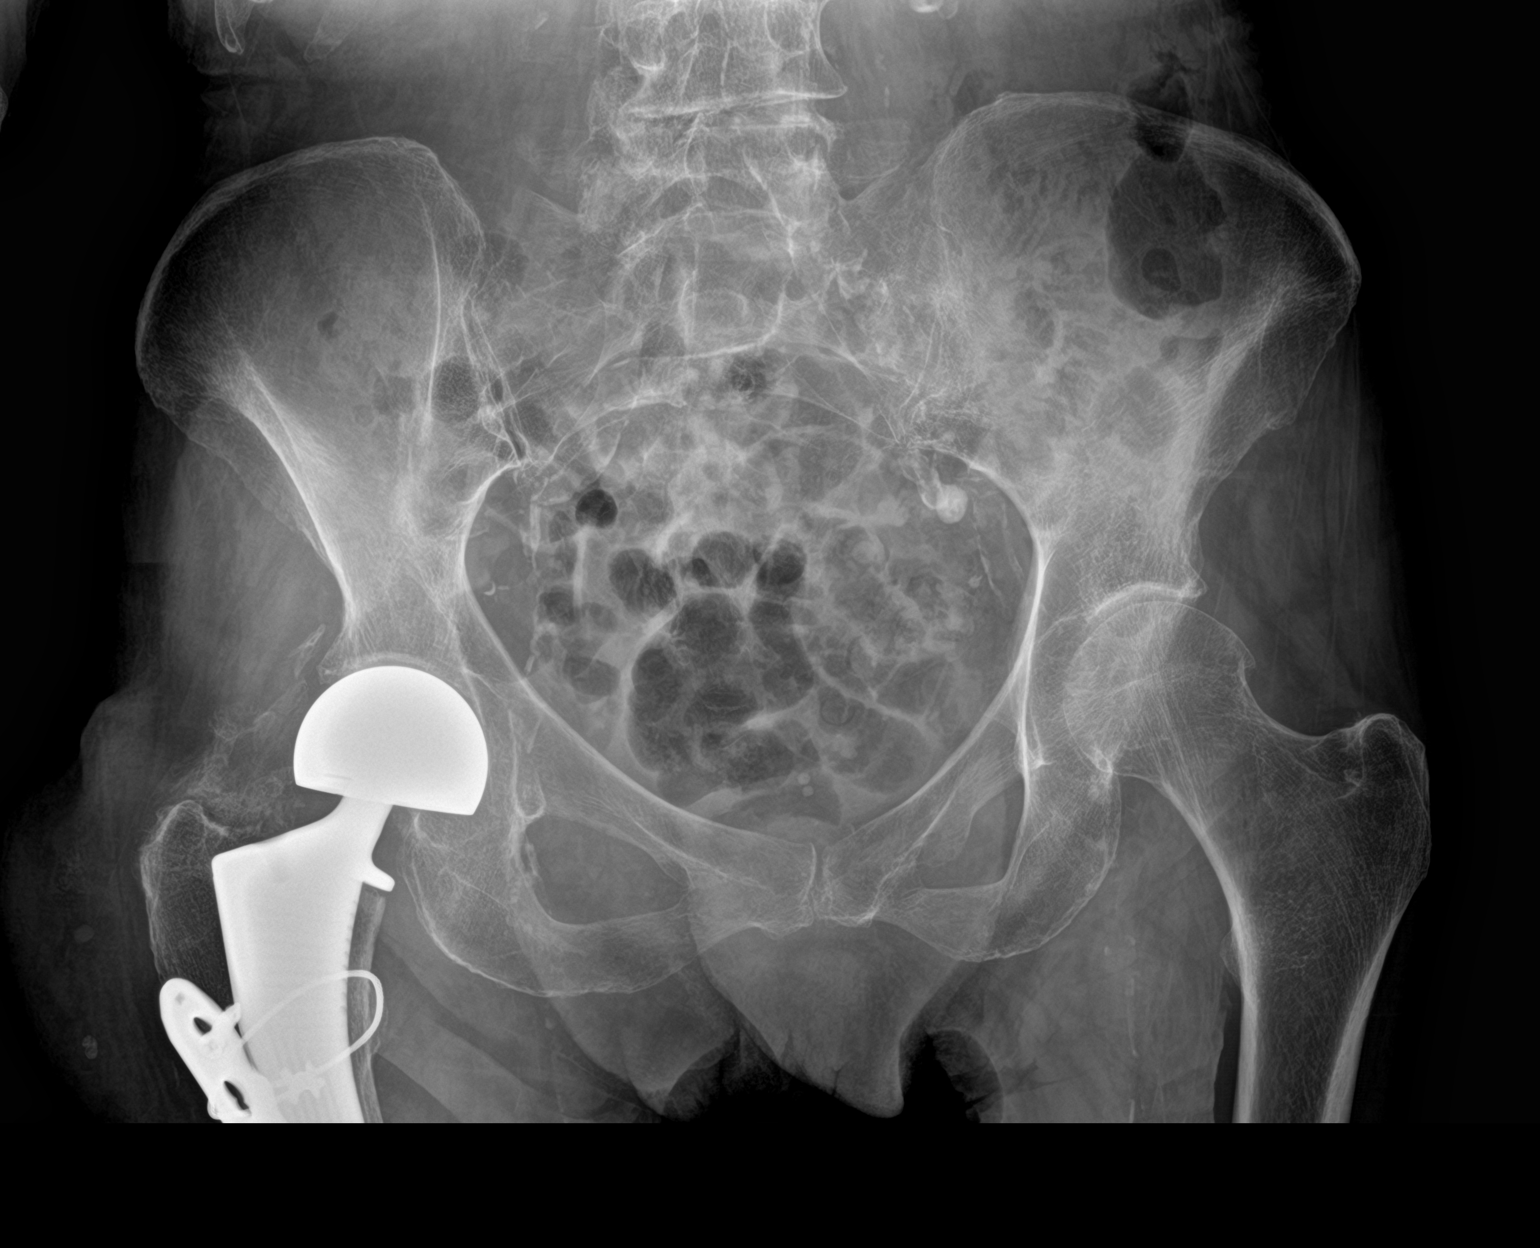

[1 of 1 positions shown; findings below may reference images not displayed]

FINDINGS: Single frontal view of the pelvis was performed. Partial
visualization of a right hip arthroplasty which appears well
aligned. Lateral plate and cerclage wire fixation of the proximal
right femur is partially visualized, consistent with prior fracture
repair. Mild left hip osteoarthritis. No acute displaced fractures.
Sacroiliac joints are normal.
IMPRESSION: 1. Postoperative changes of the right hip and femur.
2. Mild left hip osteoarthritis.
3. No acute fracture.

## 2021-10-13 IMAGING — CT CT MAXILLOFACIAL W/O CM
3 of 4 series · 16 of 47 positions shown, 19 images · non-contrast
Comparison: None.

CLINICAL DATA: Status post fall.

EXAM:
CT MAXILLOFACIAL WITHOUT CONTRAST
TECHNIQUE: Multidetector CT imaging of the maxillofacial structures was
performed. Multiplanar CT image reconstructions were also generated.

[Series 2: max soft · axial · 0.33mm/px · z∈[-198,-42]mm · 11 of 92 slices shown, 14 images]
[im 7/92  brain]
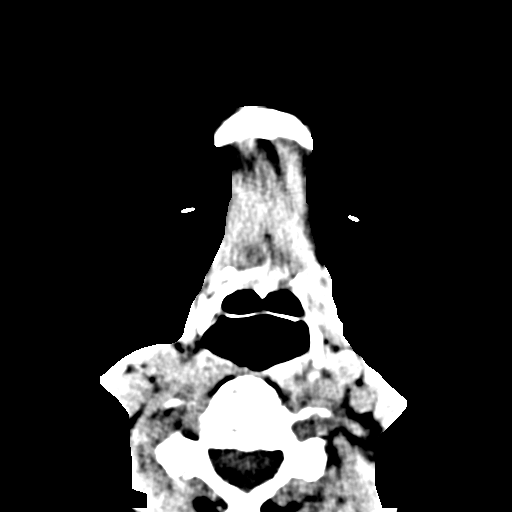
[im 7/92  bone]
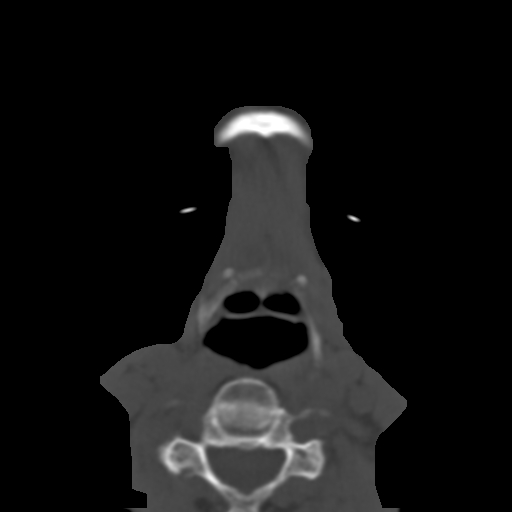
[im 13/92  bone]
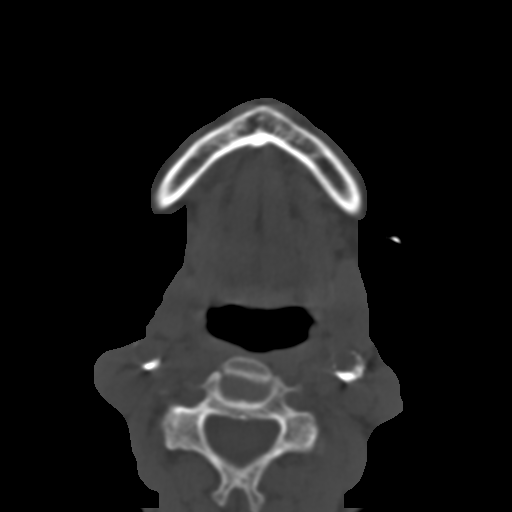
[im 22/92  bone]
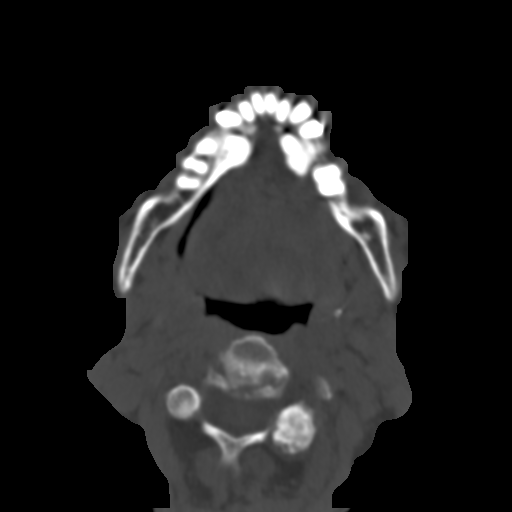
[im 29/92  bone]
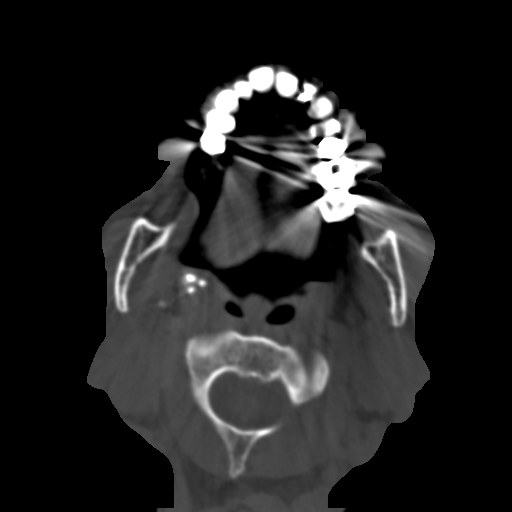
[im 38/92  brain]
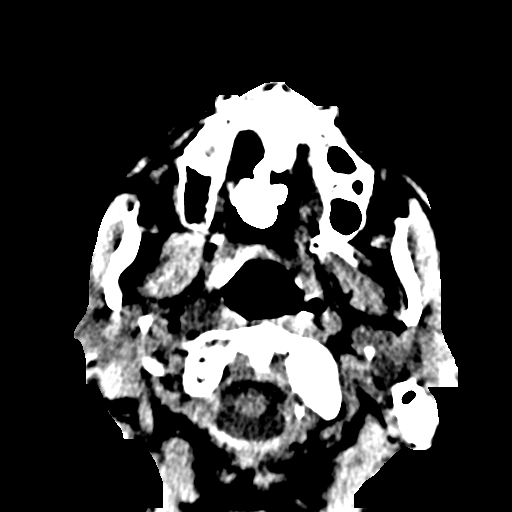
[im 38/92  bone]
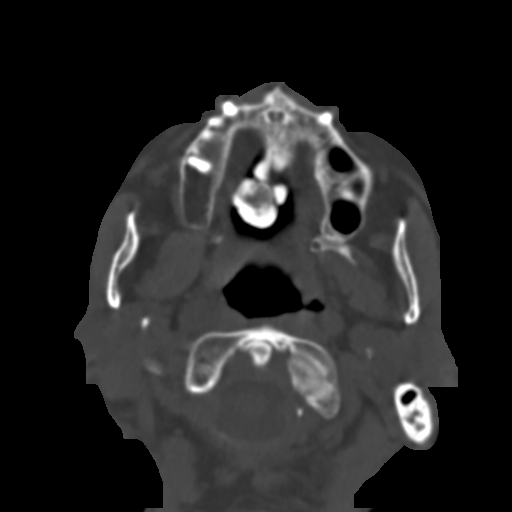
[im 48/92  bone]
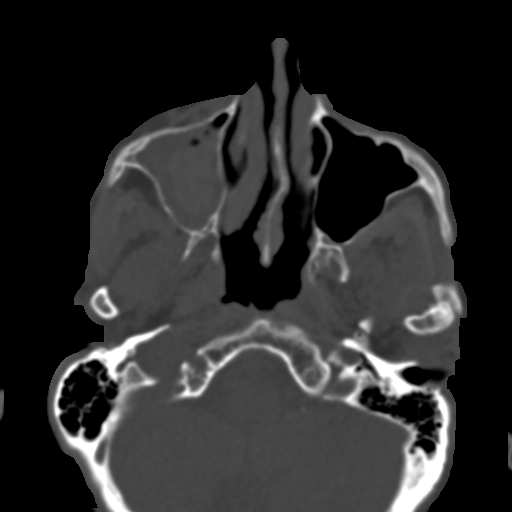
[im 54/92  bone]
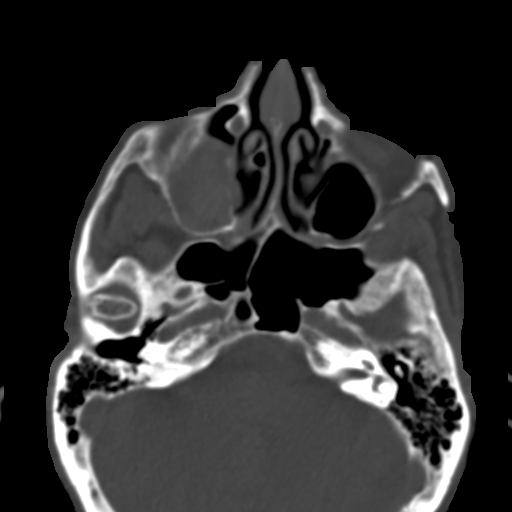
[im 63/92  bone]
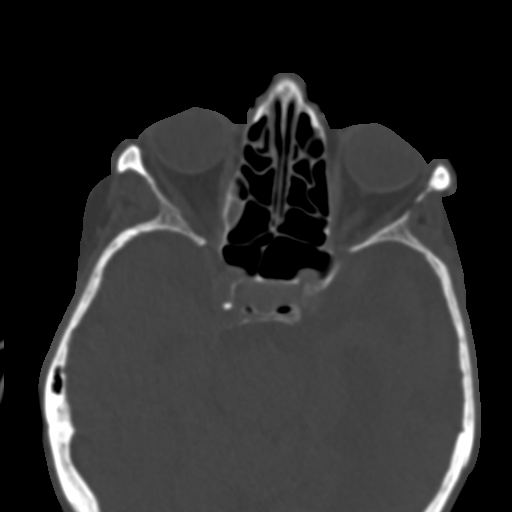
[im 70/92  brain]
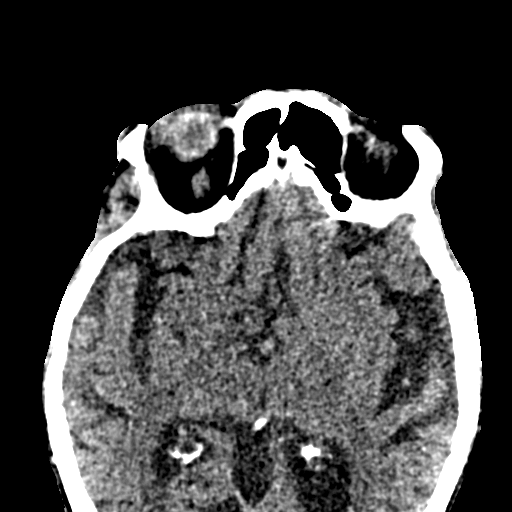
[im 70/92  bone]
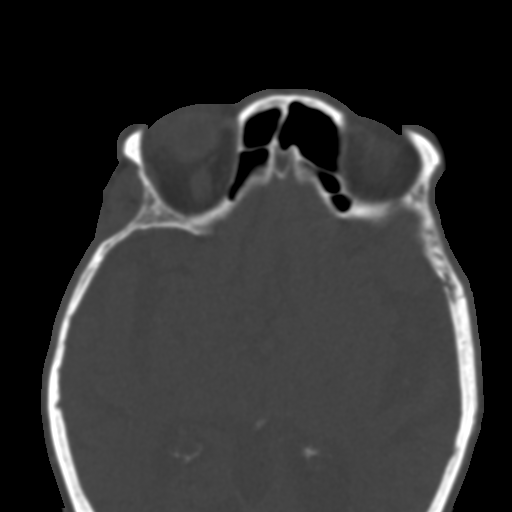
[im 79/92  bone]
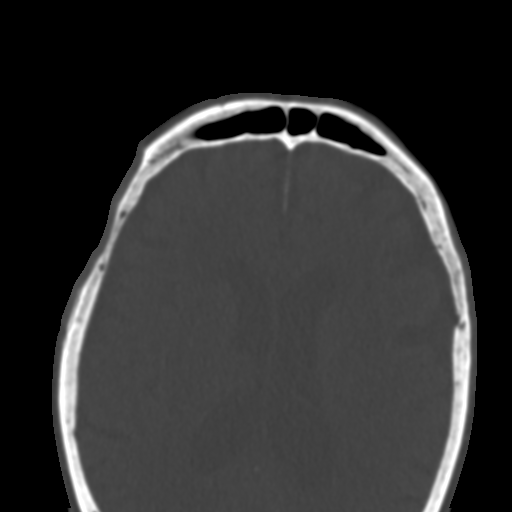
[im 85/92  bone]
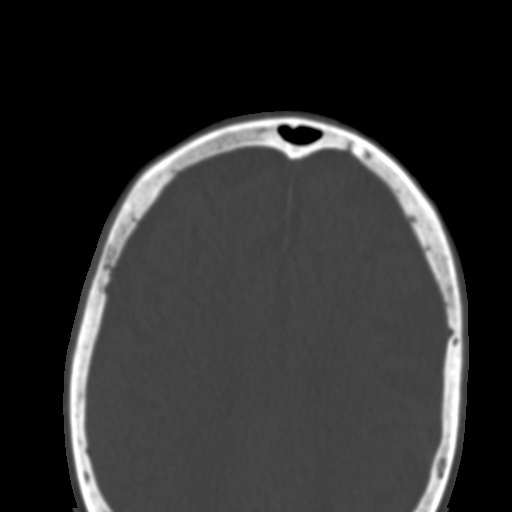

[Series 6: coronal soft · coronal · 0.36mm/px · 3 of 100 slices shown]
[im 34/100  bone]
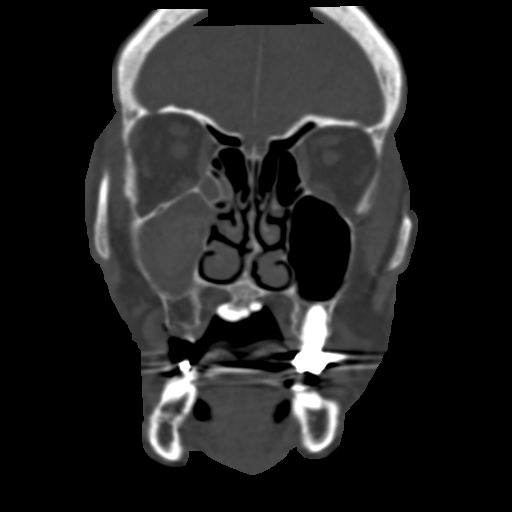
[im 45/100  bone]
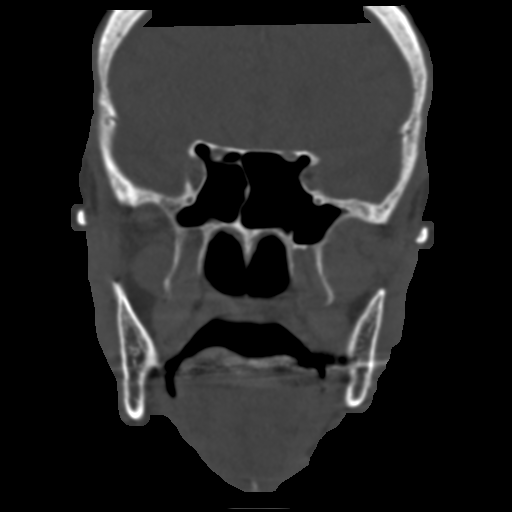
[im 56/100  bone]
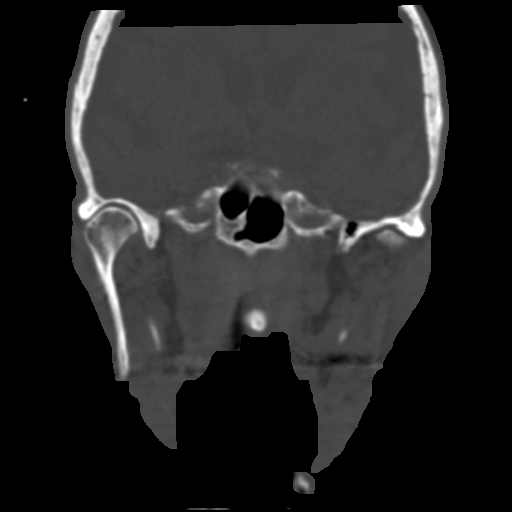

[Series 9: sagittal bone · sagittal · 0.33mm/px · 2 of 92 slices shown]
[im 31/92  bone]
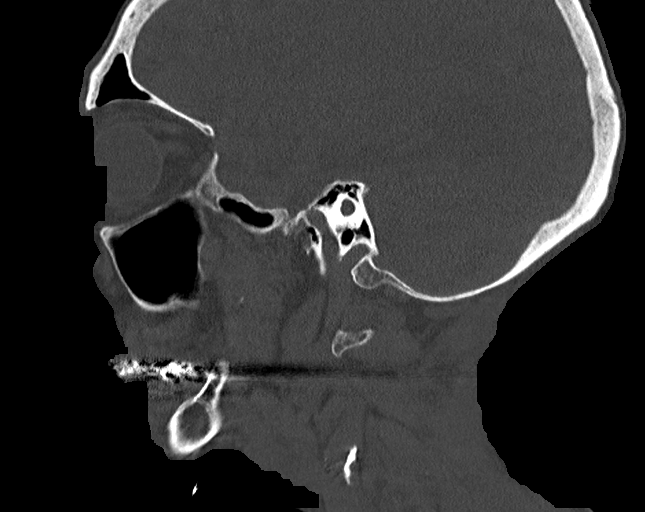
[im 61/92  bone]
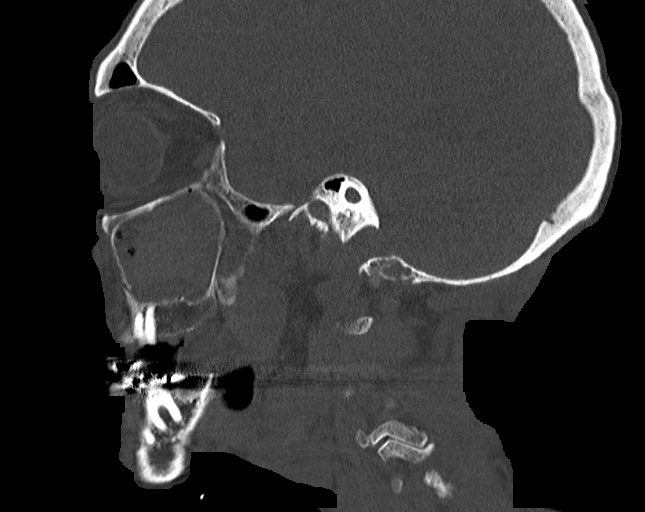

[16 of 47 positions shown; findings below may reference images not displayed]

FINDINGS: Osseous: Minimally displaced fracture of the anterior wall of the
right maxillary sinus, with hairline extension to the medial and
lateral sinus wall.

Orbits: Negative. No traumatic or inflammatory finding.

Sinuses: Hemorrhagic effusion in the right maxillary sinus, and
partially in the right ethmoid air cells.

Soft tissues: Right-sided facial swelling.
IMPRESSION: 1. Minimally displaced fracture of the anterior wall of the right
maxillary sinus, with hairline extension to the medial and lateral
sinus wall.
2. Hemorrhagic effusion in the right maxillary sinus, extending in
the right ethmoid air cells.
3. Right-sided facial swelling.

## 2021-10-13 IMAGING — CR DG KNEE COMPLETE 4+V*R*
4 series · 4 of 4 positions shown · non-contrast
Comparison: 08/03/2020

CLINICAL DATA: Tripped and fell, pain

EXAM:
RIGHT KNEE - COMPLETE 4+ VIEW

[knee ap]
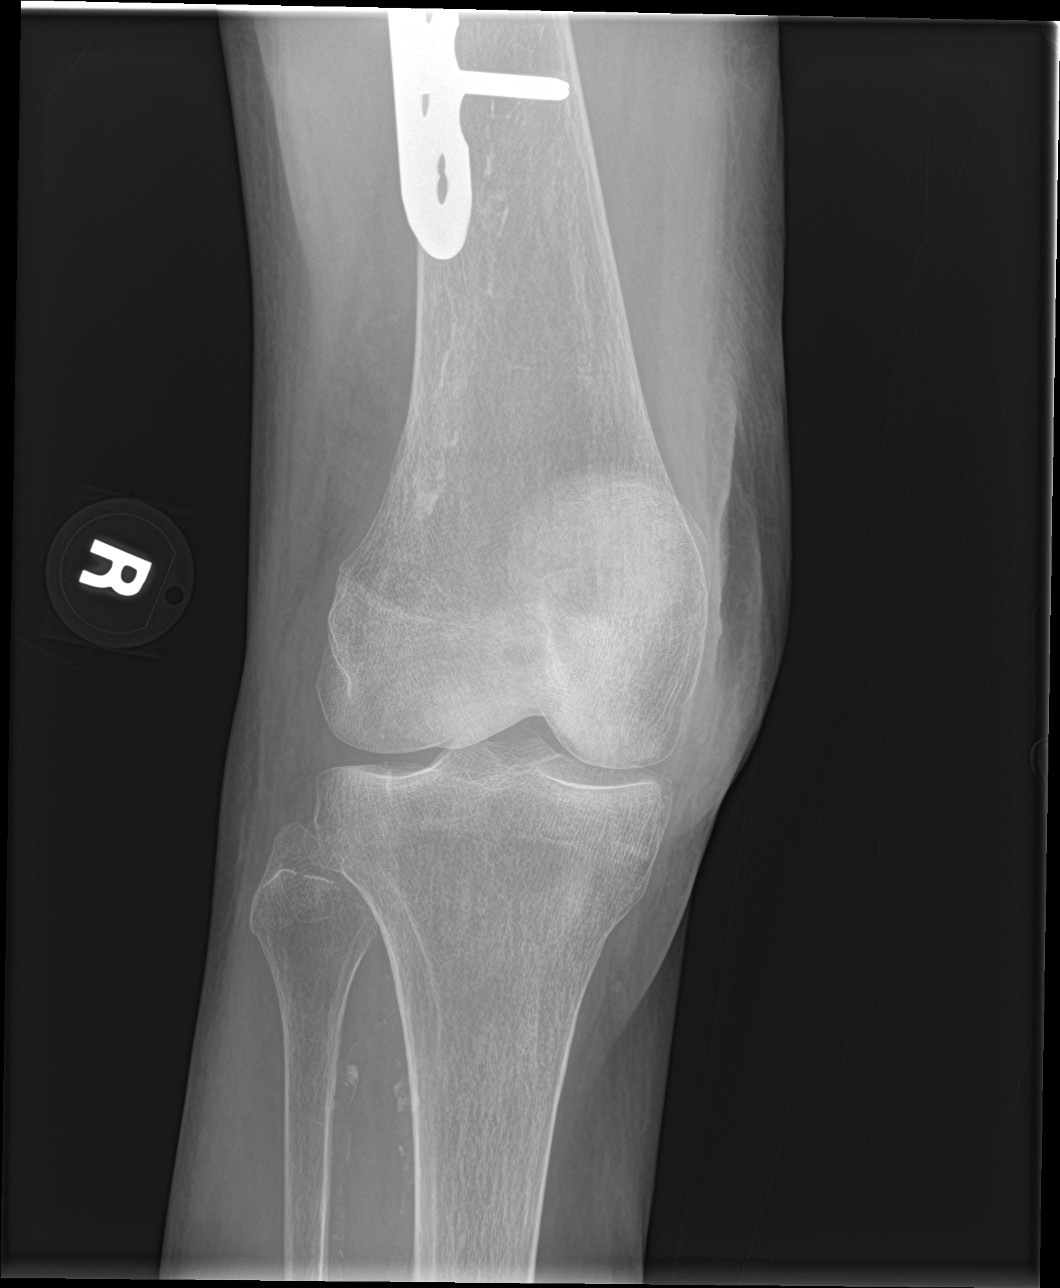

[knee obl (1 of 2)]
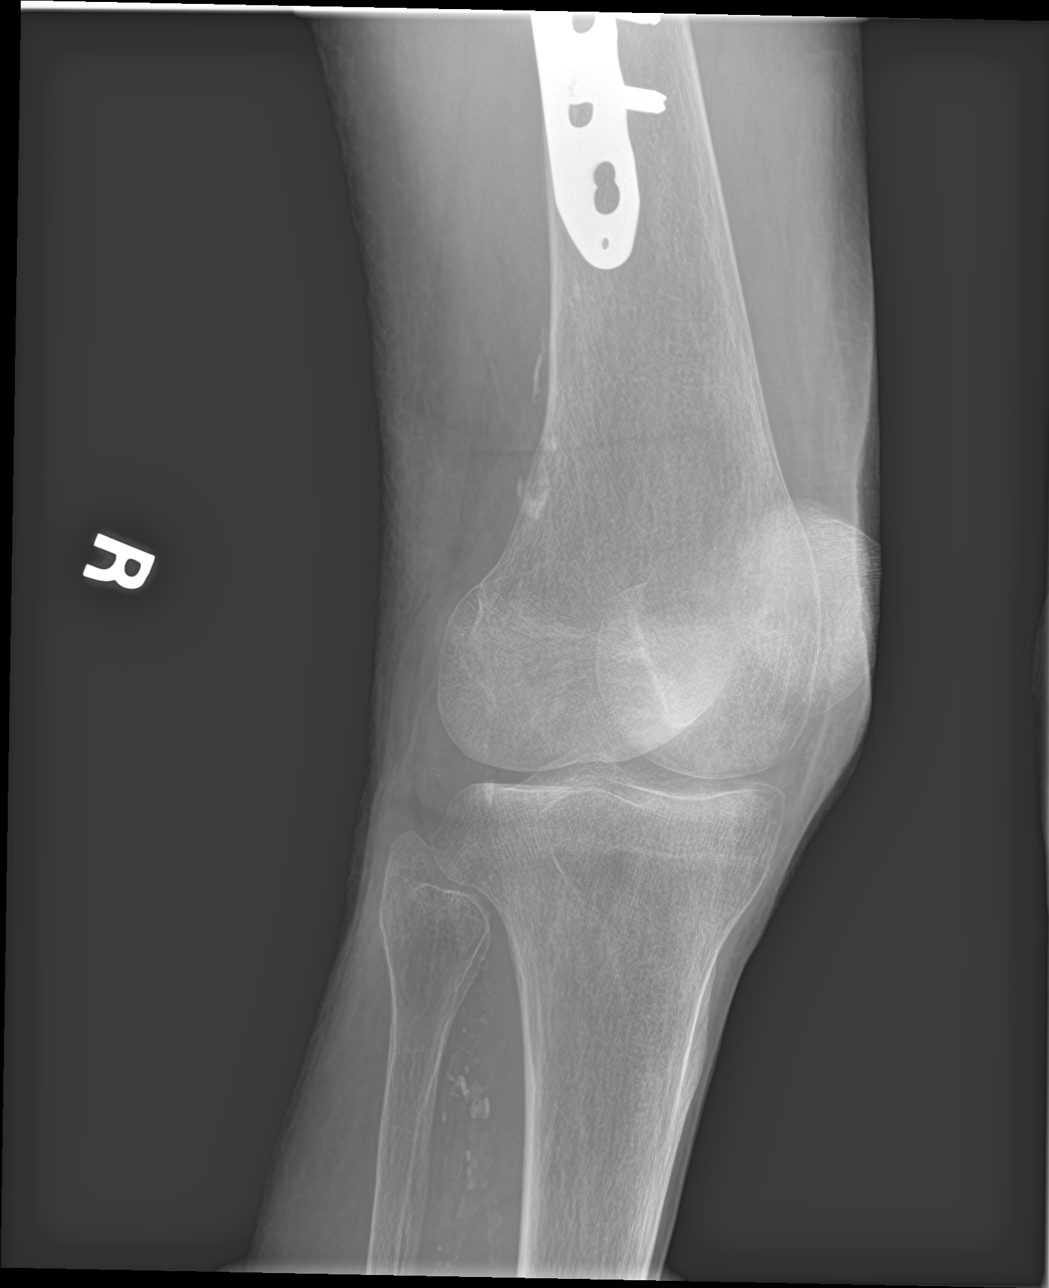

[knee obl (2 of 2)]
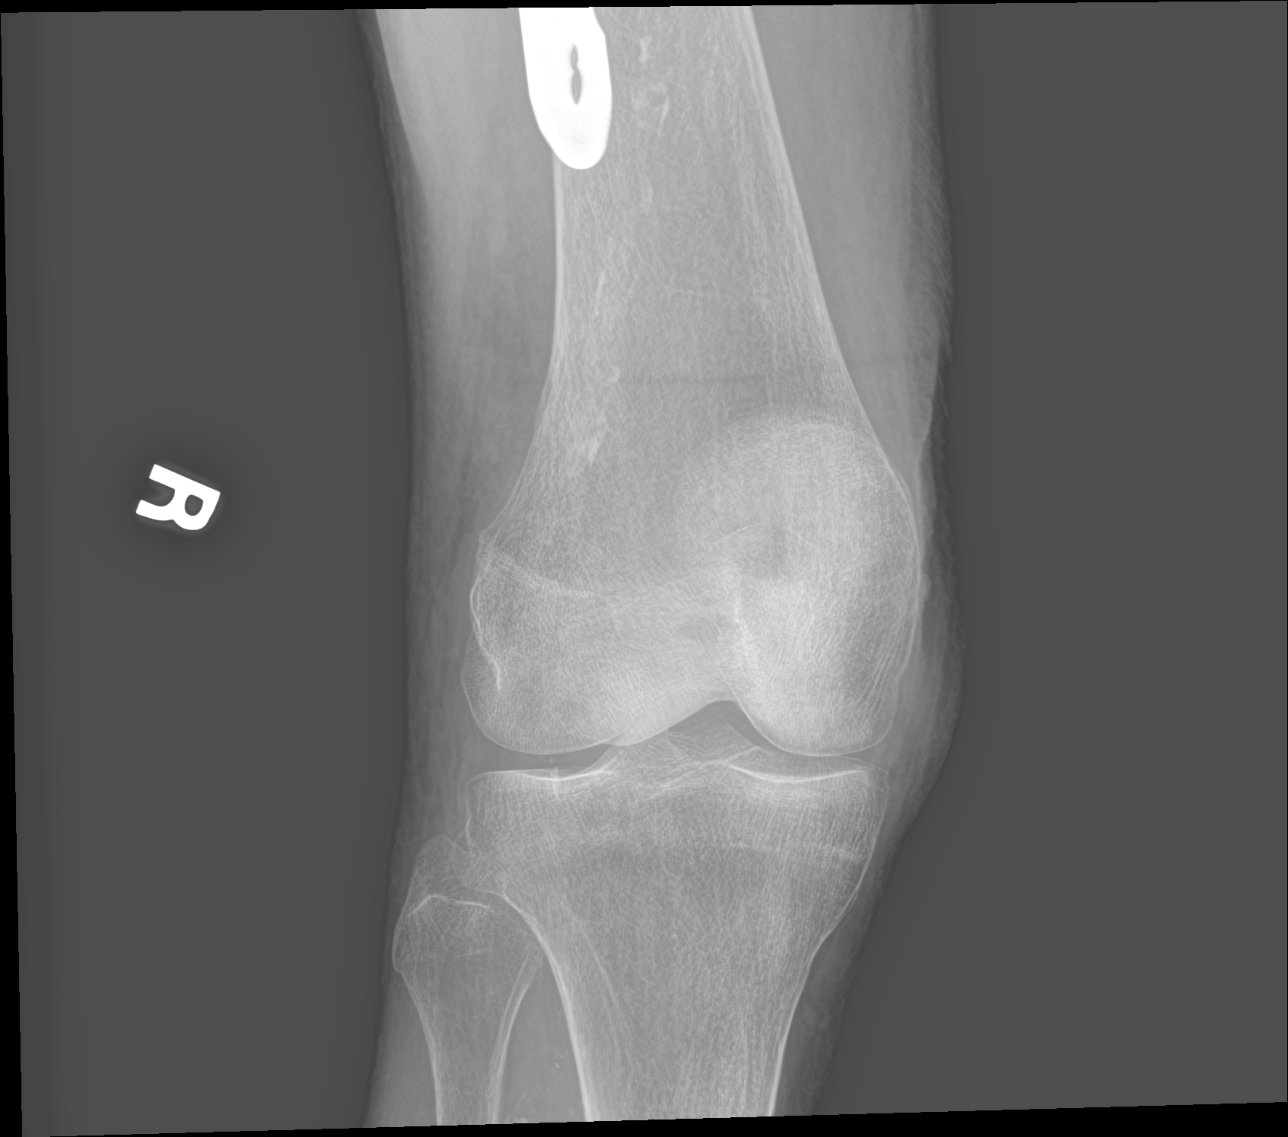

[knee lat]
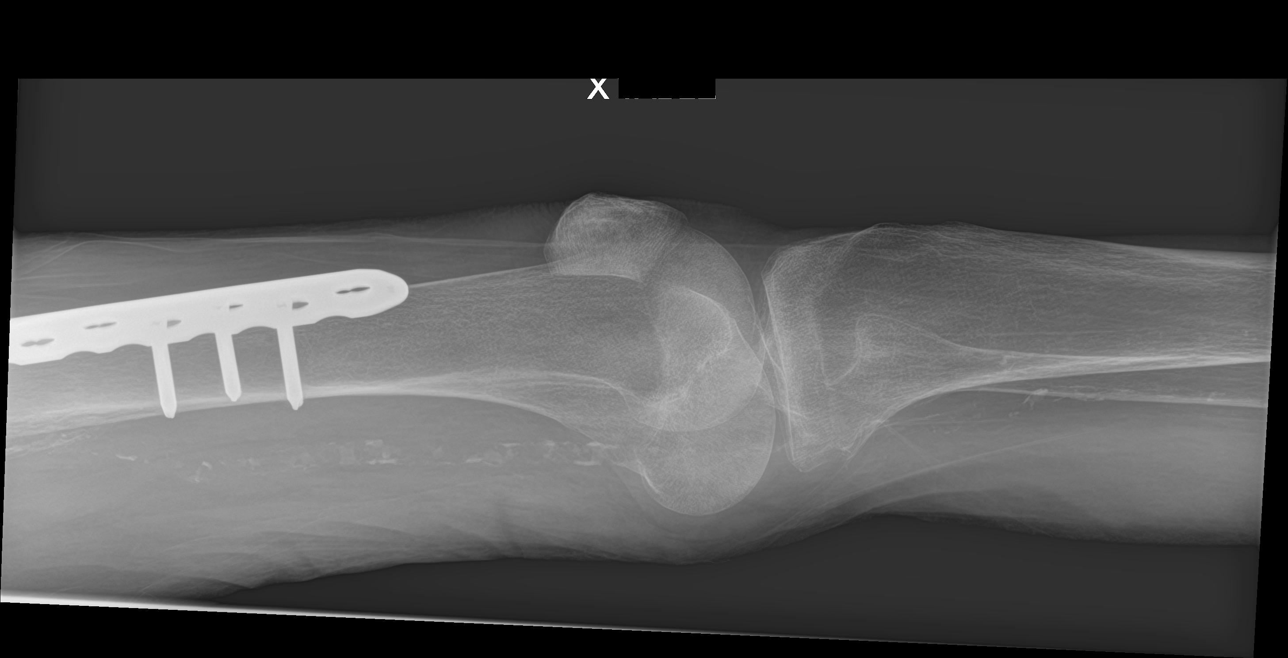

[4 of 4 positions shown; findings below may reference images not displayed]

FINDINGS: Frontal, bilateral oblique, lateral views of the right knee are
obtained. No fracture, subluxation, or dislocation. Mild medial
compartmental osteoarthritis. Distal margin of a plate and screw
fixation traversing a prior femoral fracture is noted. Soft tissues
are unremarkable. No joint effusion.
IMPRESSION: 1. Mild osteoarthritis.  No acute bony abnormality.

## 2021-11-06 DEATH — deceased
